# Patient Record
Sex: Female | Born: 2000 | Race: White | Hispanic: No | Marital: Single | Smoking: Never smoker
Health system: Southern US, Community
[De-identification: ages and names within clinical notes are randomized; demographics above are authoritative.]

## PROBLEM LIST (undated history)

## (undated) DIAGNOSIS — F419 Anxiety disorder, unspecified: Secondary | ICD-10-CM

## (undated) DIAGNOSIS — E669 Obesity, unspecified: Secondary | ICD-10-CM

## (undated) DIAGNOSIS — J309 Allergic rhinitis, unspecified: Secondary | ICD-10-CM

## (undated) DIAGNOSIS — K219 Gastro-esophageal reflux disease without esophagitis: Secondary | ICD-10-CM

## (undated) DIAGNOSIS — I499 Cardiac arrhythmia, unspecified: Secondary | ICD-10-CM

## (undated) HISTORY — DX: Gastro-esophageal reflux disease without esophagitis: K21.9

## (undated) HISTORY — DX: Obesity, unspecified: E66.9

## (undated) HISTORY — DX: Allergic rhinitis, unspecified: J30.9

## (undated) HISTORY — PX: NO PAST SURGERIES: SHX2092

## (undated) HISTORY — PX: ESOPHAGOGASTRODUODENOSCOPY: SHX1529

---
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
## 2019-02-17 ENCOUNTER — Ambulatory Visit: Payer: Medicaid Other | Admitting: Neurology

## 2019-03-03 ENCOUNTER — Ambulatory Visit (INDEPENDENT_AMBULATORY_CARE_PROVIDER_SITE_OTHER): Payer: Medicaid Other | Admitting: Neurology

## 2019-03-03 ENCOUNTER — Other Ambulatory Visit: Payer: Self-pay

## 2019-03-03 ENCOUNTER — Encounter: Payer: Self-pay | Admitting: Family Medicine

## 2019-03-03 ENCOUNTER — Encounter: Payer: Self-pay | Admitting: Neurology

## 2019-03-03 ENCOUNTER — Other Ambulatory Visit: Payer: Self-pay | Admitting: Family Medicine

## 2019-03-03 VITALS — BP 148/75 | HR 68 | Temp 97.9°F | Ht 63.0 in | Wt 260.0 lb

## 2019-03-03 DIAGNOSIS — F959 Tic disorder, unspecified: Secondary | ICD-10-CM | POA: Diagnosis not present

## 2019-03-03 DIAGNOSIS — F419 Anxiety disorder, unspecified: Secondary | ICD-10-CM | POA: Diagnosis not present

## 2019-03-03 MED ORDER — ARIPIPRAZOLE 5 MG PO TABS: 5.0000 mg | ORAL_TABLET | Freq: Every day | ORAL | 11 refills | Status: DC

## 2019-03-03 NOTE — Progress Notes (Signed)
PATIENT: Katelyn Aguilar DOB: 2000/04/09  Chief Complaint  Patient presents with  . Simple Tics/Anxiety    Feels her symptoms have improved since starting the Abilify. She has also started using a CBD vape (2000mg  daily).     HISTORICAL  Katelyn Aguilar is a 19 year old female, seen in request by her primary care physician Dr. 12 for evaluation of abnormal movement, initial evaluation was on November 17, 2018.  I have reviewed and summarized the referring note from the referring physician.  She reported intermittent involunteer body movement since 19 years old, initially it was frequent shivering-like movement, multiple episode in a day, she has no control over it, over the years,  The involuntary was replaced by forceful neck jerking either to the left or right side, she can suppress it for a while, then it will come back again, in addition, she complains of frequent headaches starting from occipital region, sometimes settled to right retro-orbital area, there is no light noise sensitivity, no nausea,  She does complains of anxiety, denies OCD, she was given muscle relaxant Robaxin 500 mg 4 times daily, which helps her some  Laboratory evaluation in 2020, negative HIV, BMP showed mild elevated glucose 104, normal CBC, negative UDS  Update March 03, 2019: She was diagnosed with possible Tourette's syndrome, was started on the Abilify 2 mg daily, which has helped her symptoms,  I personally reviewed MRI of the brain without contrast in November 2020 that was normal.  Laboratory evaluation in October 2020, mild elevated ALT 48, normal TSH, negative HIV, normal, BMP, CBC,  REVIEW OF SYSTEMS: Full 14 system review of systems performed and notable only for as above All other review of systems were negative.  ALLERGIES: No Known Allergies  HOME MEDICATIONS: Current Outpatient Medications  Medication Sig Dispense Refill  . acetaminophen (TYLENOL) 325 MG tablet Take 650 mg  by mouth every 6 (six) hours as needed.    . ARIPiprazole (ABILIFY) 2 MG tablet Take 1 tablet (2 mg total) by mouth daily. 30 tablet 11  . cetirizine (ZYRTEC) 10 MG tablet Take 1 tablet (10 mg total) by mouth daily. 30 tablet 11  . UNABLE TO FIND 2,000 mg daily. Med Name: CBD vape     No current facility-administered medications for this visit.    PAST MEDICAL HISTORY: History reviewed. No pertinent past medical history.  PAST SURGICAL HISTORY: Past Surgical History:  Procedure Laterality Date  . NO PAST SURGERIES      FAMILY HISTORY: Family History  Problem Relation Age of Onset  . COPD Mother   . Depression Mother     SOCIAL HISTORY: Social History   Socioeconomic History  . Marital status: Single    Spouse name: Not on file  . Number of children: 0  . Years of education: 27  . Highest education level: Not on file  Occupational History  . Occupation: Security job  Tobacco Use  . Smoking status: Passive Smoke Exposure - Never Smoker  . Smokeless tobacco: Never Used  Substance and Sexual Activity  . Alcohol use: No  . Drug use: No  . Sexual activity: Never  Other Topics Concern  . Not on file  Social History Narrative   Right handed    Lives with mother, step dad and brother (younger)   Caffeine use: sometimes   Social Determinants of Health   Financial Resource Strain:   . Difficulty of Paying Living Expenses: Not on file  Food Insecurity:   . Worried  About Running Out of Food in the Last Year: Not on file  . Ran Out of Food in the Last Year: Not on file  Transportation Needs:   . Lack of Transportation (Medical): Not on file  . Lack of Transportation (Non-Medical): Not on file  Physical Activity:   . Days of Exercise per Week: Not on file  . Minutes of Exercise per Session: Not on file  Stress:   . Feeling of Stress : Not on file  Social Connections:   . Frequency of Communication with Friends and Family: Not on file  . Frequency of Social  Gatherings with Friends and Family: Not on file  . Attends Religious Services: Not on file  . Active Member of Clubs or Organizations: Not on file  . Attends Banker Meetings: Not on file  . Marital Status: Not on file  Intimate Partner Violence:   . Fear of Current or Ex-Partner: Not on file  . Emotionally Abused: Not on file  . Physically Abused: Not on file  . Sexually Abused: Not on file     PHYSICAL EXAM   Vitals:   03/03/19 0834  BP: (!) 148/75  Pulse: 68  Temp: 97.9 F (36.6 C)  Weight: 260 lb (117.9 kg)  Height: 5\' 3"  (1.6 m)    Not recorded      Body mass index is 46.06 kg/m.  PHYSICAL EXAMNIATION:  Gen: NAD, conversant, well nourised, well groomed                     Cardiovascular: Regular rate rhythm, no peripheral edema, warm, nontender. Eyes: Conjunctivae clear without exudates or hemorrhage Neck: Supple, no carotid bruits. Pulmonary: Clear to auscultation bilaterally   NEUROLOGICAL EXAM:  MENTAL STATUS: Speech:    Speech is normal; fluent and spontaneous with normal comprehension.  Cognition:     Orientation to time, place and person     Normal recent and remote memory     Normal Attention span and concentration     Normal Language, naming, repeating,spontaneous speech     Fund of knowledge   CRANIAL NERVES: CN II: Visual fields are full to confrontation.  Pupils are round equal and briskly reactive to light. CN III, IV, VI: extraocular movement are normal. No ptosis. CN V: Facial sensation is intact to pinprick in all 3 divisions bilaterally. Corneal responses are intact.  CN VII: Face is symmetric with normal eye closure and smile. CN VIII: Hearing is normal to causal conversation. CN IX, X: Palate elevates symmetrically. Phonation is normal. CN XI: Head turning and shoulder shrug are intact CN XII: Tongue is midline with normal movements and no atrophy.  MOTOR: There is no pronator drift of out-stretched arms. Muscle bulk  and tone are normal. Muscle strength is normal.  REFLEXES: Reflexes are 2+ and symmetric at the biceps, triceps, knees, and ankles. Plantar responses are flexor.  SENSORY: Intact to light touch, pinprick, positional sensation and vibratory sensation are intact in fingers and toes.  COORDINATION: Rapid alternating movements and fine finger movements are intact. There is no dysmetria on finger-to-nose and heel-knee-shin.    GAIT/STANCE: Posture is normal. Gait is steady with normal steps, base, arm swing, and turning. Heel and toe walking are normal. Tandem gait is normal.  Romberg is absent.   DIAGNOSTIC DATA (LABS, IMAGING, TESTING) - I reviewed patient records, labs, notes, testing and imaging myself where available.   ASSESSMENT AND PLAN  Zariel Capano is a 19 y.o.  female    Motor tics Anxiety  Possible Tourette's syndrome  MRI of the brain was normal  Laboratory evaluation including TSH was normal  She responded well to Abilify, will titrate dose to 5 mg daily  Continue refill with her primary care physician  Marcial Pacas, M.D. Ph.D.  [facility] [address], [place], [place] [address] Ph: [phone] Fax: [phone]  CC: Zenia Resides, MD

## 2019-03-11 ENCOUNTER — Encounter: Payer: Self-pay | Admitting: Family Medicine

## 2019-03-16 ENCOUNTER — Other Ambulatory Visit: Payer: Self-pay | Admitting: Family Medicine

## 2019-03-16 DIAGNOSIS — T7840XD Allergy, unspecified, subsequent encounter: Secondary | ICD-10-CM

## 2019-03-16 NOTE — Progress Notes (Signed)
Patient requesting evaluation from allergy specialist

## 2019-03-31 ENCOUNTER — Encounter: Payer: Self-pay | Admitting: Family Medicine

## 2019-03-31 ENCOUNTER — Ambulatory Visit: Payer: Medicaid Other | Attending: Internal Medicine

## 2019-03-31 DIAGNOSIS — E669 Obesity, unspecified: Secondary | ICD-10-CM | POA: Diagnosis not present

## 2019-03-31 DIAGNOSIS — Z20822 Contact with and (suspected) exposure to covid-19: Secondary | ICD-10-CM | POA: Diagnosis not present

## 2019-03-31 DIAGNOSIS — B349 Viral infection, unspecified: Secondary | ICD-10-CM | POA: Diagnosis not present

## 2019-03-31 DIAGNOSIS — R112 Nausea with vomiting, unspecified: Secondary | ICD-10-CM | POA: Diagnosis not present

## 2019-03-31 DIAGNOSIS — R42 Dizziness and giddiness: Secondary | ICD-10-CM | POA: Diagnosis not present

## 2019-03-31 DIAGNOSIS — Z3202 Encounter for pregnancy test, result negative: Secondary | ICD-10-CM | POA: Diagnosis not present

## 2019-04-01 ENCOUNTER — Encounter: Payer: Self-pay | Admitting: Family Medicine

## 2019-04-01 LAB — NOVEL CORONAVIRUS, NAA: SARS-CoV-2, NAA: NOT DETECTED

## 2019-04-06 ENCOUNTER — Other Ambulatory Visit: Payer: Self-pay

## 2019-04-06 ENCOUNTER — Telehealth (INDEPENDENT_AMBULATORY_CARE_PROVIDER_SITE_OTHER): Payer: Medicaid Other | Admitting: Family Medicine

## 2019-04-06 DIAGNOSIS — R110 Nausea: Secondary | ICD-10-CM

## 2019-04-06 DIAGNOSIS — R112 Nausea with vomiting, unspecified: Secondary | ICD-10-CM

## 2019-04-06 HISTORY — DX: Nausea with vomiting, unspecified: R11.2

## 2019-04-06 NOTE — Progress Notes (Signed)
[facility]  Patient consented to have virtual visit. Method of visit: Video  Encounter participants: Patient: Katelyn Aguilar - located at [facility] parking lot in the car Provider: Lavonda Jumbo - located at [facility] Others (if applicable): Dr. Oda Cogan  Chief Complaint: Nausea and Vomiting  HPI: [name] is a 19 yo female that presents for Nausea and vomiting that has been occurring for months. She recently sought care at an urgent care 03/31/2019 for COVID symptoms as she also had cough and congestion. Her test was negative. There they suggested possible insulin resistance due to an exam finding of acanthosis nigrans. She was given Zofran that give her some relief especially on the weekends. Her last emesis episode was 04/01/2019 and was not brown, black, or green. Before emesis she has abdominal pain that lingers afterwards but not always. She does not currently have abdominal pain. With the nausea she occasionally has dizziness. Today she continues to have runny nose, cough, and congestion but attributes this to her allergies in which she takes Zyrtec for but did not take it this morning. She does not have associated diarrhea and does not believed she consumed any undercooked food. LMP 03/15/2019 she is sexually active and last UPT was 03/31/2019 and was negative. No personal history but family history of GERD. She does not believe the nausea is related to nervousness or being anxious.  ROS: per HPI  Pertinent PMHx: Tics, anxiety  Exam:  General: Appears well, no acute distress. Age appropriate. Respiratory: Speaking in full sentences.  Assessment/Plan:  Nausea Etiology unknown at this time but will consider infectious causes (viral gastroenteritis or COVID although low suspicion for both at this time), pregnancy (sexually active next menstrual period due 04/16/2019, recent negative UPT), psychiatric causes (history of anxiety, potential GI derangements  with increasing levels of anxiety), PUD/GERD (although no obvious association with food), endocrine causes (w/ possible acanthosis nigrans) -Scheduled for office visit next Monday 04/13/2019 -Follow up on symptom improvement -Consider A1c and/or other endorcrine labs -Assess GAD score -ED precautions given    Time spent during visit with patient: 25 minutes

## 2019-04-06 NOTE — Assessment & Plan Note (Addendum)
Etiology unknown at this time but will consider infectious causes (viral gastroenteritis or COVID although low suspicion for both at this time), pregnancy (sexually active next menstrual period due 04/16/2019, recent negative UPT), psychiatric causes (history of anxiety, potential GI derangements with increasing levels of anxiety), PUD/GERD (although no obvious association with food), endocrine causes (w/ possible acanthosis nigrans) -Scheduled for office visit next Monday 04/13/2019 -Follow up on symptom improvement -Consider A1c and/or other endorcrine labs -Assess GAD score -ED precautions given

## 2019-04-06 NOTE — Patient Instructions (Addendum)
It was very nice to meet you today. Please enjoy the rest of your week.   Continue the Zofran as needed. Go to the ED with increased belly pain or vomiting that is black, green, or bloody.  Your appointment is this Friday March 12 @ 9:10am with Dr. Darin Engels.  Please call the clinic at [phone] if your symptoms worsen or you have any concerns. It was our pleasure to serve you.

## 2019-04-10 ENCOUNTER — Encounter: Payer: Self-pay | Admitting: Family Medicine

## 2019-04-10 ENCOUNTER — Telehealth: Payer: Self-pay | Admitting: *Deleted

## 2019-04-10 ENCOUNTER — Ambulatory Visit (INDEPENDENT_AMBULATORY_CARE_PROVIDER_SITE_OTHER): Payer: Medicaid Other | Admitting: Family Medicine

## 2019-04-10 ENCOUNTER — Other Ambulatory Visit: Payer: Self-pay

## 2019-04-10 VITALS — BP 116/70 | HR 99 | Ht 63.0 in | Wt 275.4 lb

## 2019-04-10 DIAGNOSIS — R110 Nausea: Secondary | ICD-10-CM

## 2019-04-10 DIAGNOSIS — E669 Obesity, unspecified: Secondary | ICD-10-CM | POA: Diagnosis not present

## 2019-04-10 DIAGNOSIS — R252 Cramp and spasm: Secondary | ICD-10-CM | POA: Diagnosis not present

## 2019-04-10 LAB — POCT GLYCOSYLATED HEMOGLOBIN (HGB A1C): Hemoglobin A1C: 5.1 % (ref 4.0–5.6)

## 2019-04-10 LAB — POCT URINE PREGNANCY: Preg Test, Ur: NEGATIVE

## 2019-04-10 NOTE — Telephone Encounter (Signed)
Pt informed. Kona Yusuf, CMA  

## 2019-04-10 NOTE — Telephone Encounter (Signed)
-----   Message from Oralia Manis, DO sent at 04/10/2019 11:41 AM EST ----- Please inform patient that results of urine preg and A1C are negative.

## 2019-04-10 NOTE — Patient Instructions (Signed)
It was a pleasure seeing you today.   Today we discussed nausea & vomiting, cramps  For your medical concerns: I have obtained labs. I will either call or send a mychart message with the results. Please follow up in 2 weeks to make sure we do not need further workup and to see how your symptoms are doing.   Please follow up in 2 weeks or sooner if symptoms persist or worsen. Please call the clinic immediately if you have any concerns.   Our clinic's number is [phone]. Please call with questions or concerns.   Thank you,  Oralia Manis, DO

## 2019-04-10 NOTE — Assessment & Plan Note (Signed)
Unclear etiology at this time. Will obtain BMP to r/o electrolyte abnormality. Will obtain CBC to r/o anemia. Encouraged water intake. Advised conservative measures. F/u with PCP if no improvement.

## 2019-04-10 NOTE — Assessment & Plan Note (Signed)
Patient evaluated for nausea vomiting by both urgent care follow-up with Dr. Frederik Pear virtual visit.  Urine pregnancy test negative.  A1c within normal limits. Will obtain BMP to r/o metabolic pathology. Will also obtain CBC and TSH. Can consider GERD, may benefit from trial of PPI vs. H2 blocker. Can also consider psychogenic given h/o anxiety. Advised to f/u with PCP within 2 weeks.

## 2019-04-10 NOTE — Progress Notes (Signed)
    SUBJECTIVE:   CHIEF COMPLAINT / HPI:   Nausea/vomiting  Today for follow-up of nausea and vomiting.  States that she went to urgent care on 3/2 and was told that she has insulin resistance.  Was told to see her PCP ASAP if she might have diabetes versus PCOS.  Patient reports family history of diabetes in her maternal grandmother.  No other family members that she knows of.  Denies history of any pregnancy so no diagnosed with gestational diabetes.  Does eat lots of sweets.  Did check her sugar at home and it was 85 when she thought she was hypoglycemic.  States that sometimes she gets dizzy spells where she feels hypoglycemic, eats sweets, feels better afterwards.  Reports her nausea is improved.  Is concerned because she has a dark ring around her neck.  Does have increased facial hair, this is throughout her family.  Does have weight gain but states that she has stopped exercising as well.  She works very long hours and is unable to exercise.  Cramping Patient reports intermittent muscle cramping.  They occur "everywhere" they can be very random.  States that they are sometimes in her arms, toes, torso.  No inciting events that she knows of.  Has been occurring for the past few months.  Usually every day.  But can occur at random times.  Has tried to increase her banana intake, that did not help.  She mostly drinks water, at least 64 ounces a day.  Does sweat a lot from her previous job but is no longer working there.  Contraception Patient reports unprotected intercourse in the past 2 weeks.  Denies any use of birth control.  Only uses condoms.  States she is not interested in any birth control at this time.  PERTINENT  PMH / PSH: anxiety, involuntary movements, nausea  OBJECTIVE:   BP 116/70   Pulse 99   Ht 5\' 3"  (1.6 m)   Wt 275 lb 6.4 oz (124.9 kg)   LMP 04/09/2019 (Exact Date)   SpO2 97%   BMI 48.78 kg/m   Gen: awake and alert, NAD Cardio: RRR, no MRG Resp: CTAB, no wheezes,  rales, or rhonchi GI: soft, non tender, non distended  Ext: no edema   ASSESSMENT/PLAN:   Nausea Patient evaluated for nausea vomiting by both urgent care follow-up with Dr. 06/09/2019 virtual visit.  Urine pregnancy test negative.  A1c within normal limits. Will obtain BMP to r/o metabolic pathology. Will also obtain CBC and TSH. Can consider GERD, may benefit from trial of PPI vs. H2 blocker. Can also consider psychogenic given h/o anxiety. Advised to f/u with PCP within 2 weeks.   Cramp in limb Unclear etiology at this time. Will obtain BMP to r/o electrolyte abnormality. Will obtain CBC to r/o anemia. Encouraged water intake. Advised conservative measures. F/u with PCP if no improvement.      Frederik Pear, DO  PGY-3 [facility]

## 2019-04-11 LAB — BASIC METABOLIC PANEL
BUN/Creatinine Ratio: 19 (ref 9–23)
BUN: 14 mg/dL (ref 6–20)
CO2: 19 mmol/L — ABNORMAL LOW (ref 20–29)
Calcium: 9.4 mg/dL (ref 8.7–10.2)
Chloride: 102 mmol/L (ref 96–106)
Creatinine, Ser: 0.75 mg/dL (ref 0.57–1.00)
GFR calc Af Amer: 133 mL/min/{1.73_m2} (ref >59)
GFR calc non Af Amer: 115 mL/min/{1.73_m2} (ref >59)
Glucose: 95 mg/dL (ref 65–99)
Potassium: 4.4 mmol/L (ref 3.5–5.2)
Sodium: 139 mmol/L (ref 134–144)

## 2019-04-11 LAB — CBC
Hematocrit: 41.5 % (ref 34.0–46.6)
Hemoglobin: 13.4 g/dL (ref 11.1–15.9)
MCH: 29 pg (ref 26.6–33.0)
MCHC: 32.3 g/dL (ref 31.5–35.7)
MCV: 90 fL (ref 79–97)
Platelets: 281 10*3/uL (ref 150–450)
RBC: 4.62 x10E6/uL (ref 3.77–5.28)
RDW: 13 % (ref 11.7–15.4)
WBC: 9.2 10*3/uL (ref 3.4–10.8)

## 2019-04-11 LAB — TSH: TSH: 1.88 u[IU]/mL (ref 0.450–4.500)

## 2019-04-13 ENCOUNTER — Other Ambulatory Visit: Payer: Self-pay

## 2019-04-13 ENCOUNTER — Encounter: Payer: Self-pay | Admitting: Family Medicine

## 2019-04-13 ENCOUNTER — Emergency Department (HOSPITAL_COMMUNITY)
Admission: EM | Admit: 2019-04-13 | Discharge: 2019-04-14 | Disposition: A | Discharge status: Home / Self Care | Payer: Medicaid Other | Attending: Emergency Medicine | Admitting: Emergency Medicine

## 2019-04-13 ENCOUNTER — Ambulatory Visit: Payer: Medicaid Other | Admitting: Allergy

## 2019-04-13 ENCOUNTER — Ambulatory Visit (INDEPENDENT_AMBULATORY_CARE_PROVIDER_SITE_OTHER)
Admission: RE | Admit: 2019-04-13 | Discharge: 2019-04-13 | Disposition: A | Discharge status: Home / Self Care | Payer: Medicaid Other | Source: Ambulatory Visit

## 2019-04-13 ENCOUNTER — Other Ambulatory Visit: Payer: Self-pay | Admitting: Family Medicine

## 2019-04-13 ENCOUNTER — Encounter (HOSPITAL_COMMUNITY): Payer: Self-pay | Admitting: Emergency Medicine

## 2019-04-13 DIAGNOSIS — R110 Nausea: Secondary | ICD-10-CM | POA: Insufficient documentation

## 2019-04-13 DIAGNOSIS — Z5321 Procedure and treatment not carried out due to patient leaving prior to being seen by health care provider: Secondary | ICD-10-CM | POA: Diagnosis not present

## 2019-04-13 DIAGNOSIS — R42 Dizziness and giddiness: Secondary | ICD-10-CM

## 2019-04-13 DIAGNOSIS — R112 Nausea with vomiting, unspecified: Secondary | ICD-10-CM

## 2019-04-13 LAB — URINALYSIS, ROUTINE W REFLEX MICROSCOPIC
Bacteria, UA: NONE SEEN
Bilirubin Urine: NEGATIVE
Glucose, UA: NEGATIVE mg/dL
Ketones, ur: NEGATIVE mg/dL
Nitrite: NEGATIVE
Protein, ur: NEGATIVE mg/dL
Specific Gravity, Urine: 1.029 (ref 1.005–1.030)
pH: 5 (ref 5.0–8.0)

## 2019-04-13 LAB — CBC
HCT: 41.2 % (ref 36.0–46.0)
Hemoglobin: 13.3 g/dL (ref 12.0–15.0)
MCH: 28.7 pg (ref 26.0–34.0)
MCHC: 32.3 g/dL (ref 30.0–36.0)
MCV: 89 fL (ref 80.0–100.0)
Platelets: 265 10*3/uL (ref 150–400)
RBC: 4.63 MIL/uL (ref 3.87–5.11)
RDW: 12.9 % (ref 11.5–15.5)
WBC: 10.4 10*3/uL (ref 4.0–10.5)
nRBC: 0 % (ref 0.0–0.2)

## 2019-04-13 LAB — COMPREHENSIVE METABOLIC PANEL
ALT: 38 U/L (ref 0–44)
AST: 29 U/L (ref 15–41)
Albumin: 4.2 g/dL (ref 3.5–5.0)
Alkaline Phosphatase: 63 U/L (ref 38–126)
Anion gap: 11 (ref 5–15)
BUN: 10 mg/dL (ref 6–20)
CO2: 26 mmol/L (ref 22–32)
Calcium: 9.2 mg/dL (ref 8.9–10.3)
Chloride: 104 mmol/L (ref 98–111)
Creatinine, Ser: 0.7 mg/dL (ref 0.44–1.00)
GFR calc Af Amer: >60 mL/min (ref >60)
GFR calc non Af Amer: >60 mL/min (ref >60)
Glucose, Bld: 94 mg/dL (ref 70–99)
Potassium: 3.7 mmol/L (ref 3.5–5.1)
Sodium: 141 mmol/L (ref 135–145)
Total Bilirubin: 0.9 mg/dL (ref 0.3–1.2)
Total Protein: 7.1 g/dL (ref 6.5–8.1)

## 2019-04-13 LAB — I-STAT BETA HCG BLOOD, ED (MC, WL, AP ONLY): I-stat hCG, quantitative: <5 m[IU]/mL (ref <5)

## 2019-04-13 LAB — LIPASE, BLOOD: Lipase: 23 U/L (ref 11–51)

## 2019-04-13 MED ORDER — ONDANSETRON HCL 4 MG PO TABS: 4.0000 mg | ORAL_TABLET | Freq: Three times a day (TID) | ORAL | 0 refills | Status: DC | PRN

## 2019-04-13 MED ORDER — SODIUM CHLORIDE 0.9% FLUSH: 3.0000 mL | Freq: Once | INTRAVENOUS | Status: DC

## 2019-04-13 NOTE — Progress Notes (Signed)
Patient still having nausea. Will refill zofran and patient has appointment to see me on 3/23. She may come in earlier to have an evaluation for dizziness.

## 2019-04-13 NOTE — Discharge Instructions (Addendum)
Your symptoms need to be evaluated in person.  Come to the Urgent Care or follow up with your primary care provider for an in-person visit.

## 2019-04-13 NOTE — ED Triage Notes (Signed)
Patient with dizziness, nausea and vomiting.  Patient states the nausea started yesterday, nausea and vomiting started today.

## 2019-04-13 NOTE — ED Provider Notes (Signed)
Virtual Visit via Video Note:  Katelyn Aguilar  initiated request for Telemedicine visit with [facility] team. I connected with Katelyn Aguilar  on 04/13/2019 at 3:43 PM  for a synchronized telemedicine visit using a video enabled HIPPA compliant telemedicine application. I verified that I am speaking with Katelyn Aguilar  using two identifiers. Mickie Bail, NP  was physically located in a [facility]gent care site and Locklyn Henriquez was located at a different location.   The limitations of evaluation and management by telemedicine as well as the availability of in-person appointments were discussed. Patient was informed that she  may incur a bill ( including co-pay) for this virtual visit encounter. Dorthie Santini  expressed understanding and gave verbal consent to proceed with virtual visit.     History of Present Illness:Katelyn Aguilar  is a 19 y.o. female presents for evaluation of dizziness since 7 am today.  She also reports nausea, vomiting, and feeling "shaky".  She states the dizziness feels like the room is "tettering back and forth".  Two episodes of emesis today.  She denies fever, chills, cough, shortness of breath, diarrhea, rash, or other symptoms.  Treatment attempted at home with Zofran x 1 dose this morning; she states this did not help her symptoms.  Patient states she has history of intermittent dizziness and has been seen by her PCP for this.     No Known Allergies   History reviewed. No pertinent past medical history.   Social History   Tobacco Use  . Smoking status: Passive Smoke Exposure - Never Smoker  . Smokeless tobacco: Never Used  Substance Use Topics  . Alcohol use: No  . Drug use: No    ROS: as stated in HPI.  All other systems reviewed and negative.      Observations/Objective: Physical Exam  VITALS: Patient denies fever. GENERAL: Alert, appears well and in no acute distress. HEENT: Atraumatic. NECK: Normal movements of the head and  neck. CARDIOPULMONARY: No increased WOB. Speaking in clear sentences. I:E ratio WNL.  MS: Moves all visible extremities without noticeable abnormality. PSYCH: Pleasant and cooperative, well-groomed. Speech normal rate and rhythm. Affect is appropriate. Insight and judgement are appropriate. Attention is focused, linear, and appropriate.  NEURO: CN grossly intact. Oriented as arrived to appointment on time with no prompting. Moves both UE equally.  SKIN: No obvious lesions, wounds, erythema, or cyanosis noted on face or hands.   Assessment and Plan:    ICD-10-CM   1. Dizziness  R42   2. Non-intractable vomiting with nausea, unspecified vomiting type  R11.2        Follow Up Instructions: Discussed with patient that her symptoms need to be evaluated in person.  Instructed her to come to the urgent care or follow-up with her PCP for an in person visit.  Patient agrees to plan of care.      I discussed the assessment and treatment plan with the patient. The patient was provided an opportunity to ask questions and all were answered. The patient agreed with the plan and demonstrated an understanding of the instructions.   The patient was advised to call back or seek an in-person evaluation if the symptoms worsen or if the condition fails to improve as anticipated.      Mickie Bail, NP  04/13/2019 3:43 PM         Mickie Bail, NP 04/13/19 [phone]

## 2019-04-14 NOTE — ED Notes (Signed)
Pt stated that she was leaving  

## 2019-04-21 ENCOUNTER — Other Ambulatory Visit: Payer: Self-pay

## 2019-04-21 ENCOUNTER — Telehealth (INDEPENDENT_AMBULATORY_CARE_PROVIDER_SITE_OTHER): Payer: Medicaid Other | Admitting: Family Medicine

## 2019-04-21 DIAGNOSIS — R42 Dizziness and giddiness: Secondary | ICD-10-CM

## 2019-04-21 DIAGNOSIS — F419 Anxiety disorder, unspecified: Secondary | ICD-10-CM | POA: Diagnosis not present

## 2019-04-21 MED ORDER — SERTRALINE HCL 25 MG PO TABS: 25.0000 mg | ORAL_TABLET | Freq: Every day | ORAL | 0 refills | Status: DC

## 2019-04-22 DIAGNOSIS — R42 Dizziness and giddiness: Secondary | ICD-10-CM | POA: Insufficient documentation

## 2019-04-22 NOTE — Progress Notes (Signed)
[facility] Telemedicine Visit  Patient consented to have virtual visit. Method of visit: Video was attempted, but technology challenges prevented patient from using video, so visit was conducted via telephone.  Encounter participants: Patient: Katelyn Aguilar - located at home in [facility] Provider: Arlyce Harman - located at [facility] Others (if applicable): none  Chief Complaint: Dizziness with associated nausea and vomiting  HPI:  Patient is a 19y/o female with developing episodes of what she describes as "dizziness". She states her episodes "feel like the room is spinning" but she is standing still and not moving. They occur randomly and nothing seems to make it better, it just goes away on its own. When they occur moving seems to make it worse so she will lay down until the episode passes. During these episodes she has associated nausea and NBNB vomiting. These resolve with the resolution of her dizziness. Its occurring about once a week now and has been ongoing for a few months. She went to the ED a week ago but her symptoms improved after waiting for several hours so she left. She does endorse stress related to having to recently quit her job and is looking for a new job. She is concerned her anxiety may be playing a role in her symptoms.   ROS: per HPI  Pertinent PMHx: Possible Tourette's Syndrome  Exam:  Respiratory: speaks in full sentences easily  Assessment/Plan:  Dizziness Patient with symptoms consistent with vertigo. She has been seen recently by Neurology and started on Abilify for possible Tourette's syndrome and states she is tolerating the medication well and it has helped her tics. She had extensive workup with Neurology and her brain MRI was normal with a normal Neurological exam last time I saw her in person and a normal exam by Neurology except a positive Rhomberg. Unclear etiology at this point. I did let patient know to come in person so we can do an  evaluation.  Anxiety I don't think her anxiety is causing this dizziness with nausea and vomiting but just from speaking with the patient it does appear her anxiety is not well controlled and this has been an ongoing issue for the past few months. Seems to be situational and job related. She is agreeable to a trial of medication to help. - Start sertraline 25mg  daily for one week, then increase to 50mg  daily for one week then follow up with me in clinic in person - Administer GAD-7 at in person visit    Time spent during visit with patient: >25 minutes  , DO [facility], PGY-3

## 2019-04-22 NOTE — Assessment & Plan Note (Addendum)
I don't think her anxiety is causing this dizziness with nausea and vomiting but just from speaking with the patient it does appear her anxiety is not well controlled and this has been an ongoing issue for the past few months. Seems to be situational and job related. She is agreeable to a trial of medication to help. - Start sertraline 25mg  daily for one week, then increase to 50mg  daily for one week then follow up with me in clinic in person - Administer GAD-7 at in person visit

## 2019-04-22 NOTE — Assessment & Plan Note (Signed)
Patient with symptoms consistent with vertigo. She has been seen recently by Neurology and started on Abilify for possible Tourette's syndrome and states she is tolerating the medication well and it has helped her tics. She had extensive workup with Neurology and her brain MRI was normal with a normal Neurological exam last time I saw her in person and a normal exam by Neurology except a positive Rhomberg. Unclear etiology at this point. I did let patient know to come in person so we can do an evaluation.

## 2019-04-27 ENCOUNTER — Ambulatory Visit: Payer: Medicaid Other | Admitting: Family Medicine

## 2019-04-29 ENCOUNTER — Encounter: Payer: Self-pay | Admitting: Family Medicine

## 2019-04-30 ENCOUNTER — Other Ambulatory Visit: Payer: Self-pay | Admitting: Family Medicine

## 2019-04-30 MED ORDER — SERTRALINE HCL 50 MG PO TABS: 50.0000 mg | ORAL_TABLET | Freq: Every day | ORAL | 1 refills | Status: DC

## 2019-04-30 NOTE — Progress Notes (Signed)
Patient has appointment on 05/12/2019 but only enough to last her the next week. Sending in more to last to her appointment.

## 2019-05-12 ENCOUNTER — Other Ambulatory Visit: Payer: Self-pay

## 2019-05-12 ENCOUNTER — Encounter: Payer: Self-pay | Admitting: Family Medicine

## 2019-05-12 ENCOUNTER — Ambulatory Visit (INDEPENDENT_AMBULATORY_CARE_PROVIDER_SITE_OTHER): Payer: Medicaid Other | Admitting: Family Medicine

## 2019-05-12 DIAGNOSIS — F419 Anxiety disorder, unspecified: Secondary | ICD-10-CM | POA: Diagnosis not present

## 2019-05-12 MED ORDER — ONDANSETRON HCL 4 MG PO TABS: 4.0000 mg | ORAL_TABLET | Freq: Three times a day (TID) | ORAL | 0 refills | Status: DC | PRN

## 2019-05-12 MED ORDER — SERTRALINE HCL 50 MG PO TABS: 50.0000 mg | ORAL_TABLET | Freq: Every day | ORAL | 3 refills | Status: DC

## 2019-05-12 NOTE — Progress Notes (Addendum)
    SUBJECTIVE:   CHIEF COMPLAINT / HPI:   Anxiety/Nausea - Started Sertraline 3 weeks ago. Has been on 50mg  dose for 2 weeks. She says the Sertraline has helped but has not completely resolved her anxiety. She started working again about 1 week ago and noted that her anxiety is worse at work and still occasionally tied to nausea. She states that her nausea only comes on with "extreme anxiety." She has vomited a couple times. Vomitus was filled with food and mucus. She denies any abdominal pain associated with nausea/vomitting. The Sertraline started working for her immediately. It has caused her to feel overly tired though she feels this is manageable. She does not endorse a decrease in libido. She uses CBD vape on a daily basis, but has noticed decreased need for this since starting sertraline. However she notes that she would like to continue using it to help further improve her anxiety. Counseled patient on the dangers of vaping.   PERTINENT  PMH / PSH: anxiety, nausea, tics, dizziness  OBJECTIVE:   BP 128/72   Pulse 86   Ht 5\' 3"  (1.6 m)   Wt 281 lb 3.2 oz (127.6 kg)   SpO2 98%   BMI 49.81 kg/m   Physical Exam  Constitutional: She is well-developed, well-nourished, and in no distress.  Appearing slightly anxious  Cardiovascular: Normal rate, regular rhythm and normal heart sounds.  Pulmonary/Chest: Effort normal and breath sounds normal.  Psych: appropriate behavior, stable mood, normal appearance   ASSESSMENT/PLAN:   Anxiety We discussed the options for continuing on current dose of Sertraline and seeing how she does, or increasing the dose. She opted to stay on 50mg  dose for now due to the exhaustion she has experienced on this medication. Additionally, will send in a refill of Zofran to help with the nausea. Will check back in with her next week to see how she is doing   , Medical Student [facility]   Resident Attestation   I  saw and evaluated the patient, performing the key elements of the service. I personally performed or re-performed the history, physical exam, and medical decision making activities of this service and have verified that the service and findings are accurately documented in the student's note. I developed the management plan that is described in the student's note, and I agree with the content, with my edits above in red.   , DO [facility], PGY-3

## 2019-05-12 NOTE — Patient Instructions (Signed)
It was great to see you today! Thank you for letting me participate in your care!  Today, we discussed your anxiety and I am so glad it is getting better and the side effect of nausea from your recent increase in sertraline has gone away. Please continue taking 50mg  once per day. Call me in one week to ensure you are still doing well and your anxiety is under control. If it is I will see you in two months but if you are not doing well I will see you sooner.  Be well, , DO PGY-3, [facility]

## 2019-05-12 NOTE — Assessment & Plan Note (Addendum)
We discussed the options for continuing on current dose of Sertraline and seeing how she does, or increasing the dose. She opted to stay on 50mg  dose for now due to the exhaustion she has experienced on this medication. Additionally, will send in a refill of Zofran to help with the nausea. Will check back in with her next week to see how she is doing

## 2019-05-13 NOTE — Progress Notes (Signed)
    SUBJECTIVE:   CHIEF COMPLAINT / HPI:   Anxiety/Nausea - Started Sertraline 3 weeks ago. Has been on 50mg  dose for 2 weeks. She says the Sertraline has helped but has not completely resolved her anxiety. She started working again about 1 week ago and noted that her anxiety is worse at work and still occasionally tied to nausea. She states that her nausea only comes on with "extreme anxiety." She has vomited a couple times. Vomitus was filled with food and mucus. She denies any abdominal pain associated with nausea/vomitting. The Sertraline started working for her immediately. It has caused her to feel overly tired though she feels this is manageable. She does not endorse a decrease in libido. She uses CBD vape on a daily basis, but has noticed decreased need for this since starting sertraline. However she notes that she would like to continue using it to help further improve her anxiety. Counseled patient on the dangers of vaping.   PERTINENT  PMH / PSH: anxiety, nausea, tics, dizziness  OBJECTIVE:   BP 128/72   Pulse 86   Ht 5\' 3"  (1.6 m)   Wt 281 lb 3.2 oz (127.6 kg)   SpO2 98%   BMI 49.81 kg/m   Physical Exam  Constitutional: She is well-developed, well-nourished, and in no distress.  Appearing slightly anxious  Cardiovascular: Normal rate, regular rhythm and normal heart sounds.  Pulmonary/Chest: Effort normal and breath sounds normal.  Psych: appropriate behavior, stable mood, normal appearance   ASSESSMENT/PLAN:   Anxiety We discussed the options for continuing on current dose of Sertraline and seeing how she does, or increasing the dose. She opted to stay on 50mg  dose for now due to the exhaustion she has experienced on this medication. Additionally, will send in a refill of Zofran to help with the nausea. Will check back in with her next week to see how she is doing   , DO [facility]   Resident Attestation   I saw and evaluated  the patient, performing the key elements of the service. I personally performed or re-performed the history, physical exam, and medical decision making activities of this service and have verified that the service and findings are accurately documented in the student's note. I developed the management plan that is described in the student's note, and I agree with the content, with my edits above in red.   , DO [facility], PGY-3

## 2019-05-21 ENCOUNTER — Encounter: Payer: Self-pay | Admitting: Family Medicine

## 2019-05-25 ENCOUNTER — Encounter: Payer: Self-pay | Admitting: Family Medicine

## 2019-06-06 DIAGNOSIS — Z79899 Other long term (current) drug therapy: Secondary | ICD-10-CM | POA: Diagnosis not present

## 2019-06-06 DIAGNOSIS — F419 Anxiety disorder, unspecified: Secondary | ICD-10-CM | POA: Diagnosis not present

## 2019-06-06 DIAGNOSIS — L0501 Pilonidal cyst with abscess: Secondary | ICD-10-CM | POA: Diagnosis not present

## 2019-06-06 DIAGNOSIS — L539 Erythematous condition, unspecified: Secondary | ICD-10-CM | POA: Diagnosis not present

## 2019-06-08 DIAGNOSIS — Z79899 Other long term (current) drug therapy: Secondary | ICD-10-CM | POA: Diagnosis not present

## 2019-06-08 DIAGNOSIS — F419 Anxiety disorder, unspecified: Secondary | ICD-10-CM | POA: Diagnosis not present

## 2019-06-08 DIAGNOSIS — Z4800 Encounter for change or removal of nonsurgical wound dressing: Secondary | ICD-10-CM | POA: Diagnosis not present

## 2019-06-08 DIAGNOSIS — F17290 Nicotine dependence, other tobacco product, uncomplicated: Secondary | ICD-10-CM | POA: Diagnosis not present

## 2019-07-07 ENCOUNTER — Telehealth: Payer: Self-pay | Admitting: *Deleted

## 2019-07-07 ENCOUNTER — Encounter: Payer: Self-pay | Admitting: Family Medicine

## 2019-07-07 NOTE — Telephone Encounter (Signed)
PA for aripiprazole completed over the phone with [employer] [employer].  Pt CH#[id number] M.  YO#[id number]. Approved through 07/01/2020. Patient and pharmacy have been notified.

## 2019-07-30 DIAGNOSIS — Z419 Encounter for procedure for purposes other than remedying health state, unspecified: Secondary | ICD-10-CM | POA: Diagnosis not present

## 2019-08-30 DIAGNOSIS — Z419 Encounter for procedure for purposes other than remedying health state, unspecified: Secondary | ICD-10-CM | POA: Diagnosis not present

## 2019-09-07 NOTE — Telephone Encounter (Signed)
Please call patient to book an appointment  Katelyn Allan, MD [facility]

## 2019-09-09 ENCOUNTER — Other Ambulatory Visit: Payer: Self-pay

## 2019-09-09 ENCOUNTER — Ambulatory Visit (HOSPITAL_COMMUNITY)
Admission: EM | Admit: 2019-09-09 | Discharge: 2019-09-09 | Disposition: A | Discharge status: Home / Self Care | Payer: Medicaid Other

## 2019-09-09 ENCOUNTER — Encounter (HOSPITAL_COMMUNITY): Payer: Self-pay

## 2019-09-09 DIAGNOSIS — R1110 Vomiting, unspecified: Secondary | ICD-10-CM

## 2019-09-09 DIAGNOSIS — Z3202 Encounter for pregnancy test, result negative: Secondary | ICD-10-CM | POA: Diagnosis not present

## 2019-09-09 DIAGNOSIS — R1084 Generalized abdominal pain: Secondary | ICD-10-CM

## 2019-09-09 LAB — POCT URINALYSIS DIPSTICK, ED / UC
Bilirubin Urine: NEGATIVE
Glucose, UA: NEGATIVE mg/dL
Hgb urine dipstick: NEGATIVE
Ketones, ur: NEGATIVE mg/dL
Leukocytes,Ua: NEGATIVE
Nitrite: NEGATIVE
Protein, ur: NEGATIVE mg/dL
Specific Gravity, Urine: >=1.03 (ref 1.005–1.030)
Urobilinogen, UA: 0.2 mg/dL (ref 0.0–1.0)
pH: 5.5 (ref 5.0–8.0)

## 2019-09-09 LAB — POC URINE PREG, ED: Preg Test, Ur: NEGATIVE

## 2019-09-09 MED ORDER — FAMOTIDINE 20 MG PO TABS: 20.0000 mg | ORAL_TABLET | Freq: Two times a day (BID) | ORAL | 1 refills | Status: DC

## 2019-09-09 NOTE — ED Triage Notes (Signed)
Pt presents with abdominal pain x 1 day. Pian is worse after eating. Denies diarrhea, fever, chills.

## 2019-09-09 NOTE — ED Provider Notes (Signed)
[facility]    CSN: [id number] Arrival date & time: 09/09/19  1004      History   Chief Complaint Chief Complaint  Patient presents with  . Abdominal Pain    HPI Katelyn Aguilar is a 19 y.o. female.   The history is provided by the patient. No language interpreter was used.  Abdominal Pain Pain location:  Generalized Pain quality: aching   Pain radiates to:  Does not radiate Pain severity:  Moderate Onset quality:  Gradual Timing:  Constant Progression:  Unchanged Chronicity:  Recurrent Relieved by:  Nothing Worsened by:  Nothing Ineffective treatments:  None tried Associated symptoms: no fever   Pt complains of abdominal cramping.  Pt reports she has nausea every morning.  Pt has been on nausea medicine.   History reviewed. No pertinent past medical history.  Patient Active Problem List   Diagnosis Date Noted  . Dizziness 04/22/2019  . Cramp in limb 04/10/2019  . Nausea 04/06/2019  . Simple tics 11/17/2018  . Anxiety 11/17/2018  . Involuntary movements 10/21/2018  . Encounter for birth control 10/21/2018    Past Surgical History:  Procedure Laterality Date  . NO PAST SURGERIES      OB History   No obstetric history on file.      Home Medications    Prior to Admission medications   Medication Sig Start Date End Date Taking? Authorizing Provider  acetaminophen (TYLENOL) 325 MG tablet Take 650 mg by mouth every 6 (six) hours as needed.    [provider]  ARIPiprazole (ABILIFY) 5 MG tablet Take 1 tablet (5 mg total) by mouth daily. 03/03/19   Levert Feinstein, MD  ARIPiprazole (ABILIFY) 5 MG tablet Take by mouth.    [provider]  cetirizine (ZYRTEC) 10 MG tablet Take 1 tablet (10 mg total) by mouth daily. 10/20/18   Lockamy, Marcial Pacas, DO  ondansetron (ZOFRAN) 4 MG tablet Take 1 tablet (4 mg total) by mouth every 8 (eight) hours as needed for nausea or vomiting. 05/12/19   Arlyce Harman, DO  sertraline (ZOLOFT) 50 MG tablet Take  1 tablet (50 mg total) by mouth daily. 05/12/19   Lockamy, Timothy, DO  UNABLE TO FIND 2,000 mg daily. Med Name: CBD vape    [provider]    Family History Family History  Problem Relation Age of Onset  . COPD Mother   . Depression Mother     Social History Social History   Tobacco Use  . Smoking status: Passive Smoke Exposure - Never Smoker  . Smokeless tobacco: Never Used  Substance Use Topics  . Alcohol use: No  . Drug use: No     Allergies   Patient has no known allergies.   Review of Systems Review of Systems  Constitutional: Negative for fever.  Gastrointestinal: Positive for abdominal pain.  All other systems reviewed and are negative.    Physical Exam Triage Vital Signs ED Triage Vitals  Enc Vitals Group     BP 09/09/19 1121 119/69     Pulse Rate 09/09/19 1121 (!) 106     Resp --      Temp 09/09/19 1121 98.3 F (36.8 C)     Temp Source 09/09/19 1121 Oral     SpO2 09/09/19 1121 96 %     Weight --      Height --      Head Circumference --      Peak Flow --      Pain Score  09/09/19 1119 5     Pain Loc --      Pain Edu? --      Excl. in GC? --    No data found.  Updated Vital Signs BP 119/69 (BP Location: Right Arm)   Pulse (!) 106   Temp 98.3 F (36.8 C) (Oral)   SpO2 96%   Visual Acuity Right Eye Distance:   Left Eye Distance:   Bilateral Distance:    Right Eye Near:   Left Eye Near:    Bilateral Near:     Physical Exam Vitals and nursing note reviewed.  Constitutional:      Appearance: She is well-developed.  HENT:     Head: Normocephalic.  Cardiovascular:     Rate and Rhythm: Normal rate.  Pulmonary:     Effort: Pulmonary effort is normal.  Abdominal:     General: Abdomen is flat. Bowel sounds are normal. There is no distension.     Palpations: Abdomen is soft.     Tenderness: There is abdominal tenderness.  Musculoskeletal:        General: Normal range of motion.     Cervical back: Normal range of motion.    Skin:    General: Skin is warm.  Neurological:     General: No focal deficit present.     Mental Status: She is alert and oriented to person, place, and time.  Psychiatric:        Mood and Affect: Mood normal.      UC Treatments / Results  Labs (all labs ordered are listed, but only abnormal results are displayed) Labs Reviewed  POCT URINALYSIS DIPSTICK, ED / UC  POC URINE PREG, ED    EKG   Radiology No results found.  Procedures Procedures (including critical care time)  Medications Ordered in UC Medications - No data to display  Initial Impression / Assessment and Plan / UC Course  I have reviewed the triage vital signs and the nursing notes.  Pertinent labs & imaging results that were available during my care of the patient were reviewed by me and considered in my medical decision making (see chart for details).      Final Clinical Impressions(s) / UC Diagnoses   Final diagnoses:  Vomiting, intractability of vomiting not specified, presence of nausea not specified, unspecified vomiting type  Generalized abdominal pain     Discharge Instructions     Return if any problems.     ED Prescriptions    Medication Sig Dispense Auth. Provider   famotidine (PEPCID) 20 MG tablet Take 1 tablet (20 mg total) by mouth 2 (two) times daily. 60 tablet Elson Areas, [place]     PDMP not reviewed this encounter.  An After Visit Summary was printed and given to the patient.    Elson Areas, [place] 09/09/19 1217

## 2019-09-09 NOTE — Discharge Instructions (Signed)
Return if any problems.

## 2019-09-10 ENCOUNTER — Other Ambulatory Visit: Payer: Self-pay

## 2019-09-10 ENCOUNTER — Encounter (HOSPITAL_COMMUNITY): Payer: Self-pay

## 2019-09-10 ENCOUNTER — Ambulatory Visit (HOSPITAL_COMMUNITY)
Admission: EM | Admit: 2019-09-10 | Discharge: 2019-09-10 | Disposition: A | Discharge status: Home / Self Care | Payer: Medicaid Other

## 2019-09-10 DIAGNOSIS — R1030 Lower abdominal pain, unspecified: Secondary | ICD-10-CM | POA: Diagnosis not present

## 2019-09-10 NOTE — Discharge Instructions (Addendum)
This is not a reaction to the medication you are taking.  You can take the medication as needed do not exceed twice a day.  The Zofran as needed. Believe you may have some constipation.  I would try doing MiraLAX daily for the next few days and see if this helps your  symptoms. If your abdominal pain worsens please follow-up or go to the ER

## 2019-09-10 NOTE — ED Provider Notes (Signed)
[facility]    CSN: [id number] Arrival date & time: 09/10/19  0831      History   Chief Complaint Chief Complaint  Patient presents with  . Abdominal Pain    HPI Katelyn Aguilar is a 19 y.o. female.   Patient is a 19 year old female presents today with lower abdominal pain.  Started this morning after having a second bowel movement.  Pain is now resolved.  Concerned that this is possible allergic reaction to the Pepcid that she was prescribed yesterday for acid reflux.  She took 2 doses yesterday and 1 this morning.  Worried that the medication is reacting with her other medicines.  Pain is decreased when sitting but felt the pain when riding in the car.  Reporting bowel movements are normal in color and consistency.  No blood in stool.  Denies fever, chills, nausea, vomiting.     History reviewed. No pertinent past medical history.  Patient Active Problem List   Diagnosis Date Noted  . Dizziness 04/22/2019  . Cramp in limb 04/10/2019  . Nausea 04/06/2019  . Simple tics 11/17/2018  . Anxiety 11/17/2018  . Involuntary movements 10/21/2018  . Encounter for birth control 10/21/2018    Past Surgical History:  Procedure Laterality Date  . NO PAST SURGERIES      OB History   No obstetric history on file.      Home Medications    Prior to Admission medications   Medication Sig Start Date End Date Taking? Authorizing Provider  ARIPiprazole (ABILIFY) 5 MG tablet Take by mouth.   Yes [provider]  cetirizine (ZYRTEC) 10 MG tablet Take 1 tablet (10 mg total) by mouth daily. 10/20/18  Yes Lockamy, Timothy, DO  famotidine (PEPCID) 20 MG tablet Take 1 tablet (20 mg total) by mouth 2 (two) times daily. 09/09/19 09/08/20 Yes Cheron Schaumann K, PA-C  ondansetron (ZOFRAN) 4 MG tablet Take 1 tablet (4 mg total) by mouth every 8 (eight) hours as needed for nausea or vomiting. 05/12/19  Yes Lockamy, Timothy, DO  sertraline (ZOLOFT) 50 MG tablet Take 1 tablet (50 mg  total) by mouth daily. 05/12/19  Yes Lockamy, Timothy, DO  sertraline (ZOLOFT) 50 MG tablet Take by mouth.   Yes [provider]  acetaminophen (TYLENOL) 325 MG tablet Take 650 mg by mouth every 6 (six) hours as needed.    [provider]  ARIPiprazole (ABILIFY) 5 MG tablet Take 1 tablet (5 mg total) by mouth daily. 03/03/19   Levert Feinstein, MD  UNABLE TO FIND 2,000 mg daily. Med Name: CBD vape    [provider]    Family History Family History  Problem Relation Age of Onset  . COPD Mother   . Depression Mother     Social History Social History   Tobacco Use  . Smoking status: Passive Smoke Exposure - Never Smoker  . Smokeless tobacco: Never Used  Substance Use Topics  . Alcohol use: No  . Drug use: No     Allergies   Patient has no known allergies.   Review of Systems Review of Systems   Physical Exam Triage Vital Signs ED Triage Vitals  Enc Vitals Group     BP 09/10/19 0846 (!) 139/105     Pulse Rate 09/10/19 0846 (!) 102     Resp 09/10/19 0846 18     Temp 09/10/19 0846 98.4 F (36.9 C)     Temp Source 09/10/19 0846 Oral     SpO2 09/10/19  0846 100 %     Weight --      Height --      Head Circumference --      Peak Flow --      Pain Score 09/10/19 0849 0     Pain Loc --      Pain Edu? --      Excl. in GC? --    No data found.  Updated Vital Signs BP (!) 139/105 (BP Location: Left Wrist)   Pulse (!) 102   Temp 98.4 F (36.9 C) (Oral)   Resp 18   LMP 08/19/2019   SpO2 100%   Visual Acuity Right Eye Distance:   Left Eye Distance:   Bilateral Distance:    Right Eye Near:   Left Eye Near:    Bilateral Near:     Physical Exam Vitals and nursing note reviewed.  Constitutional:      General: She is not in acute distress.    Appearance: She is obese. She is not ill-appearing, toxic-appearing or diaphoretic.  HENT:     Head: Normocephalic and atraumatic.  Cardiovascular:     Rate and Rhythm: Normal rate and regular rhythm.      Heart sounds: Normal heart sounds.  Pulmonary:     Effort: Pulmonary effort is normal.     Breath sounds: Normal breath sounds.  Abdominal:     Palpations: Abdomen is soft.     Tenderness: There is abdominal tenderness.     Comments: Tenderness in right mid abdomen upon palpation.  Palpated for the second time and pain had resolved.  No right lower quadrant pain, rebound tenderness.   Neurological:     Mental Status: She is alert.      UC Treatments / Results  Labs (all labs ordered are listed, but only abnormal results are displayed) Labs Reviewed - No data to display  EKG   Radiology No results found.  Procedures Procedures (including critical care time)  Medications Ordered in UC Medications - No data to display  Initial Impression / Assessment and Plan / UC Course  I have reviewed the triage vital signs and the nursing notes.  Pertinent labs & imaging results that were available during my care of the patient were reviewed by me and considered in my medical decision making (see chart for details).     Lower abdominal pain No acute abdomen on exam.  Believe patient may be mildly constipated.  I do not believe this is a reaction to the Pepcid.  Recommended MiraLAX daily for the next few days. Zofran as needed. Also recommended if symptoms worsen to include more severe abdominal pain she will need to go to the ER. Pt understanding and agreed.  Final Clinical Impressions(s) / UC Diagnoses   Final diagnoses:  Lower abdominal pain     Discharge Instructions     This is not a reaction to the medication you are taking.  You can take the medication as needed do not exceed twice a day.  The Zofran as needed. Believe you may have some constipation.  I would try doing MiraLAX daily for the next few days and see if this helps your  symptoms. If your abdominal pain worsens please follow-up or go to the ER    ED Prescriptions    None     PDMP not reviewed this  encounter.   Janace Aris, NP 09/10/19 1514

## 2019-09-10 NOTE — ED Triage Notes (Signed)
Pt c/o lower abdominal/suprapubic pain onset this morning after having a second bowel movement. Pain now resolved. Pt concerned that her new Rx for famotidine that she started yesterday (total 3 doses taken) "might be reacting with my other medications". Pt reports that pain is absent while seated but occurs with movement, or "while riding in the care here".   Pt reports stool was of normal color, consistency and volume. Had two bowel movements today.   Denies fever, chills, n/v. Reports nausea resolved. Pt was evaluated yesterday for mid-epigastric/central abdominal pain and nausea.

## 2019-09-11 ENCOUNTER — Emergency Department (HOSPITAL_COMMUNITY)
Admission: EM | Admit: 2019-09-11 | Discharge: 2019-09-11 | Disposition: A | Discharge status: Home / Self Care | Payer: Medicaid Other | Attending: Emergency Medicine | Admitting: Emergency Medicine

## 2019-09-11 ENCOUNTER — Other Ambulatory Visit: Payer: Self-pay

## 2019-09-11 DIAGNOSIS — Z5321 Procedure and treatment not carried out due to patient leaving prior to being seen by health care provider: Secondary | ICD-10-CM | POA: Insufficient documentation

## 2019-09-11 DIAGNOSIS — R1031 Right lower quadrant pain: Secondary | ICD-10-CM | POA: Diagnosis not present

## 2019-09-11 DIAGNOSIS — R109 Unspecified abdominal pain: Secondary | ICD-10-CM | POA: Diagnosis not present

## 2019-09-11 DIAGNOSIS — R1030 Lower abdominal pain, unspecified: Secondary | ICD-10-CM | POA: Diagnosis not present

## 2019-09-11 DIAGNOSIS — R350 Frequency of micturition: Secondary | ICD-10-CM | POA: Diagnosis not present

## 2019-09-11 DIAGNOSIS — N390 Urinary tract infection, site not specified: Secondary | ICD-10-CM | POA: Diagnosis not present

## 2019-09-11 DIAGNOSIS — K760 Fatty (change of) liver, not elsewhere classified: Secondary | ICD-10-CM | POA: Diagnosis not present

## 2019-09-11 DIAGNOSIS — K449 Diaphragmatic hernia without obstruction or gangrene: Secondary | ICD-10-CM | POA: Diagnosis not present

## 2019-09-11 LAB — COMPREHENSIVE METABOLIC PANEL
ALT: 40 U/L (ref 0–44)
AST: 37 U/L (ref 15–41)
Albumin: 3.9 g/dL (ref 3.5–5.0)
Alkaline Phosphatase: 72 U/L (ref 38–126)
Anion gap: 12 (ref 5–15)
BUN: 11 mg/dL (ref 6–20)
CO2: 22 mmol/L (ref 22–32)
Calcium: 9.2 mg/dL (ref 8.9–10.3)
Chloride: 103 mmol/L (ref 98–111)
Creatinine, Ser: 0.78 mg/dL (ref 0.44–1.00)
GFR calc Af Amer: >60 mL/min (ref >60)
GFR calc non Af Amer: >60 mL/min (ref >60)
Glucose, Bld: 98 mg/dL (ref 70–99)
Potassium: 4.7 mmol/L (ref 3.5–5.1)
Sodium: 137 mmol/L (ref 135–145)
Total Bilirubin: 1 mg/dL (ref 0.3–1.2)
Total Protein: 6.6 g/dL (ref 6.5–8.1)

## 2019-09-11 LAB — I-STAT BETA HCG BLOOD, ED (MC, WL, AP ONLY): I-stat hCG, quantitative: <5 m[IU]/mL (ref <5)

## 2019-09-11 LAB — CBC
HCT: 43 % (ref 36.0–46.0)
Hemoglobin: 14.1 g/dL (ref 12.0–15.0)
MCH: 28.8 pg (ref 26.0–34.0)
MCHC: 32.8 g/dL (ref 30.0–36.0)
MCV: 87.8 fL (ref 80.0–100.0)
Platelets: 302 10*3/uL (ref 150–400)
RBC: 4.9 MIL/uL (ref 3.87–5.11)
RDW: 12.9 % (ref 11.5–15.5)
WBC: 16.3 10*3/uL — ABNORMAL HIGH (ref 4.0–10.5)
nRBC: 0 % (ref 0.0–0.2)

## 2019-09-11 LAB — LIPASE, BLOOD: Lipase: 26 U/L (ref 11–51)

## 2019-09-11 NOTE — ED Notes (Signed)
Pt notified this NT that listening to others that the wait was too long and that pt cannot wait that long. This NT notified pt if anything changes to not hesitate to come back.

## 2019-09-11 NOTE — ED Triage Notes (Addendum)
Per pt she was here yesterday with lower abdominal pain that now radiates into her back. Pt said she has no nausea, no vomiting. No painful urination, no burning

## 2019-09-12 DIAGNOSIS — Z79899 Other long term (current) drug therapy: Secondary | ICD-10-CM | POA: Diagnosis not present

## 2019-09-12 DIAGNOSIS — R000 Tachycardia, unspecified: Secondary | ICD-10-CM | POA: Diagnosis not present

## 2019-09-12 DIAGNOSIS — K449 Diaphragmatic hernia without obstruction or gangrene: Secondary | ICD-10-CM | POA: Diagnosis not present

## 2019-09-12 DIAGNOSIS — N390 Urinary tract infection, site not specified: Secondary | ICD-10-CM | POA: Diagnosis not present

## 2019-09-12 DIAGNOSIS — R109 Unspecified abdominal pain: Secondary | ICD-10-CM | POA: Diagnosis not present

## 2019-09-12 DIAGNOSIS — R1031 Right lower quadrant pain: Secondary | ICD-10-CM | POA: Diagnosis not present

## 2019-09-12 DIAGNOSIS — K760 Fatty (change of) liver, not elsewhere classified: Secondary | ICD-10-CM | POA: Diagnosis not present

## 2019-09-12 DIAGNOSIS — R350 Frequency of micturition: Secondary | ICD-10-CM | POA: Diagnosis not present

## 2019-09-15 ENCOUNTER — Other Ambulatory Visit: Payer: Self-pay

## 2019-09-15 ENCOUNTER — Encounter: Payer: Self-pay | Admitting: Family Medicine

## 2019-09-15 ENCOUNTER — Ambulatory Visit (INDEPENDENT_AMBULATORY_CARE_PROVIDER_SITE_OTHER): Payer: Medicaid Other | Admitting: Family Medicine

## 2019-09-15 VITALS — BP 104/70 | HR 84 | Wt 308.0 lb

## 2019-09-15 DIAGNOSIS — N12 Tubulo-interstitial nephritis, not specified as acute or chronic: Secondary | ICD-10-CM | POA: Diagnosis not present

## 2019-09-15 DIAGNOSIS — R300 Dysuria: Secondary | ICD-10-CM | POA: Diagnosis not present

## 2019-09-15 HISTORY — DX: Tubulo-interstitial nephritis, not specified as acute or chronic: N12

## 2019-09-15 LAB — POCT UA - MICROSCOPIC ONLY
Epithelial cells, urine per micros: >20
RBC, urine, microscopic: >20

## 2019-09-15 LAB — POCT URINALYSIS DIP (MANUAL ENTRY)
Glucose, UA: NEGATIVE mg/dL
Ketones, POC UA: NEGATIVE mg/dL
Leukocytes, UA: NEGATIVE
Nitrite, UA: NEGATIVE
Protein Ur, POC: 100 mg/dL — AB
Spec Grav, UA: >=1.03 — AB (ref 1.010–1.025)
Urobilinogen, UA: 0.2 E.U./dL
pH, UA: 5.5 (ref 5.0–8.0)

## 2019-09-15 MED ORDER — AMOXICILLIN-POT CLAVULANATE 875-125 MG PO TABS: 1.0000 | ORAL_TABLET | Freq: Two times a day (BID) | ORAL | 0 refills | Status: AC

## 2019-09-15 MED ORDER — CEFTRIAXONE SODIUM 1 G IJ SOLR
1.0000 g | Freq: Once | INTRAMUSCULAR | Status: AC
  Administered 2019-09-15: 1 g via INTRAMUSCULAR

## 2019-09-15 NOTE — Progress Notes (Signed)
    SUBJECTIVE:   CHIEF COMPLAINT / HPI:   Abdominal pain Abdominal pain and back pain for 5 days.  Was seen in the ED on 2 separate occasions.  She had lab work drawn at one of the emergency room visits and had an elevated white count of 16.3.  She also had a urine collected and at the most recent hospital visit with [facility] she was prescribed Keflex for possible UTI after giving a urine sample.  Reports that she was told to follow-up with her PCP and if the pain got worse to follow-up sooner.  She reports good compliance with the Keflex but says that her pain has worsened especially this morning.  Reports that the pain is in her lower abdomen as well as the back on both sides but worse on the right.  Denies any fever, chills, nausea, vomiting, diarrhea, constipation.  Denies any dysuria or blood in her urine although she started her period today.  Most recent pregnancy tests were negative.  Patient is sexually active with one partner and says she uses protection "most of the time".   OBJECTIVE:   BP 104/70   Pulse 84   Wt (!) 308 lb (139.7 kg)   LMP 09/15/2019   SpO2 97%   BMI 54.56 kg/m   General: Anxious appearing, tapping her foot when I am in the room, reports significant abdominal and back pain HEENT: No rhinorrhea noted, no cough present Respiratory: Normal work of breathing, lungs clear to auscultation bilaterally Cardiac: Regular rate and rhythm although initially tachycardic after she calms down it is a regular rate.  No murmurs appreciated Abdomen: Tenderness to light and deep palpation in the umbilical region, costovertebral angle tenderness bilaterally but worse on the right  ASSESSMENT/PLAN:   Pyelonephritis Patient with multiple recent trips to the emergency department for lower abdominal pain.  Patient reports that the pain is now in her back, denies any dysuria.  UA was concerning for UTI at most recent emergency room visit and she was started on Keflex.  Patient  reports that the pain is worsened since that visit.  Denies any fever, chills, nausea, vomiting, diarrhea, constipation, any other sick symptoms.  Urine culture grew greater than 100,000 colonies of mixed gram-positive.  I monitored both to see any susceptibilities.  Given worsening symptoms while on antibiotics I am concerned about pyelonephritis which may be resistant to the Keflex. -UA collected today -Urine culture collected today -1 g ceftriaxone IM given today -Augmentin 875 twice daily for 10 days -Strict return precautions given -If symptoms do not improve patient will need pelvic exam and wet prep at next visit   Derrel Nip, MD [facility]

## 2019-09-15 NOTE — Progress Notes (Signed)
Pt given 1 gram of Ceftriaxone with 2.1 ml of 1% lidocaine in the LUQ. Pt tolerated injection and medication well. Sunday Spillers, CMA

## 2019-09-15 NOTE — Patient Instructions (Addendum)
It was a pleasure to meet you today.  I am sorry you are having so much difficulty with this abdominal pain.  I am concerned that she may have what is called pyelonephritis and that it may be caused by an infection that is resistant to the antibiotic you are on.  We are going to collect a urine sample from you today and reculture it so that we may get which antibiotics it is susceptible to.  I am also going to give you an injection antibiotic which will cover most of the common infections.  I also want to switch your antibiotics to a medication called Augmentin which she will take twice daily for 10 days.  If you have any worsening symptoms such as fever, vomiting, diarrhea, blood in your urine, a sharp decrease in urine production, please seek medical attention immediately.  If you have any questions or concerns please feel free to call our clinic.  I hope you have a wonderful day and get to feeling better!   Pyelonephritis, Adult  Pyelonephritis is an infection that occurs in the kidney. The kidneys are organs that help clean the blood by moving waste out of the blood and into the pee (urine). This infection can happen quickly, or it can last for a long time. In most cases, it clears up with treatment and does not cause other problems. What are the causes? This condition may be caused by:  Germs (bacteria) going from the bladder up to the kidney. This may happen after having a bladder infection.  Germs going from the blood to the kidney. What increases the risk? This condition is more likely to develop in:  Pregnant women.  Older people.  People who have any of these conditions: ? Diabetes. ? Inflammation of the prostate gland (prostatitis), in males. ? Kidney stones or bladder stones. ? Other problems with the kidney or the parts of your body that carry pee from the kidneys to the bladder (ureters). ? Cancer.  People who have a small, thin tube (catheter) placed in the bladder.  People  who are sexually active.  Women who use a medicine that kills sperm (spermicide) to prevent pregnancy.  People who have had a prior urinary tract infection (UTI). What are the signs or symptoms? Symptoms of this condition include:  Peeing often.  A strong urge to pee right away.  Burning or stinging when peeing.  Belly pain.  Back pain.  Pain in the side (flank area).  Fever or chills.  Blood in the pee, or dark pee.  Feeling sick to your stomach (nauseous) or throwing up (vomiting). How is this treated? This condition may be treated by:  Taking antibiotic medicines by mouth (orally).  Drinking enough fluids. If the infection is bad, you may need to stay in the hospital. You may be given antibiotics and fluids that are put directly into a vein through an IV tube. In some cases, other treatments may be needed. Follow these instructions at home: Medicines  Take your antibiotic medicine as told by your doctor. Do not stop taking the antibiotic even if you start to feel better.  Take over-the-counter and prescription medicines only as told by your doctor. General instructions   Drink enough fluid to keep your pee pale yellow.  Avoid caffeine, tea, and carbonated drinks.  Pee (urinate) often. Avoid holding in pee for long periods of time.  Pee before and after sex.  After pooping (having a bowel movement), women should wipe from  front to back. Use each tissue only once.  Keep all follow-up visits as told by your doctor. This is important. Contact a doctor if:  You do not feel better after 2 days.  Your symptoms get worse.  You have a fever. Get help right away if:  You cannot take your medicine or drink fluids as told.  You have chills and shaking.  You throw up.  You have very bad pain in your side or back.  You feel very weak or you pass out (faint). Summary  Pyelonephritis is an infection that occurs in the kidney.  In most cases, this  infection clears up with treatment and does not cause other problems.  Take your antibiotic medicine as told by your doctor. Do not stop taking the antibiotic even if you start to feel better.  Drink enough fluid to keep your pee pale yellow. This information is not intended to replace advice given to you by your health care provider. Make sure you discuss any questions you have with your health care provider. Document Revised: 11/19/2017 Document Reviewed: 11/19/2017 Elsevier Patient Education  2020 [employer].

## 2019-09-15 NOTE — Assessment & Plan Note (Signed)
Patient with multiple recent trips to the emergency department for lower abdominal pain.  Patient reports that the pain is now in her back, denies any dysuria.  UA was concerning for UTI at most recent emergency room visit and she was started on Keflex.  Patient reports that the pain is worsened since that visit.  Denies any fever, chills, nausea, vomiting, diarrhea, constipation, any other sick symptoms.  Urine culture grew greater than 100,000 colonies of mixed gram-positive.  I monitored both to see any susceptibilities.  Given worsening symptoms while on antibiotics I am concerned about pyelonephritis which may be resistant to the Keflex. -UA collected today -Urine culture collected today -1 g ceftriaxone IM given today -Augmentin 875 twice daily for 10 days -Strict return precautions given -If symptoms do not improve patient will need pelvic exam and wet prep at next visit

## 2019-09-17 DIAGNOSIS — F17290 Nicotine dependence, other tobacco product, uncomplicated: Secondary | ICD-10-CM | POA: Diagnosis not present

## 2019-09-17 DIAGNOSIS — T368X5A Adverse effect of other systemic antibiotics, initial encounter: Secondary | ICD-10-CM | POA: Diagnosis not present

## 2019-09-17 DIAGNOSIS — Z79899 Other long term (current) drug therapy: Secondary | ICD-10-CM | POA: Diagnosis not present

## 2019-09-17 DIAGNOSIS — R197 Diarrhea, unspecified: Secondary | ICD-10-CM | POA: Diagnosis not present

## 2019-09-17 DIAGNOSIS — T361X5A Adverse effect of cephalosporins and other beta-lactam antibiotics, initial encounter: Secondary | ICD-10-CM | POA: Diagnosis not present

## 2019-09-17 DIAGNOSIS — R112 Nausea with vomiting, unspecified: Secondary | ICD-10-CM | POA: Diagnosis not present

## 2019-09-17 DIAGNOSIS — F419 Anxiety disorder, unspecified: Secondary | ICD-10-CM | POA: Diagnosis not present

## 2019-09-17 LAB — URINE CULTURE: Organism ID, Bacteria: NO GROWTH

## 2019-09-18 ENCOUNTER — Telehealth: Payer: Self-pay

## 2019-09-18 NOTE — Telephone Encounter (Signed)
I spoke with patient regarding antibiotic usage as well as leg pain usage.  She was told that her urine culture was negative so wants to discontinue taking the antibiotics.  Her culture from our visit was negative but she had been on antibiotics for 4 days prior to giving a urine sample.  Out of concern for pyelonephritis I recommended she continue taking antibiotics at this time until Saturday which would be a 5-day course of antibiotics.  I also discussed use of Zofran and she can take it when feeling nauseous and can take 1 pill every 8 hours but should not take more than not.  Patient was concerned and thought she should not be taking it every day.  I will discuss with the attending and call her back with recommendations regarding antibiotic duration.

## 2019-09-18 NOTE — Telephone Encounter (Signed)
Patient LVM on nurse line yesterday at the end of the day. Patient reports she saw her urine culture results in mychart, no growth, and wondering if she should still be taking her prescribed antibiotics. I called patient back to discuss, however had to LVM. Per chart review, looks like she went to the ED, [facility], and is now currently admitted.

## 2019-09-18 NOTE — Telephone Encounter (Signed)
Patient returns my call. Patient reports she was admitted last night and released. Patient reports she went in for increased NVD, no other symptoms. Patient reports UTI symptoms have subsided. Patient reports they informed her last night she no longer has a UTI and she is "fine" and NVD is normal with antibiotic use. Patient reports all they did was blood work and sent her home. Patient does not want to continue taking Augmentin since culture grew nothing and is giving her undesired side effects. Patient started taking on 8/17, therefore has "quite a bit left." Patient advised in ED to not take Zofran everyday, however she is still experiencing nausea and now afraid to take Zofran. Please advise.

## 2019-09-22 ENCOUNTER — Other Ambulatory Visit: Payer: Self-pay

## 2019-09-22 ENCOUNTER — Ambulatory Visit (INDEPENDENT_AMBULATORY_CARE_PROVIDER_SITE_OTHER): Payer: Medicaid Other | Admitting: Family Medicine

## 2019-09-22 VITALS — BP 112/80 | HR 107 | Ht 63.0 in | Wt 311.2 lb

## 2019-09-22 DIAGNOSIS — R112 Nausea with vomiting, unspecified: Secondary | ICD-10-CM

## 2019-09-22 DIAGNOSIS — H538 Other visual disturbances: Secondary | ICD-10-CM | POA: Diagnosis not present

## 2019-09-22 MED ORDER — FAMOTIDINE 20 MG PO TABS: 20.0000 mg | ORAL_TABLET | Freq: Two times a day (BID) | ORAL | 1 refills | Status: DC

## 2019-09-22 NOTE — Progress Notes (Signed)
    SUBJECTIVE:   CHIEF COMPLAINT / HPI:   Nausea and vomiting: Patient has had almost daily vomiting for the past month as well as nausea for several months.  This morning she woke up at 2 AM vomiting.  She usually vomits in the morning before she has had anything to eat but sometimes after dinner.  She has not had any abdominal pain, no diarrhea, no constipation.  She has had a burning sensation in her throat for which she took Tums.  She has not had this sensation the last few days.  She is not currently taking any PPIs.  Has a bowel movement every day.  She notes that she is actually been gaining weight during this time.  For the past few days she has been having a headache.  She has a family history of migraines.  She has not had vision changes recently, but has had transient blurred vision over the past several years.  She has regular monthly menstrual cycles.  Most recent one started on 17th of this month and ended on the 23rd.  Not currently on any birth control.  Sexually active with one female partner.  Occasionally uses condoms.  Patient recently took Keflex for presumed UTI, but her vomiting predated the Keflex.  May have been worsened by the Keflex.   PERTINENT  PMH / PSH: Anxiety.  OBJECTIVE:   BP 112/80   Pulse (!) 107   Ht 5\' 3"  (1.6 m)   Wt (!) 311 lb 3.2 oz (141.2 kg)   LMP 09/15/2019   SpO2 99%   BMI 55.13 kg/m   General: Alert and oriented obese female. No acute distress. HEENT: Moist oral mucosa, no oropharyngeal erythema. CV: Regular rate and rhythm. Pulmonary: Lungs clear to auscultation bilaterally GI: Obese pannus, nontender to palpation. Normal bowel sounds.  ASSESSMENT/PLAN:   Nausea and vomiting Patient may have GERD or peptic ulcer disease. Although her symptoms appear to have been chronic, they have not interfered with her ability to tolerate p.o. as she has gained weight since her last visit. Without any abdominal pain it is hard to say if this is caused by  gallbladder or pancreas issues. I asked the patient multiple times that she had been taking a PPI which she said no. She then told the staff at the laboratory that she had been recently taking a PPI, therefore we could not get a urea breath test. She was told to not take any PPIs for at least 2 weeks and come back to get retested at that time. We'll also send the patient to the ophthalmologist to look for papilledema as we cannot rule out idiopathic intracranial hypertension. Advised patient to follow-up after results of her tests come back.     09/17/2019, MD [facility] [place]

## 2019-09-22 NOTE — Patient Instructions (Signed)
It was nice to meet you today,  I am going to get some lab work to check for H. pylori as well as check your liver function.  I have also sent in a prescription for twice a day Pepcid.  I would like you to start taking that after you have given the H. pylori test.  I have also sent in a referral to the ophthalmologist so that they can do a dilated eye exam.  I have given you a list of ophthalmologists that you should call to see if they take Medicaid.  I want them to check for something called papilledema which can sometimes occur in patients with intracranial hypertension.  I would like you to schedule an appointment with your PCP, Dr. Clent Ridges, in the next 3 to 4 weeks.  Have a great day,  Katelyn Jericho, MD

## 2019-09-23 ENCOUNTER — Encounter: Payer: Self-pay | Admitting: Family Medicine

## 2019-09-23 LAB — COMPREHENSIVE METABOLIC PANEL
ALT: 46 IU/L — ABNORMAL HIGH (ref 0–32)
AST: 27 IU/L (ref 0–40)
Albumin/Globulin Ratio: 1.9 (ref 1.2–2.2)
Albumin: 4.4 g/dL (ref 3.9–5.0)
Alkaline Phosphatase: 69 IU/L (ref 45–106)
BUN/Creatinine Ratio: 16 (ref 9–23)
BUN: 12 mg/dL (ref 6–20)
Bilirubin Total: 0.3 mg/dL (ref 0.0–1.2)
CO2: 25 mmol/L (ref 20–29)
Calcium: 8.8 mg/dL (ref 8.7–10.2)
Chloride: 104 mmol/L (ref 96–106)
Creatinine, Ser: 0.73 mg/dL (ref 0.57–1.00)
GFR calc Af Amer: 137 mL/min/{1.73_m2} (ref >59)
GFR calc non Af Amer: 119 mL/min/{1.73_m2} (ref >59)
Globulin, Total: 2.3 g/dL (ref 1.5–4.5)
Glucose: 95 mg/dL (ref 65–99)
Potassium: 3.9 mmol/L (ref 3.5–5.2)
Sodium: 141 mmol/L (ref 134–144)
Total Protein: 6.7 g/dL (ref 6.0–8.5)

## 2019-09-23 NOTE — Telephone Encounter (Signed)
Please let patient know that ALT is likely due to nausea and vomiting.  It is an enzyme that is released by liver cells when injured.  Dr Corky Downs had ordered this so maybe he can clarify it for patient if she is wanting more information.  Thanks Kenney Houseman

## 2019-09-25 ENCOUNTER — Encounter: Payer: Self-pay | Admitting: Family Medicine

## 2019-09-25 NOTE — Assessment & Plan Note (Signed)
Patient may have GERD or peptic ulcer disease. Although her symptoms appear to have been chronic, they have not interfered with her ability to tolerate p.o. as she has gained weight since her last visit. Without any abdominal pain it is hard to say if this is caused by gallbladder or pancreas issues. I asked the patient multiple times that she had been taking a PPI which she said no. She then told the staff at the laboratory that she had been recently taking a PPI, therefore we could not get a urea breath test. She was told to not take any PPIs for at least 2 weeks and come back to get retested at that time. We'll also send the patient to the ophthalmologist to look for papilledema as we cannot rule out idiopathic intracranial hypertension. Advised patient to follow-up after results of her tests come back.

## 2019-09-27 NOTE — Progress Notes (Deleted)
    SUBJECTIVE:   CHIEF COMPLAINT / HPI: physical   ***  PERTINENT  PMH / PSH: ***  OBJECTIVE:   LMP 09/15/2019   ***  ASSESSMENT/PLAN:   No problem-specific Assessment & Plan notes found for this encounter.     Dana Allan, MD [facility]

## 2019-09-28 ENCOUNTER — Encounter: Payer: Medicaid Other | Admitting: Family Medicine

## 2019-09-30 DIAGNOSIS — Z419 Encounter for procedure for purposes other than remedying health state, unspecified: Secondary | ICD-10-CM | POA: Diagnosis not present

## 2019-10-06 ENCOUNTER — Other Ambulatory Visit: Payer: Medicaid Other

## 2019-10-06 ENCOUNTER — Other Ambulatory Visit: Payer: Self-pay

## 2019-10-06 DIAGNOSIS — R112 Nausea with vomiting, unspecified: Secondary | ICD-10-CM

## 2019-10-08 LAB — TOXASSURE SELECT 13 (MW), URINE

## 2019-10-08 LAB — H. PYLORI BREATH TEST: H pylori Breath Test: NEGATIVE

## 2019-10-09 ENCOUNTER — Ambulatory Visit (INDEPENDENT_AMBULATORY_CARE_PROVIDER_SITE_OTHER): Payer: Medicaid Other | Admitting: Family Medicine

## 2019-10-09 ENCOUNTER — Other Ambulatory Visit: Payer: Self-pay

## 2019-10-09 ENCOUNTER — Telehealth: Payer: Self-pay | Admitting: *Deleted

## 2019-10-09 ENCOUNTER — Encounter: Payer: Self-pay | Admitting: Family Medicine

## 2019-10-09 VITALS — BP 124/74 | HR 97 | Ht 63.0 in | Wt 310.0 lb

## 2019-10-09 DIAGNOSIS — R112 Nausea with vomiting, unspecified: Secondary | ICD-10-CM

## 2019-10-09 DIAGNOSIS — Z6841 Body Mass Index (BMI) 40.0 and over, adult: Secondary | ICD-10-CM

## 2019-10-09 MED ORDER — PANTOPRAZOLE SODIUM 20 MG PO TBEC: 20.0000 mg | DELAYED_RELEASE_TABLET | Freq: Every day | ORAL | 0 refills | Status: DC

## 2019-10-09 NOTE — Patient Instructions (Signed)
Thank you for coming to see me today. It was a pleasure.   Start Protonix 20 mg daily.  I have sent a referral to [facility], they will call you with an appointment  Please call Dr Gerilyn Pilgrim to talk about nutritional ways to help with weight loss and nausea.  Please follow-up with PCP in 2-3 weeks  If you have any questions or concerns, please do not hesitate to call the office at [phone].  Best,   Dana Allan, MD Family Medicine Residency     Nausea, Adult Nausea is feeling sick to your stomach or feeling that you are about to throw up (vomit). Feeling sick to your stomach is usually not serious, but it may be an early sign of a more serious medical problem. As you feel sicker to your stomach, you may throw up. If you throw up, or if you are not able to drink enough fluids, there is a risk that you may lose too much water in your body (get dehydrated). If you lose too much water in your body, you may:  Feel tired.  Feel thirsty.  Have a dry mouth.  Have cracked lips.  Go pee (urinate) less often. Older adults and people who have other diseases or a weak body defense system (immune system) have a higher risk of losing too much water in the body. The main goals of treating this condition are:  To relieve your nausea.  To ensure your nausea occurs less often.  To prevent throwing up and losing too much fluid. Follow these instructions at home: Watch your symptoms for any changes. Tell your doctor about them. Follow these instructions as told by your doctor. Eating and drinking      Take an ORS (oral rehydration solution). This is a drink that is sold at pharmacies and stores.  Drink clear fluids in small amounts as you are able. These include: ? Water. ? Ice chips. ? Fruit juice that has water added (diluted fruit juice). ? Low-calorie sports drinks.  Eat bland, easy-to-digest foods in small amounts as you are able, such  as: ? Bananas. ? Applesauce. ? Rice. ? Low-fat (lean) meats. ? Toast. ? Crackers.  Avoid drinking fluids that have a lot of sugar or caffeine in them. This includes energy drinks, sports drinks, and soda.  Avoid alcohol.  Avoid spicy or fatty foods. General instructions  Take over-the-counter and prescription medicines only as told by your doctor.  Rest at home while you get better.  Drink enough fluid to keep your pee (urine) pale yellow.  Take slow and deep breaths when you feel sick to your stomach.  Avoid food or things that have strong smells.  Wash your hands often with soap and water. If you cannot use soap and water, use hand sanitizer.  Make sure that all people in your home wash their hands well and often.  Keep all follow-up visits as told by your doctor. This is important. Contact a doctor if:  You feel sicker to your stomach.  You feel sick to your stomach for more than 2 days.  You throw up.  You are not able to drink fluids without throwing up.  You have new symptoms.  You have a fever.  You have a headache.  You have muscle cramps.  You have a rash.  You have pain while peeing.  You feel light-headed or dizzy. Get help right away if:  You have pain in your chest, neck, arm, or jaw.  You feel very weak or you pass out (faint).  You have throw up that is bright red or looks like coffee grounds.  You have bloody or black poop (stools) or poop that looks like tar.  You have a very bad headache, a stiff neck, or both.  You have very bad pain, cramping, or bloating in your belly (abdomen).  You have trouble breathing or you are breathing very quickly.  Your heart is beating very quickly.  Your skin feels cold and clammy.  You feel confused.  You have signs of losing too much water in your body, such as: ? Dark pee, very little pee, or no pee. ? Cracked lips. ? Dry mouth. ? Sunken eyes. ? Sleepiness. ? Weakness. These symptoms  may be an emergency. Do not wait to see if the symptoms will go away. Get medical help right away. Call your local emergency services (911 in the [place] Do not drive yourself to the hospital. Summary  Nausea is feeling sick to your stomach or feeling that you are about to throw up (vomit).  If you throw up, or if you are not able to drink enough fluids, there is a risk that you may lose too much water in your body (get dehydrated).  Eat and drink what your doctor tells you. Take over-the-counter and prescription medicines only as told by your doctor.  Contact a doctor right away if your symptoms get worse or you have new symptoms.  Keep all follow-up visits as told by your doctor. This is important. This information is not intended to replace advice given to you by your health care provider. Make sure you discuss any questions you have with your health care provider. Document Revised: 06/25/2017 Document Reviewed: 06/25/2017 Elsevier Patient Education  2020 [employer].

## 2019-10-09 NOTE — Progress Notes (Addendum)
    SUBJECTIVE:   CHIEF COMPLAINT / HPI: nausea and vomiting  Nausea Patient reports that she has been having problems with nausea in the morning for years.  Associated with intermittent NBNB emesis and midepigastric pain periodically.  She is not having any  Abdominal pain today.  Denies any fevers, decrease in appetite, weight loss, diarrhea or constipation.  She endorses skipping meals and it has been financially difficult to eat three meals a day.  Does not exercise but plans to get a gym membership when more financially stable.  She reports no NSAID use. Does use Zofran prn with some relief.  Has tried Pepcid without relief.  Reports CBD vaping but has not used in 1 month and no THC in a while.  Denies any alcohol intake.    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 Bowel sounds present. Psych: Behavior and speech appropriate to situation.  Appears anxious and continues to shakes legs  ASSESSMENT/PLAN:   Nausea and vomiting Chronic. Continues to have nausea with intermittent NBNB emesis.  Abdominal exam normal. HPylori negative.  Last CMP wnl.  Unclear etiology. Suspect GERD likely cause of nausea and vomiting.  Could also be anxiety contributing to symptoms. -Will try Protonix 20 mg daily, can increase to 40 mg if still symptomatic after 1-2 weeks -CCM for anxiety management -Discussed weight loss and exercise -Refer to Nutritional Therapist, patient has card to call for appointment -If symptoms persist will refer to GI for scope  -Follow up in 2 weeks      09/17/2019, MD [facility]  Health [facility]

## 2019-10-09 NOTE — Chronic Care Management (AMB) (Signed)
  Care Management   Note  10/09/2019 Name: Katelyn Aguilar MRN: [id number] DOB: Apr 20, 2000  Katelyn Aguilar is a 19 y.o. year old female who is a primary care patient of Dana Allan, MD. I reached out to Noe Gens by phone today in response to a referral sent by Katelyn Aguilar PCP,  Dana Allan, MD.  Katelyn Aguilar was given information about care management services today including:  1. Care management services include personalized support from designated clinical staff supervised by her physician, including individualized plan of care and coordination with other care providers 2. 24/7 contact phone numbers for assistance for urgent and routine care needs. 3. The patient may stop care management services at any time by phone call to the office staff.  Patient agreed to services and verbal consent obtained.   Follow up plan: Telephone appointment with care management team member scheduled for: 10/23/2019  [facility] Guide, Embedded Care Coordination [facility] Management

## 2019-10-11 ENCOUNTER — Encounter: Payer: Self-pay | Admitting: Family Medicine

## 2019-10-11 NOTE — Assessment & Plan Note (Addendum)
Chronic. Continues to have nausea with intermittent NBNB emesis.  Abdominal exam normal. HPylori negative.  Last CMP wnl.  Unclear etiology. Suspect GERD likely cause of nausea and vomiting.  Could also be anxiety contributing to symptoms. -Will try Protonix 20 mg daily, can increase to 40 mg if still symptomatic after 1-2 weeks -CCM for anxiety management -Discussed weight loss and exercise -Refer to Nutritional Therapist, patient has card to call for appointment -If symptoms persist will refer to GI for scope  -Follow up in 2 weeks

## 2019-10-23 ENCOUNTER — Telehealth: Payer: Medicaid Other

## 2019-10-23 ENCOUNTER — Ambulatory Visit: Payer: Medicaid Other | Admitting: Licensed Clinical Social Worker

## 2019-10-23 DIAGNOSIS — Z789 Other specified health status: Secondary | ICD-10-CM

## 2019-10-23 DIAGNOSIS — Z139 Encounter for screening, unspecified: Secondary | ICD-10-CM

## 2019-10-23 DIAGNOSIS — F419 Anxiety disorder, unspecified: Secondary | ICD-10-CM

## 2019-10-23 NOTE — Chronic Care Management (AMB) (Signed)
Care Management   Clinical Social Work initial Note  10/23/2019 Name: Katelyn Aguilar MRN: [id number] DOB: 07/09/2000 Katelyn Aguilar is a 19 y.o. year old female who sees Dana Allan, MD for primary care. The Care Management team was consulted by PCP to assist the patient with [employer]  and [employer].Katelyn Kitchen  LCSW reached out to Katelyn Aguilar today by phone to introduce self, assess needs and barriers to care.    Assessment: Patient is pleasant and engaged in conversation.  Experiencing difficulty with food insecurities and managing symptoms of chronic anxiety.  Patient reports dealing with symptoms of anxiety as long as she can remember.  No previous counseling.  Discussed previous coping skills     Recommendation: Patient may benefit from, and is in agreement to connect for ongoing counseling. See care plan below for details Plan:  1. LCSW will provide patient with [facility] foodbox at next office appointment with PCP 2.  LCSW will F/U with patient in 1 week  Review of patient status, including review of consultants reports, relevant laboratory and other test results, and collaboration with appropriate care team members and the patient's provider was performed as part of comprehensive patient evaluation and provision of chronic care management services.    Advance Directive Status:  Not addressed in this encounter SDOH (Social Determinants of Health) assessments performed: Yes:  SDOH Interventions     Most Recent Value  SDOH Interventions  SDOH Interventions for the Following Domains Stress  Food Insecurity Interventions Other (Comment)  [community food referral.]  Stress Interventions Provide Counseling      ; Goals Addressed            This Visit's Progress   . Connect for ongoing counseling       CARE PLAN ENTRY (see longitudinal plan of care for additional care plan information)  Current Barriers:  . Patient with Anxiety acknowledges deficits with connecting to mental  health provider for ongoing counseling.  . Patient is experiencing symptoms of stress and anxiety which seem to be exacerbated by financial stressors; currently unemployed     . Patient needs Support, Education, and Care Coordination in order to meet unmet mental health needs  . No history of previous counseling has managed in the past with CBD but no longer taking. Patient is not interested in medication to manage her symptoms at this time Clinical Social Work Goal(s):  Katelyn Aguilar Over the next 30 days, patient will work with SW manage symptoms of anxiety and stress until connected for ongoing counseling.  . Patient will implement clinical interventions discussed today to decreases symptoms of anxiety and increase knowledge and/or ability of: coping skills. Interventions:  . Assessed patient's  previous treatment, what helps, current needs, how impacting and barriers to care . Provided basic mental health support, education and interventions ( relaxed breathing tech) via EMMI educational video . Discussed several options for long term counseling based on need and insurance. Assisted patient with narrowing the options down to [facility]  ) . Reviewed mental health medications with patient prescribed by PCP and discussed compliance ( patient taking Ability daily to manage ticks) . Other interventions include: Motivational Interviewing; Solution-Focused Strategies;Mindfulness or Relaxation Training;Psychoeducation and/or Health Education Patient Self Care Activities & Deficits:  . Patient is unable to independently navigate community resource options without care coordination support . Patient is able to implement clinical interventions discussed today  . Patient will call [facility] to schedule an appointment  . Patient is motivated for treatment  Initial goal documentation     . Food resources       CARE PLAN ENTRY (see longitudinal plan of care for additional care plan  information)  Current Barriers:  . Patient with food insecurities needs community resources,  . Patient acknowledges deficits and needs support, education and care coordination in order to meet this unmet need  . Patient's family currently receiving food stamps but it is not enough to for her to properly eat 3 times a day . Financial constraints related to not working ; currently living with mother Clinical Goal(s)  . Over the 30 days patient will be able to have food insecurities as demonstrated by having food in her home Interventions provided by LCSW:  . Assessment of needs and barriers to care as well as how impacting    . Provided patient with information about various food options including foodbox at [facility]; patient would like [facility] foodbox . Advised patient to pick up food at [employer] every Friday from 11:00 to 2:30 . Will leave foodbox for patient to pick up during office  visit with PCP Patient Self Care Activities & Deficits:  . Patient is unable to independently navigate community resource options without care coordination support  . Patient is motivated to resolve concern  . Patient is able to contact [employer] as discussed today and go for pick up next week Initial goal documentation      Outpatient Encounter Medications as of 10/23/2019  Medication Sig  . acetaminophen (TYLENOL) 325 MG tablet Take 650 mg by mouth every 6 (six) hours as needed.  . ARIPiprazole (ABILIFY) 5 MG tablet Take 1 tablet (5 mg total) by mouth daily.  . cetirizine (ZYRTEC) 10 MG tablet Take 1 tablet (10 mg total) by mouth daily.  . famotidine (PEPCID) 20 MG tablet Take 1 tablet (20 mg total) by mouth 2 (two) times daily.  . ondansetron (ZOFRAN) 4 MG tablet Take 1 tablet (4 mg total) by mouth every 8 (eight) hours as needed for nausea or vomiting.  . pantoprazole (PROTONIX) 20 MG tablet Take 1 tablet (20 mg total) by mouth daily.  Katelyn Aguilar UNABLE TO FIND 2,000 mg daily. Med Name: CBD vape   No  facility-administered encounter medications on file as of 10/23/2019.      Katelyn Hines, LCSW Care Management & Coordination  [facility] / [facility]   [phone] 10:17 AM

## 2019-10-24 ENCOUNTER — Encounter: Payer: Self-pay | Admitting: Family Medicine

## 2019-10-27 ENCOUNTER — Telehealth: Payer: Medicaid Other

## 2019-10-27 NOTE — Patient Instructions (Addendum)
It was nice seeing you  Today! I'm glad your feeling better.  Continue Protonix 40 mg daily  Follow up with me on Nov 11 at 335 pm  If you have any questions or concerns, please feel free to call the clinic.   Be well,  Dana Allan, MD [facility] Medicine Residency

## 2019-10-27 NOTE — Progress Notes (Signed)
    SUBJECTIVE:   CHIEF COMPLAINT / HPI: follow up from last visit   Chronic Nausea Increased Protonix from 20 to 40 mg after 1 week.  Reports nausea now resolved.  Denies any vomiting, weight loss, abdominal pain, urinary symptoms, diarrhea or constipation. Able to eat more food but smaller amounts.  Has appointment with Nutritionist Oct 13.    PERTINENT  PMH / PSH:  Chronic nausea  OBJECTIVE:   BP 110/86   Pulse (!) 109   Ht 5\' 3"  (1.6 m)   Wt (!) 316 lb (143.3 kg)   LMP 10/12/2019   SpO2 97%   BMI 55.98 kg/m    General: Alert and oriented, no apparent distress  Cardiovascular: RRR with no murmurs noted Respiratory: CTA bilaterally  Gastrointestinal: Bowel sounds present. No abdominal pain   ASSESSMENT/PLAN:   Nausea and vomiting Improving after initiation of Protonix.  Working with nutritionist to improve healthy eating.  Discusses weight loss and will first work on developing healthy meal choices. Will incorporate exercise in small increments.  Likely nausea from GERD given improvement of symptoms with medication. -Continue Protonix 40 mg daily -Encourage healthy weight loss and exercise -Continue to follow with nutritionist -Follow up in 4 weeks     10/14/2019, MD [facility] Health [facility] Medicine [facility]

## 2019-10-28 ENCOUNTER — Encounter: Payer: Self-pay | Admitting: Family Medicine

## 2019-10-28 ENCOUNTER — Other Ambulatory Visit: Payer: Self-pay

## 2019-10-28 ENCOUNTER — Ambulatory Visit (INDEPENDENT_AMBULATORY_CARE_PROVIDER_SITE_OTHER): Payer: Medicaid Other | Admitting: Family Medicine

## 2019-10-28 DIAGNOSIS — R112 Nausea with vomiting, unspecified: Secondary | ICD-10-CM

## 2019-10-28 MED ORDER — PANTOPRAZOLE SODIUM 40 MG PO TBEC: 40.0000 mg | DELAYED_RELEASE_TABLET | Freq: Every day | ORAL | 1 refills | Status: DC

## 2019-10-28 NOTE — Assessment & Plan Note (Signed)
Improving after initiation of Protonix.  Working with nutritionist to improve healthy eating.  Discusses weight loss and will first work on developing healthy meal choices. Will incorporate exercise in small increments.  Likely nausea from GERD given improvement of symptoms with medication. -Continue Protonix 40 mg daily -Encourage healthy weight loss and exercise -Continue to follow with nutritionist -Follow up in 4 weeks

## 2019-10-30 ENCOUNTER — Ambulatory Visit: Payer: Medicaid Other | Admitting: Licensed Clinical Social Worker

## 2019-10-30 DIAGNOSIS — F419 Anxiety disorder, unspecified: Secondary | ICD-10-CM

## 2019-10-30 DIAGNOSIS — Z419 Encounter for procedure for purposes other than remedying health state, unspecified: Secondary | ICD-10-CM | POA: Diagnosis not present

## 2019-10-30 DIAGNOSIS — Z139 Encounter for screening, unspecified: Secondary | ICD-10-CM

## 2019-10-30 NOTE — Chronic Care Management (AMB) (Signed)
Care Management   Clinical Social Work Follow Up   10/30/2019 Name: Jerris Keltz MRN: [id number] DOB: 2000/06/28 Referred by: Dana Allan, MD  Reason for referral : Care Coordination (F/U call)  Denae Zulueta is a 19 y.o. year old female who is a primary care patient of Dana Allan, MD.  Reason for follow-up: assess for barriers and progress with connecting for ongoing counseling and food insecurities .    Assessment: Patient continues to experience symptoms of anxiety, she will work on interventions provided by [employer] until she starts counseling at [facility] in Oct. Patient has resources for food and does not desire continued follow-up from [facility] LCSW.  Plan: Patient will call office if needed.  No F/U schedules with LCSW at this time.  Advance Directive Status: not addressed during this encounter.  SDOH (Social Determinants of Health) assessments performed:No new needs identified   Goals Addressed            This Visit's Progress   . COMPLETED: Connect for ongoing counseling       CARE PLAN ENTRY (see longitudinal plan of care for additional care plan information)  Current Barriers & progress:  . Patient with Anxiety acknowledges deficits with connecting to mental health provider for ongoing counseling.  . Patient is experiencing symptoms of stress and anxiety which seem to be exacerbated by financial stressors; currently unemployed     . Patient needs Support, Education, and Care Coordination in order to meet unmet mental health needs  . No history of previous counseling has managed in the past with CBD but no longer taking. Patient is not interested in medication to manage her symptoms at this time . Patient scheduled counseling appointment with [facility] Oct 25th . Patient has not implemented information provides by LCSW but states will work on it this week, Clinical Social Work [employer]):  Marland Kitchen Over the next 30 days, patient will work with SW manage symptoms of anxiety and  stress until connected for ongoing counseling.  . Patient will implement clinical interventions discussed today to decreases symptoms of anxiety and increase knowledge and/or ability of: coping skills. Interventions:  . Assessed patient's needs, progress, and coping, management and ongoing support . Provided basic mental health support, education and interventions ( relaxed breathing tech) via EMMI educational video . Discussed several options for long term counseling based on need and insurance. Assisted patient with narrowing the options down to [facility]  ) Patient Self Care Activities & Deficits:  . Patient is unable to independently navigate community resource options without care coordination support . Patient is able to implement clinical interventions discussed today  . Patient will keep appointment at [facility] in Oct.  . Patient is motivated for treatment Please see past updates related to this goal by clicking on the "Past Updates" button in the selected goal      . COMPLETED: Food resources       CARE PLAN ENTRY (see longitudinal plan of care for additional care plan information)  Current Barriers:  . Patient with food insecurities needs community resources,  . Patient acknowledges deficits and needs support, education and care coordination in order to meet this unmet need  . Patient's family currently receiving food stamps but it is not enough to for her to properly eat 3 times a day . Financial constraints related to not working ; currently living with mother . Patient picked up food boxes from [facility] office and and will go to food pick at locations  provided by LCSW [employer] every Friday from 11:00 to 2:30 Clinical Goal(s)  . Over the 30 days patient will be able to have food insecurities as demonstrated by having food in her home Interventions provided by LCSW:  . Assessment of needs, progress and barriers with food insecurities  . Provided patient with  information about various food options including foodbox at [facility]; patient would like [facility] foodbox Patient Self Care Activities & Deficits:  . Patient is unable to independently navigate community resource options without care coordination support  . Patient is motivated to resolve concern  . Patient is able to contact [employer] as discussed today and go for pick up next week Please see past updates related to this goal by clicking on the "Past Updates" button in the selected goal       Outpatient Encounter Medications as of 10/30/2019  Medication Sig  . acetaminophen (TYLENOL) 325 MG tablet Take 650 mg by mouth every 6 (six) hours as needed.  . ARIPiprazole (ABILIFY) 5 MG tablet Take 1 tablet (5 mg total) by mouth daily.  . cetirizine (ZYRTEC) 10 MG tablet Take 1 tablet (10 mg total) by mouth daily.  . ondansetron (ZOFRAN) 4 MG tablet Take 1 tablet (4 mg total) by mouth every 8 (eight) hours as needed for nausea or vomiting. (Patient not taking: Reported on 10/28/2019)  . pantoprazole (PROTONIX) 40 MG tablet Take 1 tablet (40 mg total) by mouth daily.   No facility-administered encounter medications on file as of 10/30/2019.   Review of patient status, including review of consultants reports, relevant laboratory and other test results, and collaboration with appropriate care team members and the patient's provider was performed as part of comprehensive patient evaluation and provision of care management services.    Sammuel Hines, LCSW Care Management & Coordination  [facility] / [facility]   [phone] 9:15 AM

## 2019-10-30 NOTE — Patient Instructions (Signed)
Licensed Clinical Social Worker Visit Information Ms. Pillard  it was nice speaking with you. Please call me directly if you have questions [phone] Goals we discussed today:  Goals Addressed            This Visit's Progress    COMPLETED: Connect for ongoing counseling       CARE PLAN ENTRY (see longitudinal plan of care for additional care plan information)  Current Barriers & progress:   Patient with Anxiety acknowledges deficits with connecting to mental health provider for ongoing counseling.   Patient is experiencing symptoms of stress and anxiety which seem to be exacerbated by financial stressors; currently unemployed      Patient needs Support, Education, and Care Coordination in order to meet unmet mental health needs   No history of previous counseling has managed in the past with CBD but no longer taking. Patient is not interested in medication to manage her symptoms at this time  Patient scheduled counseling appointment with [facility] Oct 25th  Patient has not implemented information provides by LCSW but states will work on it this week, Clinical Social Work Goal(s):   Over the next 30 days, patient will work with SW manage symptoms of anxiety and stress until connected for ongoing counseling.   Patient will implement clinical interventions discussed today to decreases symptoms of anxiety and increase knowledge and/or ability of: coping skills. Interventions:   Assessed patient's needs, progress, and coping, management and ongoing support  Provided basic mental health support, education and interventions ( relaxed breathing tech) via EMMI educational video  Discussed several options for long term counseling based on need and insurance. Assisted patient with narrowing the options down to [facility]  ) Patient Self Care Activities & Deficits:   Patient is unable to independently navigate community resource options without care coordination  support  Patient is able to implement clinical interventions discussed today   Patient will keep appointment at [facility] in Oct.   Patient is motivated for treatment Please see past updates related to this goal by clicking on the "Past Updates" button in the selected goal       COMPLETED: Food resources       CARE PLAN ENTRY (see longitudinal plan of care for additional care plan information)  Current Barriers:   Patient with food insecurities needs community resources,   Patient acknowledges deficits and needs support, education and care coordination in order to meet this unmet need   Patient's family currently receiving food stamps but it is not enough to for her to properly eat 3 times a day  Financial constraints related to not working ; currently living with mother  Patient picked up food boxes from [facility] office and and will go to food pick at locations provided by [employer] every Friday from 11:00 to 2:30 Clinical Goal(s)   Over the 30 days patient will be able to have food insecurities as demonstrated by having food in her home Interventions provided by LCSW:   Assessment of needs, progress and barriers with food insecurities   Provided patient with information about various food options including foodbox at [facility]; patient would like [facility] foodbox Patient Self Care Activities & Deficits:   Patient is unable to independently navigate community resource options without care coordination support   Patient is motivated to resolve concern   Patient is able to contact [employer] as discussed today and go for pick up next week Please see past updates related to  this goal by clicking on the "Past Updates" button in the selected goal       Ms. Suitt received Care Management services today:  1. Care Management services include personalized support from designated clinical staff supervised by her physician, including individualized plan of care and  coordination with other care providers 2. 24/7 contact [phone] for assistance for urgent and routine care needs. 3. Care Management are voluntary services and be declined at any time by calling the office. Follow up plan:  Client will call office as needed  Soundra Pilon, LCSW

## 2019-11-09 ENCOUNTER — Encounter: Payer: Self-pay | Admitting: Family Medicine

## 2019-11-10 ENCOUNTER — Other Ambulatory Visit: Payer: Self-pay | Admitting: Family Medicine

## 2019-11-10 DIAGNOSIS — H47323 Drusen of optic disc, bilateral: Secondary | ICD-10-CM | POA: Diagnosis not present

## 2019-11-10 DIAGNOSIS — H538 Other visual disturbances: Secondary | ICD-10-CM | POA: Diagnosis not present

## 2019-11-10 MED ORDER — CETIRIZINE HCL 10 MG PO TABS
10.0000 mg | ORAL_TABLET | Freq: Every day | ORAL | 11 refills | Status: DC
Start: 2019-11-10 — End: 2021-06-19

## 2019-11-10 NOTE — Progress Notes (Signed)
Prescription for Zyrtec sent.  Dana Allan, MD Family Medicine Residency

## 2019-11-11 ENCOUNTER — Encounter: Payer: Medicaid Other | Attending: Family Medicine | Admitting: Registered"

## 2019-11-11 ENCOUNTER — Other Ambulatory Visit: Payer: Self-pay

## 2019-11-11 ENCOUNTER — Encounter: Payer: Self-pay | Admitting: Registered"

## 2019-11-11 DIAGNOSIS — Z713 Dietary counseling and surveillance: Secondary | ICD-10-CM | POA: Insufficient documentation

## 2019-11-11 NOTE — Progress Notes (Signed)
Medical Nutrition Therapy:  Appt start time: 9:08 end time:  9:58.  Assessment:  Primary concerns today: Pt states she is here because her doctor wants her to talk about meal plan.   Pt expectations: none stated  States she has dizzy spells sometimes. States then she will eat something sweet and feel better.   States she used to have acid reflux. Has begun taking medications which are helping. Reports certain things are still triggers such as tomato-based items, greasy items, and sodas.   States she is trying to eat 3 meals/day, as recommended by her doctor. States it is hard to encourage herself to do physical activity. States she used to do weight training in high school and loved it. States she plans to get gym membership once she gets a job.   States she has anxiety tics. States she has a new therapist and has appt to see them for the first time at the end of October.   States they have dinner as family daily. States breakfast and lunch family is pretty much on their own. She and mom are home during the day and have meal discussions. States they typically plan on a day-to-day basis. Reports they receive fresh fruit/vegetables, and non-perishable items on Fridays.   States she lives with mom, step dad, younger brother, and fiance. States there are 5 people living off of 4 people having EBT benefits. States there is limited income in the home; step dad and fiance work. States she is used to [employer] work and plans to start looking for work after her sister goes on honeymoon towards the end of October.    Preferred Learning Style:   No preference indicated   Learning Readiness:   Ready  Change in progress   MEDICATIONS: See list   DIETARY INTAKE:  Usual eating pattern includes 2 meals and 0-1 snacks per day.  Everyday foods include pasta, chicken, beef, and bread.  Avoided foods include pizza, tomato soup, candies.    24-hr recall:  B ( AM): typically skips  Snk ( AM):  banana L ( PM): cheeseburger pasta (beef, pasta, cheese) Snk ( PM): doughnut D ( PM): frozen meal chicken alfredo + garlic bread Snk ( PM):  Beverages: water (2*22 oz; 44 oz), soda (1*12 oz; 12 oz); 56 oz   Usual physical activity: none reported  Estimated energy needs: 2000 calories 225 g carbohydrates 150 g protein 56 g fat  Progress Towards Goal(s):  In progress.   Nutritional Diagnosis:  NB-3.2 Limited access to food or water As related to  lack of food planning skills.  As evidenced by behaviors consistent with food insecurity such as skipping meals and buying low-cost food items.    Intervention:  Nutrition education and counseling. Pt was educated on ways to meal plan and eat on a budget. Discussed priority being food security and have adequate food access to last from month to month. Discussed importance of aiming for 3 meals a day and snacks. Accessed My[employer] for [employer] on a budget on her mobile device to use as resource. Discussed acid reflux and common triggers.  Pt was in agreement with goals listed. Goals: - Plan meals for upcoming week around food being received on Fridays.  - Look around in your kitchen to see what you already have before grocery shopping.  - Use website [employer]cy for meal planning on a budget.  - Utilize frozen, canned, and nonperishable items to help save money.  - Aim for 5 food  groups as best a possible with each meal: protein, vegetables, fruit, dairy, and starch/grain.   Teaching Method Utilized:  Visual Auditory Hands on  Handouts given during visit include:  none  Barriers to learning/adherence to lifestyle change: financial challenges  Demonstrated degree of understanding via:  Teach Back   Monitoring/Evaluation:  Dietary intake, exercise, and body weight in 1 month(s).

## 2019-11-11 NOTE — Patient Instructions (Addendum)
-   Plan meals for upcoming week around food being received on Fridays.   - Look around in your kitchen to see what you already have before grocery shopping.   - Use website [employer] for meal planning on a budget.   - Utilize frozen, canned, and nonperishable items to help save money.   - Aim for 5 food groups as best a possible with each meal: protein, vegetables, fruit, dairy, and starch/grain.

## 2019-11-13 ENCOUNTER — Other Ambulatory Visit: Payer: Self-pay

## 2019-11-13 ENCOUNTER — Ambulatory Visit
Admission: EM | Admit: 2019-11-13 | Discharge: 2019-11-13 | Disposition: A | Discharge status: Home / Self Care | Payer: Medicaid Other | Attending: Physician Assistant | Admitting: Physician Assistant

## 2019-11-13 DIAGNOSIS — K611 Rectal abscess: Secondary | ICD-10-CM | POA: Diagnosis not present

## 2019-11-13 MED ORDER — MUPIROCIN 2 % EX OINT: 1.0000 "application " | TOPICAL_OINTMENT | Freq: Two times a day (BID) | CUTANEOUS | 0 refills | Status: DC

## 2019-11-13 MED ORDER — AMOXICILLIN-POT CLAVULANATE 875-125 MG PO TABS: 1.0000 | ORAL_TABLET | Freq: Two times a day (BID) | ORAL | 0 refills | Status: DC

## 2019-11-13 NOTE — Discharge Instructions (Addendum)
Start augmentin as directed. You can remove current dressing in 24 hours. Keep wound clean and dry. You can clean gently with soap and water. Warm soaks 15 mins at a time, 2-3 times a day. Monitor for spreading redness, increased warmth, increased swelling, fever, follow up at the ED for further evaluation.

## 2019-11-13 NOTE — ED Triage Notes (Signed)
Pt c/o abscess to the inside crease of buttocks x1wk. States small drainage.

## 2019-11-13 NOTE — ED Provider Notes (Signed)
[facility]    CSN: [id number] Arrival date & time: 11/13/19  1854      History   Chief Complaint Chief Complaint  Patient presents with  . Abscess    HPI Katelyn Aguilar is a 19 y.o. female.   19 year old female comes in for 1 week history of possible abscess to the crease of buttock. Has noticed some drainage for the past 2-3 days. States was told there was redness, but given location, unsure of spreading. Denies fever. Has been dressing with guaze daily.      History reviewed. No pertinent past medical history.  Patient Active Problem List   Diagnosis Date Noted  . Pyelonephritis 09/15/2019  . Dizziness 04/22/2019  . Cramp in limb 04/10/2019  . Nausea and vomiting 04/06/2019  . Simple tics 11/17/2018  . Anxiety 11/17/2018  . Involuntary movements 10/21/2018  . Encounter for birth control 10/21/2018    Past Surgical History:  Procedure Laterality Date  . NO PAST SURGERIES      OB History   No obstetric history on file.      Home Medications    Prior to Admission medications   Medication Sig Start Date End Date Taking? Authorizing Provider  acetaminophen (TYLENOL) 325 MG tablet Take 650 mg by mouth every 6 (six) hours as needed.    [provider]  amoxicillin-clavulanate (AUGMENTIN) 875-125 MG tablet Take 1 tablet by mouth every 12 (twelve) hours. 11/13/19   Cathie Hoops, Natane Heward V, PA-C  ARIPiprazole (ABILIFY) 5 MG tablet Take 1 tablet (5 mg total) by mouth daily. 03/03/19   Levert Feinstein, MD  cetirizine (ZYRTEC) 10 MG tablet Take 1 tablet (10 mg total) by mouth daily. 11/10/19   Dana Allan, MD  mupirocin ointment (BACTROBAN) 2 % Apply 1 application topically 2 (two) times daily. 11/13/19   Cathie Hoops, Chealsea Paske V, PA-C  pantoprazole (PROTONIX) 40 MG tablet Take 1 tablet (40 mg total) by mouth daily. 10/28/19   Dana Allan, MD    Family History Family History  Problem Relation Age of Onset  . COPD Mother   . Depression Mother   . Diabetes Other      Social History Social History   Tobacco Use  . Smoking status: Passive Smoke Exposure - Never Smoker  . Smokeless tobacco: Never Used  Substance Use Topics  . Alcohol use: No  . Drug use: No     Allergies   Patient has no known allergies.   Review of Systems Review of Systems  Reason unable to perform ROS: See HPI as above.     Physical Exam Triage Vital Signs ED Triage Vitals [11/13/19 1916]  Enc Vitals Group     BP 128/84     Pulse Rate (!) 112     Resp 18     Temp 98.9 F (37.2 C)     Temp Source Oral     SpO2 98 %     Weight      Height      Head Circumference      Peak Flow      Pain Score 4     Pain Loc      Pain Edu?      Excl. in GC?    No data found.  Updated Vital Signs BP 128/84 (BP Location: Left Arm)   Pulse (!) 112   Temp 98.9 F (37.2 C) (Oral)   Resp 18   LMP 11/09/2019   SpO2 98%    Physical  Exam Exam conducted with a chaperone present.  Constitutional:      General: She is not in acute distress.    Appearance: Normal appearance. She is well-developed. She is not toxic-appearing or diaphoretic.  HENT:     Head: Normocephalic and atraumatic.  Eyes:     Conjunctiva/sclera: Conjunctivae normal.     Pupils: Pupils are equal, round, and reactive to light.  Pulmonary:     Effort: Pulmonary effort is normal. No respiratory distress.  Genitourinary:    Comments: Self draining abscess to the crease of buttock, located approx 4cm inferior of pilonidal area. No surrounding cellulitis. Does not extend to the rectum Musculoskeletal:     Cervical back: Normal range of motion and neck supple.  Skin:    General: Skin is warm and dry.  Neurological:     Mental Status: She is alert and oriented to person, place, and time.      UC Treatments / Results  Labs (all labs ordered are listed, but only abnormal results are displayed) Labs Reviewed - No data to display  EKG   Radiology No results found.  Procedures Incision and  Drainage  Date/Time: 11/13/2019 10:30 PM Performed by: Belinda Fisher, PA-C Authorized by: Belinda Fisher, PA-C   Consent:    Consent obtained:  Verbal   Consent given by:  Patient   Risks discussed:  Bleeding, incomplete drainage, infection and pain   Alternatives discussed:  Alternative treatment Location:    Type:  Abscess   Location:  Anogenital Pre-procedure details:    Skin preparation:  Chloraprep Anesthesia (see MAR for exact dosages):    Anesthesia method:  Local infiltration   Local anesthetic:  Lidocaine 1% WITH epi Procedure type:    Complexity:  Simple Procedure details:    Needle aspiration: no     Incision types:  Single straight   Scalpel blade:  11   Wound management:  Probed and deloculated   Drainage:  Bloody and purulent   Drainage amount:  Moderate   Wound treatment:  Wound left open   Packing materials:  None Post-procedure details:    Patient tolerance of procedure:  Tolerated well, no immediate complications   (including critical care time)  Medications Ordered in UC Medications - No data to display  Initial Impression / Assessment and Plan / UC Course  I have reviewed the triage vital signs and the nursing notes.  Pertinent labs & imaging results that were available during my care of the patient were reviewed by me and considered in my medical decision making (see chart for details).    Given abscess self draining, discussed abx vs I&D with abx. Risks and benefits discussed. Patient would like to proceed with I&D.  Patient tolerated procedure well. Given location, will start augmentin as well. Wound care instructions given. Return precautions given. Patient expresses understanding and agrees to plan.   Final Clinical Impressions(s) / UC Diagnoses   Final diagnoses:  Perirectal abscess    ED Prescriptions    Medication Sig Dispense Auth. Provider   amoxicillin-clavulanate (AUGMENTIN) 875-125 MG tablet Take 1 tablet by mouth every 12 (twelve)  hours. 14 tablet Lorenz Donley V, PA-C   mupirocin ointment (BACTROBAN) 2 % Apply 1 application topically 2 (two) times daily. 22 g Belinda Fisher, PA-C     PDMP not reviewed this encounter.   Belinda Fisher, PA-C 11/13/19 2233

## 2019-11-30 DIAGNOSIS — Z419 Encounter for procedure for purposes other than remedying health state, unspecified: Secondary | ICD-10-CM | POA: Diagnosis not present

## 2019-12-03 ENCOUNTER — Encounter: Payer: Self-pay | Admitting: Family Medicine

## 2019-12-03 ENCOUNTER — Other Ambulatory Visit: Payer: Self-pay

## 2019-12-03 ENCOUNTER — Ambulatory Visit (INDEPENDENT_AMBULATORY_CARE_PROVIDER_SITE_OTHER): Payer: Medicaid Other | Admitting: Family Medicine

## 2019-12-03 VITALS — BP 118/76 | HR 102 | Ht 63.0 in | Wt 319.4 lb

## 2019-12-03 DIAGNOSIS — R112 Nausea with vomiting, unspecified: Secondary | ICD-10-CM

## 2019-12-03 DIAGNOSIS — L0501 Pilonidal cyst with abscess: Secondary | ICD-10-CM | POA: Diagnosis not present

## 2019-12-03 DIAGNOSIS — N926 Irregular menstruation, unspecified: Secondary | ICD-10-CM | POA: Diagnosis not present

## 2019-12-03 DIAGNOSIS — Z3200 Encounter for pregnancy test, result unknown: Secondary | ICD-10-CM | POA: Diagnosis not present

## 2019-12-03 LAB — POCT URINE PREGNANCY: Preg Test, Ur: NEGATIVE

## 2019-12-03 MED ORDER — NORGESTIMATE-ETH ESTRADIOL 0.25-35 MG-MCG PO TABS: 1.0000 | ORAL_TABLET | Freq: Every day | ORAL | 11 refills | Status: DC

## 2019-12-03 NOTE — Progress Notes (Signed)
    SUBJECTIVE:   CHIEF COMPLAINT / HPI: follow up nausea/vomiting, check recent I&D area and requesting preg test  Nausea Longstanding history of nausea with intermittent vomiting.  No abdominal pain. Protonix has helped slightly but not resolved.  Able to eat 3 meals day.  Has had some wight gain since last visit.    Pilonidal Abscess Recent I&D in the ED of abscess 10/15 and treated with antibiotics.  Course completed.  Denies any fevers and reports unsure if any drainage given location of open area.  Irregular menses Had two periods last month. Has always been regular monthly.  Sexually active, no condoms or birth control.  Denies any abdominal pain.  PERTINENT  PMH / PSH:  GERD Anxiety  OBJECTIVE:   BP 118/76   Pulse (!) 102   Ht 5\' 3"  (1.6 m)   Wt (!) 319 lb 6.4 oz (144.9 kg)   LMP 11/24/2019 (Exact Date)   SpO2 99%   BMI 56.58 kg/m    General: Alert and oriented, no apparent distress  Gastrointestinal: Bowel sounds present. No abdominal pain  Derm: open I&D area between upper buttocks, no drainage noted and healing well. Psych: Anxious behavior and speech appropriate to situation  ASSESSMENT/PLAN:   Nausea and vomiting Work up completed so far negative.  No imaging given lack of abdominal pain. -Continue Protonix 40mg  daily -Refer GI, Dr 11/26/2019 at [place] per patients request -Encourage weight loss and healthy eating -Recommend patient to seek therapy for anxiety management as could be contributing to nausea  Pilonidal abscess Resolving. I&D 10/15 in ED.  No drainage, redness. Area remains open and healing. -Continue to keep area clean and dry -Strict return precautions provided -Follow up as needed  Irregular menses Had one month of irregular menses.  -Urine preg test negative -Discussed risks and benefits of BCP, different options with patient and she would like to start BCP today -PAP due at age 60 -Follow up as needed     11/15, MD [facility]

## 2019-12-03 NOTE — Patient Instructions (Addendum)
Thank you for coming to see me today. It was a pleasure.  Referral sent for GI.  They will call you with an appointment.  Your pregnancy test in negative. Start your birth control pill today.  Continue to keep the area that was drained clean and dry.    Please follow-up with PCP as needed  If you have any questions or concerns, please do not hesitate to call the office at [phone].  Best,   Dana Allan, MD Family Medicine Residency    Hormonal Contraception Information Hormonal contraception is a type of birth control that uses hormones to prevent pregnancy. It usually involves a combination of the hormones estrogen and progesterone or only the hormone progesterone. Hormonal contraception works in these ways:  It thickens the mucus in the cervix, making it harder for sperm to enter the uterus.  It changes the lining of the uterus, making it harder for an egg to implant.  It may stop the ovaries from releasing eggs (ovulation). Some women who take hormonal contraceptives that contain only progesterone may continue to ovulate. Hormonal contraception cannot prevent sexually transmitted infections (STIs). Pregnancy may still occur. Estrogen and progesterone contraceptives Contraceptives that use a combination of estrogen and progesterone are available in these forms:  Pill. Pills come in different combinations of hormones. They must be taken at the same time each day. Pills can affect your period, causing you to get your period once every three months or not at all.  Patch. The patch must be worn on the lower abdomen for three weeks and then removed on the fourth.  Vaginal ring. The ring is placed in the vagina and left there for three weeks. It is then removed for one week. Progesterone contraceptives Contraceptives that use progesterone only are available in these forms:  Pill. Pills should be taken every day of the cycle.  Intrauterine device (IUD). This device is  inserted into the uterus and removed or replaced every five years or sooner.  Implant. Plastic rods are placed under the skin of the upper arm. They are removed or replaced every three years or sooner.  Injection. The injection is given once every 90 days. What are the side effects? The side effects of estrogen and progesterone contraceptives include:  Nausea.  Headaches.  Breast tenderness.  Bleeding or spotting between menstrual cycles.  High blood pressure (rare).  Strokes, heart attacks, or blood clots (rare) Side effects of progesterone-only contraceptives include:  Nausea.  Headaches.  Breast tenderness.  Unpredictable menstrual bleeding.  High blood pressure (rare). Talk to your health care provider about what side effects may affect you. Where to find more information  Ask your health care provider for more information and resources about hormonal contraception.  U[place] Department of Health and Cytogeneticist on Women's Health: http://hoffman.com/ Questions to ask:  What type of hormonal contraception is right for me?  How long should I plan to use hormonal contraception?  What are the side effects of the hormonal contraception method I choose?  How can I prevent STIs while using hormonal contraception? Contact a health care provider if:  You start taking hormonal contraceptives and you develop persistent or severe side effects. Summary  Estrogen and progesterone are hormones used in many forms of birth control.  Talk to your health care provider about what side effects may affect you.  Hormonal contraception cannot prevent sexually transmitted infections (STIs).  Ask your health care provider for more information and resources about hormonal contraception. This  information is not intended to replace advice given to you by your health care provider. Make sure you discuss any questions you have with your health care provider. Document Revised:  05/12/2018 Document Reviewed: 12/16/2015 Elsevier Patient Education  2020 [employer].

## 2019-12-04 ENCOUNTER — Encounter: Payer: Self-pay | Admitting: Family Medicine

## 2019-12-04 DIAGNOSIS — N926 Irregular menstruation, unspecified: Secondary | ICD-10-CM | POA: Insufficient documentation

## 2019-12-04 DIAGNOSIS — L0501 Pilonidal cyst with abscess: Secondary | ICD-10-CM

## 2019-12-04 HISTORY — DX: Pilonidal cyst with abscess: L05.01

## 2019-12-04 NOTE — Assessment & Plan Note (Signed)
Resolving. I&D 10/15 in ED.  No drainage, redness. Area remains open and healing. -Continue to keep area clean and dry -Strict return precautions provided -Follow up as needed

## 2019-12-04 NOTE — Assessment & Plan Note (Signed)
Had one month of irregular menses.  -Urine preg test negative -Discussed risks and benefits of BCP, different options with patient and she would like to start BCP today -PAP due at age 19 -Follow up as needed

## 2019-12-04 NOTE — Assessment & Plan Note (Signed)
Work up completed so far negative.  No imaging given lack of abdominal pain. -Continue Protonix 40mg  daily -Refer GI, Dr at [place] per patients request -Encourage weight loss and healthy eating -Recommend patient to seek therapy for anxiety management as could be contributing to nausea

## 2019-12-09 ENCOUNTER — Ambulatory Visit: Payer: Medicaid Other | Admitting: Registered"

## 2019-12-14 ENCOUNTER — Encounter: Payer: Self-pay | Admitting: Family Medicine

## 2019-12-14 NOTE — Telephone Encounter (Signed)
She will need to make an appointment to be seen in clinic or go to urgent care.  I have no available appointments this week.  Kenney Houseman

## 2019-12-16 ENCOUNTER — Ambulatory Visit (INDEPENDENT_AMBULATORY_CARE_PROVIDER_SITE_OTHER): Payer: Medicaid Other | Admitting: Family Medicine

## 2019-12-16 ENCOUNTER — Other Ambulatory Visit: Payer: Self-pay

## 2019-12-16 VITALS — BP 132/80 | HR 100 | Ht 63.0 in | Wt 323.5 lb

## 2019-12-16 DIAGNOSIS — R252 Cramp and spasm: Secondary | ICD-10-CM

## 2019-12-16 DIAGNOSIS — Z23 Encounter for immunization: Secondary | ICD-10-CM | POA: Diagnosis not present

## 2019-12-16 NOTE — Progress Notes (Signed)
    SUBJECTIVE:   CHIEF COMPLAINT / HPI:   Body cramps Katelyn Aguilar presents to clinic today with concerns for worsened cramping.  She reports that she has been having cramps in her legs for many months and has previously been seen for this.  This cramping seems to be worsened lately as she is now having occasional cramping in her ribs on both sides.  This rib pain seems to only have started in the past 1-2 weeks.  She usually experiences cramps more than 10 times per day.  She notices this most when she is trying to wipe yourself after going to the bathroom and she experiences rib cramping.  She otherwise has been having issues with nausea and vomiting.  She has previously been seen in our clinic for ongoing trouble with nausea and vomiting and has been referred to GI.  She has GI follow-up scheduled for January.  PERTINENT  PMH / PSH: Anxiety  OBJECTIVE:   BP 132/80   Ht 5\' 3"  (1.6 m)   Wt (!) 323 lb 8 oz (146.7 kg)   LMP 11/24/2019 (Exact Date)   BMI 57.31 kg/m    General: Alert and cooperative and appears to be in no acute distress Respiratory: Breathing comfortably on room air.  No respiratory distress. Abdomen: No tenderness to palpation of the abdomen.  Mild tenderness with palpation of the ribs along the mid axillary line around the level of the eighth rib bilaterally. Extremities: No skin changes.  No tenderness to palpation.  No bruising. Neuro: Cranial nerves grossly intact  ASSESSMENT/PLAN:   Cramp in limb No concerning symptoms.  No significant lower extremity swelling or tenderness suspicious for DVT.  Seems to be a chronic problem.  We will recheck electrolytes today.  They may be abnormal based on her history of frequent vomiting.  I am suspicious she has strained an intercostal muscle leading to additional discomfort in her rib pain.  She agreed that this would explain her symptoms.  As long as lab testing from today is normal, we will simply await GI follow-up for GI  symptoms which will hopefully improve some of her cramping trouble. -Follow-up BMP, mag, CBC -Tylenol as needed     11/26/2019, MD [facility]

## 2019-12-16 NOTE — Assessment & Plan Note (Signed)
No concerning symptoms.  No significant lower extremity swelling or tenderness suspicious for DVT.  Seems to be a chronic problem.  We will recheck electrolytes today.  They may be abnormal based on her history of frequent vomiting.  I am suspicious she has strained an intercostal muscle leading to additional discomfort in her rib pain.  She agreed that this would explain her symptoms.  As long as lab testing from today is normal, we will simply await GI follow-up for GI symptoms which will hopefully improve some of her cramping trouble. -Follow-up BMP, mag, CBC -Tylenol as needed

## 2019-12-16 NOTE — Patient Instructions (Signed)
Muscle cramping: Sometimes we can figure out the exact cause of muscle cramping but we can rule out any dangerous issues.  Today, we will look at your kidney function and electrolytes to make sure there is not contributing we will also make sure you are not anemic.  As long as this things are normal, I think that there is nothing concerning causing this cramping.  I think the recent worsening could be related to a muscle strain of your ribs from your frequent retching.  I am sure to hear that you have been retching and vomiting so frequently.  I do hope that GI will be helpful for improving her symptoms.

## 2019-12-17 LAB — BASIC METABOLIC PANEL
BUN/Creatinine Ratio: 20 (ref 9–23)
BUN: 15 mg/dL (ref 6–20)
CO2: 20 mmol/L (ref 20–29)
Calcium: 9.3 mg/dL (ref 8.7–10.2)
Chloride: 102 mmol/L (ref 96–106)
Creatinine, Ser: 0.75 mg/dL (ref 0.57–1.00)
GFR calc Af Amer: 133 mL/min/{1.73_m2} (ref >59)
GFR calc non Af Amer: 115 mL/min/{1.73_m2} (ref >59)
Glucose: 91 mg/dL (ref 65–99)
Potassium: 4.6 mmol/L (ref 3.5–5.2)
Sodium: 139 mmol/L (ref 134–144)

## 2019-12-17 LAB — CBC
Hematocrit: 40.7 % (ref 34.0–46.6)
Hemoglobin: 13.7 g/dL (ref 11.1–15.9)
MCH: 28.4 pg (ref 26.6–33.0)
MCHC: 33.7 g/dL (ref 31.5–35.7)
MCV: 84 fL (ref 79–97)
Platelets: 292 10*3/uL (ref 150–450)
RBC: 4.82 x10E6/uL (ref 3.77–5.28)
RDW: 12.7 % (ref 11.7–15.4)
WBC: 11.7 10*3/uL — ABNORMAL HIGH (ref 3.4–10.8)

## 2019-12-17 LAB — MAGNESIUM: Magnesium: 2.1 mg/dL (ref 1.6–2.3)

## 2019-12-21 DIAGNOSIS — F411 Generalized anxiety disorder: Secondary | ICD-10-CM | POA: Diagnosis not present

## 2019-12-30 ENCOUNTER — Encounter: Payer: Self-pay | Admitting: Family Medicine

## 2019-12-30 DIAGNOSIS — Z419 Encounter for procedure for purposes other than remedying health state, unspecified: Secondary | ICD-10-CM | POA: Diagnosis not present

## 2019-12-31 ENCOUNTER — Ambulatory Visit (INDEPENDENT_AMBULATORY_CARE_PROVIDER_SITE_OTHER): Payer: Medicaid Other | Admitting: Family Medicine

## 2019-12-31 ENCOUNTER — Other Ambulatory Visit: Payer: Self-pay

## 2019-12-31 ENCOUNTER — Encounter: Payer: Self-pay | Admitting: Family Medicine

## 2019-12-31 VITALS — BP 118/82 | HR 114 | Wt 327.2 lb

## 2019-12-31 DIAGNOSIS — L0591 Pilonidal cyst without abscess: Secondary | ICD-10-CM

## 2019-12-31 DIAGNOSIS — L0501 Pilonidal cyst with abscess: Secondary | ICD-10-CM

## 2019-12-31 MED ORDER — MULTIVITAMINS PO CAPS: 1.0000 | ORAL_CAPSULE | Freq: Every day | ORAL | 3 refills | Status: DC

## 2019-12-31 NOTE — Progress Notes (Signed)
    SUBJECTIVE:   CHIEF COMPLAINT / HPI: abscess continues to drain  Had pilonidal abscess drained 10/15.  Completed course of antibiotics. Area has not completely closed.  Continues to drain small amounts of yellowish fluid.  Area itchy.  Denies any fever.  Only painful when picked at.    PERTINENT  PMH / PSH:  Obesity Recurrent Pilonidal cysts with I&D  OBJECTIVE:   BP 118/82   Pulse (!) 114   Wt (!) 327 lb 3.2 oz (148.4 kg)   LMP 12/29/2019 (Exact Date)   SpO2 97%   BMI 57.96 kg/m    General: Alert and oriented, no apparent distress Coccyx: healing surgical wound.  No discharge, induration, fluctuation or erythema noted.   ASSESSMENT/PLAN:   Pilonidal abscess Healing surgical incision.  No signs of recurrent infection.  Discussed with patient that this will take time to heal and may require surgical removal.  Patient opted to have removed if possible. -Refer to general surgery for evaluation -Continue to keep area clean and dry as possible -No need for antibiotics at this time -Strict return precautions provided  -Follow up as needed     Dana Allan, MD [facility]

## 2019-12-31 NOTE — Patient Instructions (Signed)
Thank you for coming to see me today. It was a pleasure.   Referral to general surgery today.  If he did not hear anything back in a couple weeks please call clinic let us know so we can check in to this.  Please note that holiday months he may not hear anything until after the holidays.  Please keep the area dry.  You do not need to use dressings.  If you notice any drainage, dry area and do not put dressings this will cause moisture to accumulate and potential infection can arise.  Please follow-up with PCP as needed  If you have any questions or concerns, please do not hesitate to call the office at [phone].  Best,   Dana Allan, MD [facility]

## 2020-01-03 ENCOUNTER — Encounter: Payer: Self-pay | Admitting: Family Medicine

## 2020-01-03 NOTE — Assessment & Plan Note (Signed)
Healing surgical incision.  No signs of recurrent infection.  Discussed with patient that this will take time to heal and may require surgical removal.  Patient opted to have removed if possible. -Refer to general surgery for evaluation -Continue to keep area clean and dry as possible -No need for antibiotics at this time -Strict return precautions provided  -Follow up as needed

## 2020-01-04 DIAGNOSIS — F411 Generalized anxiety disorder: Secondary | ICD-10-CM | POA: Diagnosis not present

## 2020-01-12 ENCOUNTER — Ambulatory Visit (INDEPENDENT_AMBULATORY_CARE_PROVIDER_SITE_OTHER): Payer: Medicaid Other | Admitting: Student in an Organized Health Care Education/Training Program

## 2020-01-12 ENCOUNTER — Other Ambulatory Visit (HOSPITAL_COMMUNITY)
Admission: RE | Admit: 2020-01-12 | Discharge: 2020-01-12 | Disposition: A | Discharge status: Home / Self Care | Payer: Medicaid Other | Source: Ambulatory Visit | Attending: Family Medicine | Admitting: Family Medicine

## 2020-01-12 ENCOUNTER — Other Ambulatory Visit: Payer: Self-pay

## 2020-01-12 VITALS — BP 130/80 | HR 127 | Ht 63.0 in | Wt 327.8 lb

## 2020-01-12 DIAGNOSIS — Z113 Encounter for screening for infections with a predominantly sexual mode of transmission: Secondary | ICD-10-CM

## 2020-01-12 LAB — POCT WET PREP (WET MOUNT)
Clue Cells Wet Prep Whiff POC: NEGATIVE
Trichomonas Wet Prep HPF POC: ABSENT

## 2020-01-12 LAB — POCT UA - MICROSCOPIC ONLY: Epithelial cells, urine per micros: >20

## 2020-01-12 LAB — POCT URINALYSIS DIP (MANUAL ENTRY)
Bilirubin, UA: NEGATIVE
Blood, UA: NEGATIVE
Glucose, UA: NEGATIVE mg/dL
Ketones, POC UA: NEGATIVE mg/dL
Nitrite, UA: NEGATIVE
Spec Grav, UA: 1.025 (ref 1.010–1.025)
Urobilinogen, UA: 0.2 E.U./dL
pH, UA: 5.5 (ref 5.0–8.0)

## 2020-01-12 LAB — POCT URINE PREGNANCY: Preg Test, Ur: NEGATIVE

## 2020-01-12 NOTE — Assessment & Plan Note (Signed)
Screening for infections due to new onset vaginal odor in sexually active patient in recent polyamorous relationship. She has h/o UTI with abnormal presentation.  Urinalysis with upreg Gc/ct, wet prep, RPR, HIV

## 2020-01-12 NOTE — Patient Instructions (Signed)
It was a pleasure to see you today!  To summarize our discussion for this visit:  We have screened for STDs today as well as looking for urine infection and pregnancy. I will message you with the results.     Some additional health maintenance measures we should update are: Health Maintenance Due  Topic Date Due  . Hepatitis C Screening  Never done  . COVID-19 Vaccine (1) Never done  .    Call the clinic at [phone] if your symptoms worsen or you have any concerns.   Thank you for allowing me to take part in your care,  Dr. Jamelle Rushing   Safe Sex Practicing safe sex means taking steps before and during sex to reduce your risk of:  Getting an STI (sexually transmitted infection).  Giving your partner an STI.  Unwanted or unplanned pregnancy. How can I practice safe sex?     Ways you can practice safe sex  Limit your sexual partners to only one partner who is having sex with only you.  Avoid using alcohol and drugs before having sex. Alcohol and drugs can affect your judgment.  Before having sex with a new partner: ? Talk to your partner about past partners, past STIs, and drug use. ? Get screened for STIs and discuss the results with your partner. Ask your partner to get screened, too.  Check your body regularly for sores, blisters, rashes, or unusual discharge. If you notice any of these problems, visit your health care provider.  Avoid sexual contact if you have symptoms of an infection or you are being treated for an STI.  While having sex, use a condom. Make sure to: ? Use a condom every time you have vaginal, oral, or anal sex. Both females and males should wear condoms during oral sex. ? Keep condoms in place from the beginning to the end of sexual activity. ? Use a latex condom, if possible. Latex condoms offer the best protection. ? Use only water-based lubricants with a condom. Using petroleum-based lubricants or oils will weaken the condom and  increase the chance that it will break. Ways your health care provider can help you practice safe sex  See your health care provider for regular screenings, exams, and tests for STIs.  Talk with your health care provider about what kind of birth control (contraception) is best for you.  Get vaccinated against hepatitis B and human papillomavirus (HPV).  If you are at risk of being infected with HIV (human immunodeficiency virus), talk with your health care provider about taking a prescription medicine to prevent HIV infection. You are at risk for HIV if you: ? Are a man who has sex with other men. ? Are sexually active with more than one partner. ? Take drugs by injection. ? Have a sex partner who has HIV. ? Have unprotected sex. ? Have sex with someone who has sex with both men and women. ? Have had an STI. Follow these instructions at home:  Take over-the-counter and prescription medicines as told by your health care provider.  Keep all follow-up visits as told by your health care provider. This is important. Where to find more information  Centers for Disease Control and Prevention: LessFurniture.be  Planned Parenthood: https://www.plannedparenthood.org/  Office on Women's Health: EmploymentTracking.tn Summary  Practicing safe sex means taking steps before and during sex to reduce your risk of STIs, giving your partner STIs, and having an unwanted or unplanned pregnancy.  Before having sex with a new  partner, talk to your partner about past partners, past STIs, and drug use.  Use a condom every time you have vaginal, oral, or anal sex. Both females and males should wear condoms during oral sex.  Check your body regularly for sores, blisters, rashes, or unusual discharge. If you notice any of these problems, visit your health care provider.  See your health care provider for regular screenings,  exams, and tests for STIs. This information is not intended to replace advice given to you by your health care provider. Make sure you discuss any questions you have with your health care provider. Document Revised: 05/09/2018 Document Reviewed: 10/28/2017 Elsevier Patient Education  2020 [employer].

## 2020-01-12 NOTE — Progress Notes (Signed)
    SUBJECTIVE:   CHIEF COMPLAINT / HPI: vaginal odor  Saturday night started having vaginal odor.  No new soaps or cleansers. Thinks its from something she ate.  Urine has foul odor. No dysuria.  Has a h/o UTI without dysuria.  Unknown if she has increased discharge or not because she has a large amount at baseline.  She is sexually active. Last time was a little less than a week ago. Uses OCP- takes them very consistently and started them about 1.5 months ago. Last period 11/29 and was a little heavier than usual.  No h/o STIs.  No h/o pregnancies.  Partner does not have any known symptoms and there are multiple partners in relationship.   OBJECTIVE:   BP 130/80   Pulse (!) 127   Ht 5\' 3"  (1.6 m)   Wt (!) 327 lb 12.8 oz (148.7 kg)   LMP 12/29/2019 (Exact Date)   SpO2 97%   BMI 58.07 kg/m   General: NAD, pleasant, able to participate in exam Pelvic: negative for external lesions. Positive for yellowish vaginal discharge with foul odor and erythematous skin irritation paravaginally.  Extremities: no edema. WWP. Skin: warm and dry, no rashes noted Neuro: alert and oriented, no focal deficits Psych: anxious affect and mood  ASSESSMENT/PLAN:   Screen for STD (sexually transmitted disease) Screening for infections due to new onset vaginal odor in sexually active patient in recent polyamorous relationship. She has h/o UTI with abnormal presentation.  Urinalysis with upreg Gc/ct, wet prep, RPR, HIV    12/31/2019, DO [facility]

## 2020-01-13 ENCOUNTER — Other Ambulatory Visit: Payer: Self-pay | Admitting: Student in an Organized Health Care Education/Training Program

## 2020-01-13 LAB — RPR: RPR Ser Ql: NONREACTIVE

## 2020-01-13 LAB — CERVICOVAGINAL ANCILLARY ONLY
Chlamydia: NEGATIVE
Comment: NEGATIVE
Comment: NORMAL
Neisseria Gonorrhea: NEGATIVE

## 2020-01-13 LAB — HIV ANTIBODY (ROUTINE TESTING W REFLEX): HIV Screen 4th Generation wRfx: NONREACTIVE

## 2020-01-13 MED ORDER — CEPHALEXIN 500 MG PO CAPS: 500.0000 mg | ORAL_CAPSULE | Freq: Four times a day (QID) | ORAL | 0 refills | Status: AC

## 2020-01-13 NOTE — Progress Notes (Signed)
Treating for UTI with keflex. Patient was informed of lab results and to use back up birth control method.  Instructed to follow up if not improving with treatment.

## 2020-01-21 DIAGNOSIS — F411 Generalized anxiety disorder: Secondary | ICD-10-CM | POA: Diagnosis not present

## 2020-01-28 ENCOUNTER — Telehealth: Payer: Self-pay

## 2020-01-28 ENCOUNTER — Encounter: Payer: Self-pay | Admitting: Family Medicine

## 2020-01-28 MED ORDER — ALBUTEROL SULFATE HFA 108 (90 BASE) MCG/ACT IN AERS: 2.0000 | INHALATION_SPRAY | Freq: Four times a day (QID) | RESPIRATORY_TRACT | 0 refills | Status: DC | PRN

## 2020-01-28 NOTE — Telephone Encounter (Signed)
I am precepting and was asked to look at this. Per Dahlia Client, patient without any dyspnea on the phone, speaking in full sentences.  Recommendations:  -If she is short of breath she needs to seek care in UC or the ED. -She needs to be tested for COVID. I recommend she looks at [employer] COVID testing online at the [employer] - they seem to have the best availability for outpatient testing. -Needs to isolate at home until test has resulted -If she is unvaccinated, strongly recommend she be vaccinated after she recovers from this illness. Looking in [facility], I do not see evidence of prior vaccination.  Will send in albuterol inhaler for her to have on hand, but again, with any shortness of breath, especially if worsening, recommend she be evaluated in person.  Latrelle Dodrill, MD

## 2020-01-28 NOTE — Telephone Encounter (Signed)
Patient calls nurse line due to significant other testing positive for COVID. Patient has not been tested but reports that she knows that she has it due to her symptoms. Patient is requesting an albuterol inhaler to help with [facility]. Patient reports that she has been mild SHOB all day. Advised patient that ED evaluation would be warranted due to [facility], patient reports that it is not severe enough to go to the ED. However, she will go if she has worsening of SHOB.   Albuterol inhaler is not on current medication list, however, patient states that she has used inhaler in the past due to allergies.   Please advise if inhaler can be sent to [facility] on [address].   Veronda Prude, RN

## 2020-01-28 NOTE — Telephone Encounter (Signed)
Called patient to inform of preceptor recommendations. Gave information for testing sites and at home kits. Patient reports her pharmacy notified her of inhaler, however it is not covered by her insurance. I called the pharmacy and gave verbal to switch to Proair for a 3 dollar copay.  ED precautions given to patient as well as quarantine instructions.

## 2020-01-30 DIAGNOSIS — Z419 Encounter for procedure for purposes other than remedying health state, unspecified: Secondary | ICD-10-CM | POA: Diagnosis not present

## 2020-02-02 ENCOUNTER — Ambulatory Visit: Payer: Medicaid Other | Admitting: Gastroenterology

## 2020-02-05 DIAGNOSIS — F411 Generalized anxiety disorder: Secondary | ICD-10-CM | POA: Diagnosis not present

## 2020-02-19 ENCOUNTER — Other Ambulatory Visit: Payer: Self-pay

## 2020-02-19 ENCOUNTER — Ambulatory Visit (INDEPENDENT_AMBULATORY_CARE_PROVIDER_SITE_OTHER): Payer: Medicaid Other | Admitting: Family Medicine

## 2020-02-19 VITALS — BP 124/80 | HR 117 | Ht 63.0 in | Wt 337.4 lb

## 2020-02-19 DIAGNOSIS — R252 Cramp and spasm: Secondary | ICD-10-CM | POA: Diagnosis not present

## 2020-02-19 DIAGNOSIS — Z23 Encounter for immunization: Secondary | ICD-10-CM

## 2020-02-19 NOTE — Assessment & Plan Note (Signed)
Cramp of right lower extremity.  It has now resolved and no concerning symptoms at this time.  No edema, erythema, tenderness to palpation suspicious of DVT.  Patient has history of these.  Most recently seen in November 2021 and electrolytes were normal.  Patient does have frequent vomiting issues.  She has seen a gastroenterologist for this. - Provided stretching exercises for the patient - Tylenol as needed for pain and discomfort - Follow-up as needed

## 2020-02-19 NOTE — Patient Instructions (Signed)
It was great seeing you today.  I am sorry you are having these issues with the cramps.  I recommend you continue taking your multivitamin as you are taking it when you can.  I also recommend stretching every morning.  Be sure to hold the stretches for at least 20 seconds.  If you have any issues, questions, concerns please feel free to call the clinic.  I hope you have a wonderful afternoon!   Leg Cramps Leg cramps occur when one or more muscles tighten and a person has no control over it (involuntary muscle contraction). Muscle cramps are most common in the calf muscles of the leg. They can occur during exercise or at rest. Leg cramps are painful, and they may last for a few seconds to a few minutes. Cramps may return several times before they finally stop. Usually, leg cramps are not caused by a serious medical problem. In many cases, the cause is not known. Some common causes include:  Excessive physical effort (overexertion), such as during intense exercise.  Doing the same motion over and over.  Staying in a certain position for a long period of time.  Improper preparation, form, or technique while doing a sport or an activity.  Dehydration.  Injury.  Side effects of certain medicines.  Abnormally low levels of minerals in your blood (electrolytes), especially potassium and calcium. This could result from: ? Pregnancy. ? Taking diuretic medicines. Follow these instructions at home: Eating and drinking  Drink enough fluid to keep your urine pale yellow. Staying hydrated may help prevent cramps.  Eat a healthy diet that includes plenty of nutrients to help your muscles function. A healthy diet includes fruits and vegetables, lean protein, whole grains, and low-fat or nonfat dairy products. Managing pain, stiffness, and swelling  Try massaging, stretching, and relaxing the affected muscle. Do this for several minutes at a time.  If directed, put ice on areas that are sore or  painful after a cramp. To do this: ? Put ice in a plastic bag. ? Place a towel between your skin and the bag. ? Leave the ice on for 20 minutes, 2-3 times a day. ? Remove the ice if your skin turns bright red. This is very important. If you cannot feel pain, heat, or cold, you have a greater risk of damage to the area.  If directed, apply heat to muscles that are tense or tight. Do this before you exercise, or as often as told by your health care provider. Use the heat source that your health care provider recommends, such as a moist heat pack or a heating pad. To do this: ? Place a towel between your skin and the heat source. ? Leave the heat on for 20-30 minutes. ? Remove the heat if your skin turns bright red. This is especially important if you are unable to feel pain, heat, or cold. You may have a greater risk of getting burned.  Try taking hot showers or baths to help relax tight muscles.      General instructions  If you are having frequent leg cramps, avoid intense exercise for several days.  Take over-the-counter and prescription medicines only as told by your health care provider.  Keep all follow-up visits. This is important. Contact a health care provider if:  Your leg cramps get more severe or more frequent, or they do not improve over time.  Your foot becomes cold, numb, or blue. Summary  Muscle cramps can develop in any muscle,  but the most common place is in the calf muscles of the leg.  Leg cramps are painful, and they may last for a few seconds to a few minutes.  Usually, leg cramps are not caused by a serious medical problem. Often, the cause is not known.  Stay hydrated, and take over-the-counter and prescription medicines only as told by your health care provider. This information is not intended to replace advice given to you by your health care provider. Make sure you discuss any questions you have with your health care provider. Document Revised: 06/03/2019  Document Reviewed: 06/03/2019 Elsevier Patient Education  2021 [employer].

## 2020-02-19 NOTE — Progress Notes (Signed)
    SUBJECTIVE:   CHIEF COMPLAINT / HPI:   Leg, hip, rib cramps  Patient reports that she was laying in bed this morning and had a terrible right calf cramp which started on the medial aspect of her calf and radiated around to the lateral aspect of her leg.  She reports that she has issues of chronic cramps in her legs, hips, ribs.  She attributes the cramps in her ribs to the fact that she vomits daily and she is seeing a gastroenterologist about this.  She reports she was taking multivitamin but has not been doing that due to cost constraints.   Reports that she occasionally stretches but not often.  COVID-19 vaccine status Discussed the risk and benefits of the COVID-19 vaccine.  Patient was initially hesitant but after careful consideration she has decided to get the COVID-19 vaccine.  Patient did have COVID "a few weeks ago" but did not receive any infusions and she is eligible for the vaccine today.  OBJECTIVE:   BP 124/80   Pulse (!) 117   Ht 5\' 3"  (1.6 m)   Wt (!) 337 lb 6.4 oz (153 kg)   SpO2 97%   BMI 59.77 kg/m   General: Well-appearing 20 year old female, obese, no acute distress Cardiac: Regular rate and rhythm, no murmurs appreciated Respiratory: Normal work of breathing, lungs clear to auscultation bilaterally MSK: No tenderness to palpation of right calf, full range of motion, no difficulty with ambulation  ASSESSMENT/PLAN:   Cramp in limb Cramp of right lower extremity.  It has now resolved and no concerning symptoms at this time.  No edema, erythema, tenderness to palpation suspicious of DVT.  Patient has history of these.  Most recently seen in November 2021 and electrolytes were normal.  Patient does have frequent vomiting issues.  She has seen a gastroenterologist for this. - Provided stretching exercises for the patient - Tylenol as needed for pain and discomfort - Follow-up as needed     December 2021, MD [facility]

## 2020-02-22 ENCOUNTER — Telehealth: Payer: Self-pay | Admitting: Neurology

## 2020-02-22 MED ORDER — ARIPIPRAZOLE 5 MG PO TABS
5.0000 mg | ORAL_TABLET | Freq: Every day | ORAL | 1 refills | Status: DC
Start: 2020-02-22 — End: 2020-05-03

## 2020-02-22 NOTE — Telephone Encounter (Signed)
Per last note, the patient was to ask her PCP to manage further refills of Abilify. I called to her back and she states the appt was made here due to an increase in tics. Jillian made the follow up for 04/07/20. She has refills to last until this appt.

## 2020-02-22 NOTE — Addendum Note (Signed)
Addended by: Lindell Spar C on: 02/22/2020 10:30 AM   Modules accepted: Orders

## 2020-02-22 NOTE — Telephone Encounter (Signed)
Pt requested appointment for annual med refill. Gave pt the next available and she states she will need meds refilled before then. Please advise.

## 2020-02-24 NOTE — Progress Notes (Signed)
    SUBJECTIVE:   CHIEF COMPLAINT / HPI: switch birth control method  Irregular Menses Reports that OCP causing increase in abdominal cramping. Would like different method of birth control.  Wants to try Depo injection. LMP 01/25  Anxiety Continues to have anxiety.  Does not wish to start medications.  Has not sought out therapy yet despite having been given resources.Reports that COVID has made it difficult for her.  Tachycardia Asymptomatic.  Denies any chest pain, paipitations, hypoxic or sob. Endorses that HR always elevated.  Fatigue Reports daytime fatigue. Ongoing for over 1year. Snores at nit as well.Requesting sleep studies.    PERTINENT  PMH / PSH:  Class 3 Obesity HTN OHS Anxiety Tourette's Like Syndrome  OBJECTIVE:   BP 112/74   Pulse (!) 108   Ht 5\' 3"  (1.6 m)   Wt (!) 336 lb 6.4 oz (152.6 kg)   LMP 02/23/2020 (Exact Date)   SpO2 96%   BMI 59.59 kg/m    General: Alert, no acute distress Cardio: Normal S1 and S2, RRR, no r/m/g Pulm: CTAB, normal work of breathing Abdomen: Bowel sounds normal. Abdomen soft and non-tender.  Extremities: No peripheral edema.  Neuro: Cranial nerves grossly intact   ASSESSMENT/PLAN:   Irregular menses Started OCP for irregular menses but not tolerating mediations well,causing abdominal cramping -UPT negative -Stop current method of OCP -Start Depo injection q3/12, first dose today -Follow up RN clinic for next injection 3/12 -Follow up as needed   Anxiety Continues to have anxiety. Doesn't want to start medication. -Encouraged to seek medical therapy -Can start SSRI at patients request -Mental health resources provided -Follow up as needed  Tachycardia Chronic Tachycardia. Asymptomatic. Last TSH 1.880.Likelysecondary to anxiety. -Continue to monitor -Follow upasneeded   Fatigue Excessive daytime fatigue.  Snores at night.  Denies an  SOB or chest pain. BMI 59.9kg.Likey a contrinibuting factor. -Sleep  studied forevaluation of OSA -Sleep hygiene instructions provided -Follow up as needed     5/12, MD [facility]

## 2020-02-24 NOTE — Patient Instructions (Addendum)
Thank you for coming to see me today. It was a pleasure.  Stop taking your birth control pills today   Urine pregnancy test is negative  Depo injection is given every 3 months.  You may experience some breakthrough bleeding in the first 3-6 months.  I have referred you for sleep studies.  Please follow-up with PCP as needed  If you have any questions or concerns, please do not hesitate to call the office at [phone].  Best,   Dana Allan, MD   What can I do to improve my insomnia? -- You can follow good "sleep hygiene." That means that you: ?Sleep only long enough to feel rested and then get out of bed ?Go to bed and get up at the same time every day ?Do not try to force yourself to sleep. If you can't sleep, get out of bed and try again later. ?Have coffee, tea, and other foods that have caffeine only in the morning ?Avoid alcohol in the late afternoon, evening, and bedtime ?Avoid smoking, especially in the evening ?Keep your bedroom dark, cool, quiet, and free of reminders of work or other things that cause you stress ?Solve problems you have before you go to bed ?Exercise several days a week, but not right before bed ?Avoid looking at phones or reading devices ("e-books") that give off light before bed. This can make it harder to fall asleep. Other things that can improve sleep include: ?Relaxation therapy, in which you focus on relaxing all the muscles in your body 1 by 1 ?Working with a Conservator, museum/gallery to deal with the problems that might be causing poor sleep  Sleep hygiene guidelines Recommendation Details  Regular bedtime and rise time Having a consistent bedtime and rise time leads to more regular sleep schedules and avoids periods of sleep deprivation or periods of extended wakefulness during the night.  Avoid napping Avoid napping, especially naps lasting longer than 1 hour and naps late in the day.  Limit caffeine Avoid caffeine after lunch. The time  between lunch and bedtime represents approximately 2 half-lives for caffeine, and this time window allows for most caffeine to be metabolized before bedtime.  Limit alcohol Recommendations are typically focused on avoiding alcohol near bedtime. Alcohol is initially sedating, but activating as it is metabolized. Alcohol also negatively impacts sleep architecture.  Avoid nicotine Nicotine is a stimulant and should be avoided near bedtime and at night.  Exercise Daytime physical activity is encouraged, in particular, 4 to 6 hours before bedtime, as this may facilitate sleep onset. Rigorous exercise within 2 hours of bedtime is discouraged.  Keep the sleep environment quiet and dark Noise and light exposure during the night can disrupt sleep. White noise or ear plugs are often recommended to reduce noise. Using blackout shades or an eye mask is commonly recommended to reduce light. This may also include avoiding exposure to television or technology near bedtime, as this can have an impact on circadian rhythms by shifting sleep timing later.   Bedroom clock Avoid checking the time at night. This includes alarm clocks and other time pieces (eg, watches and smart phones). Checking the time increases cognitive arousal and prolongs wakefulness.  Evening eating Avoid a large meal near bedtime, but don't go to bed hungry. Eat a healthy and filling meal in the evening and avoid late-night snacks.    Outpatient Mental Health Providers (No Insurance required or Self Pay)  [facility]  [address] [place], [place] [employer]  Line [phone] Crisis [phone]  [facility]) can see uninsured folks for outpatient therapy [email or web address] [address] [place], [place] [address] [phone]  [facility]    Walk-in Mon-Fri, 8am-3pm [email or web address] Scull [address], [place], [place]  [phone]   [address]  [place] [phone] [facility]  [facility] for psych med management, there may be a wait- if [facility] is working with clients for OPT, they will coordinate with [facility] for psych  [facility]   Walk-in-Clinic: Monday- Friday 9:00 AM - 4:00 PM [address]   [place], [place] [phone]  [facility] [place] ([employer]) walk in M-F 8am-12pm and  1pm-3pm [place]- [address]     [phone]  [employer] -[address]  Phone: [phone]  [employer] (Mental Health and substance challenges) [address]   [place] [phone]    [email or web address]    [facility]  [place] -[address] [place], [place]     Phone:  [phone] [place]-  [address]  [phone]   [facility]  [address] [place]  [phone] [email or web address]   [facility] ( virtual or zoom therapy) [email or web address]  [address] [place]  [phone]    [facility] [phone]  grief counseling, dementia and caregiver support    [facility] Walk-in MWF 12:30 to 3:00     [address] [place] [address]  [phone]  [email or web address].[email or web address] call to schedule an appointment    [facility]ental [facility] Classes ,Support group, Peer support services, [address] [place], [place] [address] [phone]  PhotoSolver.pl           National [facility] on Mental Illness (NAMI) [place]- Wellness classes, Support groups        [address], [place], [place] [address] [phone]   [email or web address]   [facility]  (Psycho-social Rehabilitation clubhouse, Individual and group therapy) [address] [place], [place] [address]   [phone]  24- Hour Availability:  [facility] [phone] or [phone] * [facility] [employer] (Domestic Violence, Rape, etc. )[phone]  [phone] or [phone] * [facility] [phone] only) [phone] (after hours) *[facility] Mobile Crisis Unit [phone] *[place] National Suicide Hotline [phone] Len Childs)

## 2020-02-25 ENCOUNTER — Ambulatory Visit (INDEPENDENT_AMBULATORY_CARE_PROVIDER_SITE_OTHER): Payer: Medicaid Other | Admitting: Family Medicine

## 2020-02-25 ENCOUNTER — Other Ambulatory Visit: Payer: Self-pay

## 2020-02-25 VITALS — BP 112/74 | HR 108 | Ht 63.0 in | Wt 336.4 lb

## 2020-02-25 DIAGNOSIS — F419 Anxiety disorder, unspecified: Secondary | ICD-10-CM

## 2020-02-25 DIAGNOSIS — N926 Irregular menstruation, unspecified: Secondary | ICD-10-CM

## 2020-02-25 DIAGNOSIS — Z30013 Encounter for initial prescription of injectable contraceptive: Secondary | ICD-10-CM

## 2020-02-25 DIAGNOSIS — R5383 Other fatigue: Secondary | ICD-10-CM | POA: Diagnosis not present

## 2020-02-25 DIAGNOSIS — R000 Tachycardia, unspecified: Secondary | ICD-10-CM

## 2020-02-25 LAB — POCT URINE PREGNANCY: Preg Test, Ur: NEGATIVE

## 2020-02-25 MED ORDER — MEDROXYPROGESTERONE ACETATE 150 MG/ML IM SUSP
150.0000 mg | Freq: Once | INTRAMUSCULAR | Status: AC
  Administered 2020-02-25: 150 mg via INTRAMUSCULAR

## 2020-02-26 DIAGNOSIS — F411 Generalized anxiety disorder: Secondary | ICD-10-CM | POA: Diagnosis not present

## 2020-02-28 ENCOUNTER — Encounter: Payer: Self-pay | Admitting: Family Medicine

## 2020-02-28 DIAGNOSIS — R000 Tachycardia, unspecified: Secondary | ICD-10-CM | POA: Insufficient documentation

## 2020-02-28 DIAGNOSIS — R5383 Other fatigue: Secondary | ICD-10-CM | POA: Insufficient documentation

## 2020-02-28 NOTE — Assessment & Plan Note (Signed)
Continues to have anxiety. Doesn't want to start medication. -Encouraged to seek medical therapy -Can start SSRI at patients request -Mental health resources provided -Follow up as needed

## 2020-02-28 NOTE — Assessment & Plan Note (Signed)
Started OCP for irregular menses but not tolerating mediations well,causing abdominal cramping -UPT negative -Stop current method of OCP -Start Depo injection q3/12, first dose today -Follow up RN clinic for next injection 3/12 -Follow up as needed

## 2020-02-28 NOTE — Assessment & Plan Note (Signed)
Chronic Tachycardia. Asymptomatic. Last TSH 1.880.Likelysecondary to anxiety. -Continue to monitor -Follow upasneeded

## 2020-02-28 NOTE — Assessment & Plan Note (Signed)
Excessive daytime fatigue.  Snores at night.  Denies an  SOB or chest pain. BMI 59.9kg.Likey a contrinibuting factor. -Sleep studied forevaluation of OSA -Sleep hygiene instructions provided -Follow up as needed

## 2020-03-01 DIAGNOSIS — Z419 Encounter for procedure for purposes other than remedying health state, unspecified: Secondary | ICD-10-CM | POA: Diagnosis not present

## 2020-03-03 ENCOUNTER — Ambulatory Visit: Payer: Self-pay | Admitting: Surgery

## 2020-03-03 DIAGNOSIS — L988 Other specified disorders of the skin and subcutaneous tissue: Secondary | ICD-10-CM | POA: Diagnosis not present

## 2020-03-08 DIAGNOSIS — F411 Generalized anxiety disorder: Secondary | ICD-10-CM | POA: Diagnosis not present

## 2020-03-09 ENCOUNTER — Encounter: Payer: Self-pay | Admitting: Gastroenterology

## 2020-03-09 ENCOUNTER — Ambulatory Visit (INDEPENDENT_AMBULATORY_CARE_PROVIDER_SITE_OTHER): Payer: Medicaid Other | Admitting: Gastroenterology

## 2020-03-09 VITALS — BP 124/90 | HR 117 | Ht 63.0 in | Wt 340.0 lb

## 2020-03-09 DIAGNOSIS — K219 Gastro-esophageal reflux disease without esophagitis: Secondary | ICD-10-CM

## 2020-03-09 DIAGNOSIS — R112 Nausea with vomiting, unspecified: Secondary | ICD-10-CM | POA: Diagnosis not present

## 2020-03-09 MED ORDER — PANTOPRAZOLE SODIUM 40 MG PO TBEC: 40.0000 mg | DELAYED_RELEASE_TABLET | Freq: Two times a day (BID) | ORAL | 3 refills | Status: DC

## 2020-03-09 NOTE — Progress Notes (Signed)
Referring Provider: Moses Manners, MD Primary Care Physician:  Dana Allan, MD  Reason for Consultation:  Nausea and vomiting   IMPRESSION:  Recurrent Nausea and Vomiting    - not explained by CT    - normal liver enzymes and lipase    - negative H pylori breath test BMI 60.23  Symptoms are too frequent for Rome IV criteria for cyclic vomiting syndrome.  EGD recommended to rule out gastric and intestinal pathology such as esophagitis, reflux, eosinophilic esophagitis or gastritis, gastric outlet obstruction. Differential also includes gastroparesis and symptomatic gallbladder disease.    PLAN: Increase pantoprazole to 40 mg BID EGD with esophageal and gastric biopsies to be performed at the hospital Plan gastric emptying scan +/- HIDA if EGD is negative  Please see the "Patient Instructions" section for addition details about the plan.  HPI: Katelyn Aguilar is a 20 y.o. female referred by Dr. Leveda Anna for further evaluation of nausea and vomiting. Her mother is my patient. She has depression. Rare migraines.   Wakes up with morning phelgm. Intermittent postprandial nausea, vomiting, and abdominal pain that occurs immediately after eating for many years. No bloating, early satiety. No identified food triggers except for eggs. Appetite is good. Gaining weight with some concurrent exacerbation in symptoms.  Denies component of anxiety. No abdominal pain, hematemesis, regurgitation, heartburn, blood in the stool. Migraines do not occur concurrently with her GI symptoms.   Drinks coffee and Monsters - not daily.  No tobacco or E cigareetes.  Delta 8 gummies provided some relief.  No marijuana Rare alcohol with Seagrams Coolers.   Normal LFTs and lipase 09/11/19 CT ab/pelvis with contrast 09/12/19: no acute process H pylori breath test negative 10/06/19  Trial of famotidine provided no relief. Pantoprazole initially provided some relief, but, it isn't working as well as it has.    Has upcoming surgery for pilonidal cyst.   Mother with symptomatic gallstones requiring cholecystectomy. No known family history of colon cancer or polyps. No family history of uterine/endometrial cancer, pancreatic cancer or gastric/stomach cancer.   Past Medical History:  Diagnosis Date  . Allergic rhinitis   . GERD (gastroesophageal reflux disease)   . Obesity     Past Surgical History:  Procedure Laterality Date  . NO PAST SURGERIES      Current Outpatient Medications  Medication Sig Dispense Refill  . acetaminophen (TYLENOL) 325 MG tablet Take 650 mg by mouth every 6 (six) hours as needed.    . ARIPiprazole (ABILIFY) 5 MG tablet Take 1 tablet (5 mg total) by mouth daily. 30 tablet 1  . cetirizine (ZYRTEC) 10 MG tablet Take 1 tablet (10 mg total) by mouth daily. 30 tablet 11  . pantoprazole (PROTONIX) 40 MG tablet Take 1 tablet (40 mg total) by mouth 2 (two) times daily. 60 tablet 3   No current facility-administered medications for this visit.    Allergies as of 03/09/2020  . (No Known Allergies)    Family History  Problem Relation Age of Onset  . COPD Mother   . Depression Mother   . Cancer Mother        blood cancer  . Diabetes Other     Social History   Socioeconomic History  . Marital status: Single    Spouse name: Not on file  . Number of children: 0  . Years of education: 24  . Highest education level: Not on file  Occupational History  . Occupation: Security job  Tobacco Use  . Smoking  status: Passive Smoke Exposure - Never Smoker  . Smokeless tobacco: Never Used  Substance and Sexual Activity  . Alcohol use: No  . Drug use: No  . Sexual activity: Never    Birth control/protection: None  Other Topics Concern  . Not on file  Social History Narrative   Right handed    Lives with mother, step dad, brother (younger), and fiance   Caffeine use: sometimes   Social Determinants of Corporate investment banker Strain: Not on file  Food  Insecurity: Food Insecurity Present  . Worried About Programme researcher, broadcasting/film/video in the Last Year: Often true  . Ran Out of Food in the Last Year: Sometimes true  Transportation Needs: No Transportation Needs  . Lack of Transportation (Medical): No  . Lack of Transportation (Non-Medical): No  Physical Activity: Not on file  Stress: Stress Concern Present  . Feeling of Stress : Rather much  Social Connections: Not on file  Intimate Partner Violence: Not on file    Review of Systems: 12 system ROS is negative except as noted above with the addition of depression, muscle pains, and insomnia.   Physical Exam: General:   Alert,  well-nourished, pleasant and cooperative in NAD Head:  Normocephalic and atraumatic. Eyes:  Sclera clear, no icterus.   Conjunctiva pink. Ears:  Normal auditory acuity. Nose:  No deformity, discharge,  or lesions. Mouth:  No deformity or lesions.   Neck:  Supple; no masses or thyromegaly. Lungs:  Clear throughout to auscultation.   No wheezes. Heart:  Regular rate and rhythm; no murmurs. Abdomen:  Soft, central obesity, nontender, nondistended, normal bowel sounds, no rebound or guarding. No hepatosplenomegaly.   Rectal:  Deferred  Msk:  Symmetrical. No boney deformities LAD: No inguinal or umbilical LAD Extremities:  No clubbing or edema. Neurologic:  Alert and  oriented x4;  grossly nonfocal Skin:  Intact without significant lesions or rashes. Psych:  Alert and cooperative. Normal mood and affect.   Fredi Hurtado L. Orvan Falconer, MD, MPH 03/13/2020, 5:16 PM

## 2020-03-09 NOTE — Patient Instructions (Addendum)
RECOMMENDATION(S):   I have recommended that you increase your pantoprazole to twice daily.  PRESCRIPTION MEDICATION(S):   We have sent the following medication(s) to your pharmacy:  . Pantoprazole - please take 40mg  by mouth twice daily  NOTE: If your medication(s) requires a PRIOR AUTHORIZATION, we will receive notification from your pharmacy. Once received, the process to submit for approval may take up to 7-10 business days. You will be contacted about any denials we have received from your insurance company as well as alternatives recommended by your provider.   Avoid any dietary triggers.   Avoid spicy and acidic foods as this may worsen your symptoms.  Limit your intake of coffee, tea, alcohol, and carbonated drinks.  Work to achieve a healthy weight.  Keep the head of the bed elevated with blocks if you are having any nighttime symptoms  Stay upright for 2 hours after eating.  Avoid meals and snacks three to four hours before bedtime.  ENDOSCOPY:   . You have been scheduled for an endoscopy. Please follow written instructions given to you at your visit today.  INHALERS:   . If you use inhalers (even only as needed), please bring them with you on the day of your procedure.  BMI:  If you are age 62 or younger, your body mass index should be between 19-25. Your Body mass index is 60.23 kg/m. If this is out of the aformentioned range listed, please consider follow up with your Primary Care Provider.   Thank you for trusting me with your gastrointestinal care!    03-03-1970, MD, MPH

## 2020-03-11 ENCOUNTER — Ambulatory Visit (INDEPENDENT_AMBULATORY_CARE_PROVIDER_SITE_OTHER): Payer: Medicaid Other

## 2020-03-11 ENCOUNTER — Other Ambulatory Visit: Payer: Self-pay

## 2020-03-11 DIAGNOSIS — Z23 Encounter for immunization: Secondary | ICD-10-CM

## 2020-03-13 ENCOUNTER — Encounter: Payer: Self-pay | Admitting: Gastroenterology

## 2020-03-23 DIAGNOSIS — F411 Generalized anxiety disorder: Secondary | ICD-10-CM | POA: Diagnosis not present

## 2020-03-29 DIAGNOSIS — Z419 Encounter for procedure for purposes other than remedying health state, unspecified: Secondary | ICD-10-CM | POA: Diagnosis not present

## 2020-04-04 DIAGNOSIS — F411 Generalized anxiety disorder: Secondary | ICD-10-CM | POA: Diagnosis not present

## 2020-04-07 ENCOUNTER — Ambulatory Visit: Payer: Medicaid Other | Admitting: Neurology

## 2020-04-07 DIAGNOSIS — L988 Other specified disorders of the skin and subcutaneous tissue: Secondary | ICD-10-CM | POA: Diagnosis not present

## 2020-04-11 ENCOUNTER — Encounter: Payer: Self-pay | Admitting: Family Medicine

## 2020-04-12 ENCOUNTER — Other Ambulatory Visit: Payer: Self-pay

## 2020-04-14 NOTE — Telephone Encounter (Signed)
I am not familiar with the necessary requirements for ESA.  If she has a therapist she can and ask for recommendations.  Thanks

## 2020-04-15 ENCOUNTER — Other Ambulatory Visit (HOSPITAL_COMMUNITY)
Admission: RE | Admit: 2020-04-15 | Discharge: 2020-04-15 | Disposition: A | Discharge status: Home / Self Care | Payer: Medicaid Other | Source: Ambulatory Visit | Attending: Gastroenterology | Admitting: Gastroenterology

## 2020-04-15 DIAGNOSIS — Z20822 Contact with and (suspected) exposure to covid-19: Secondary | ICD-10-CM | POA: Insufficient documentation

## 2020-04-15 DIAGNOSIS — Z01812 Encounter for preprocedural laboratory examination: Secondary | ICD-10-CM | POA: Diagnosis not present

## 2020-04-15 LAB — SARS CORONAVIRUS 2 (TAT 6-24 HRS): SARS Coronavirus 2: NEGATIVE

## 2020-04-18 ENCOUNTER — Encounter: Payer: Self-pay | Admitting: Surgery

## 2020-04-18 DIAGNOSIS — L0591 Pilonidal cyst without abscess: Secondary | ICD-10-CM | POA: Diagnosis present

## 2020-04-18 DIAGNOSIS — F411 Generalized anxiety disorder: Secondary | ICD-10-CM | POA: Diagnosis not present

## 2020-04-18 HISTORY — DX: Pilonidal cyst without abscess: L05.91

## 2020-04-18 NOTE — H&P (Signed)
General Surgery [facility]  Katelyn Aguilar DOB: 2000-12-02 Single / Language: [place] / Race: White Female   History of Present Illness   The patient is a 20 year old female who presents with a pilonidal cyst.  CHIEF COMPLAINT: pilonidal disease  Patient is referred by Dr. Dana Allan and Dr. Doralee Albino for surgical evaluation and management of pilonidal disease. Patient first developed symptoms a few months ago. She underwent incision and drainage of an abscess from the natal cleft at an urgent care. She had recurrence of drainage and required a repeat procedure at the family practice center. Wound has not healed and she has had continued drainage. Diagnosis of pilonidal disease was made and the patient has been referred to surgery for further evaluation and management. Patient has never had a definitive excision. She denies being diabetic. She is morbidly obese with a BMI of 59. Patient presents today to discuss definitive surgical management.   Past Surgical History  No pertinent past surgical history   Diagnostic Studies History  Colonoscopy  never Mammogram  never Pap Smear  never  Allergies  No Known Drug Allergies   Medication History No Current Medications Medications Reconciled  Social History  Caffeine use  Carbonated beverages, Coffee. No alcohol use  No drug use  Tobacco use  Never smoker.  Family History  Arthritis  Mother. Cancer  Mother. Migraine Headache  Mother. Respiratory Condition  Mother.  Pregnancy / Birth History Age at menarche  12 years. Gravida  0 Para  0 Regular periods   Other Problems  Anxiety Disorder   Review of Systems  General Present- Weight Gain. Not Present- Appetite Loss, Chills, Fatigue, Fever, Night Sweats and Weight Loss. Skin Not Present- Change in Wart/Mole, Dryness, Hives, Jaundice, New Lesions, Non-Healing Wounds, Rash and Ulcer. HEENT Present- Seasonal Allergies.  Not Present- Earache, Hearing Loss, Hoarseness, Nose Bleed, Oral Ulcers, Ringing in the Ears, Sinus Pain, Sore Throat, Visual Disturbances, Wears glasses/contact lenses and Yellow Eyes. Respiratory Present- Snoring. Not Present- Bloody sputum, Chronic Cough, Difficulty Breathing and Wheezing. Cardiovascular Present- Leg Cramps. Not Present- Chest Pain, Difficulty Breathing Lying Down, Palpitations, Rapid Heart Rate, Shortness of Breath and Swelling of Extremities. Gastrointestinal Present- Vomiting. Not Present- Abdominal Pain, Bloating, Bloody Stool, Change in Bowel Habits, Chronic diarrhea, Constipation, Difficulty Swallowing, Excessive gas, Gets full quickly at meals, Hemorrhoids, Indigestion, Nausea and Rectal Pain. Female Genitourinary Not Present- Frequency, Nocturia, Painful Urination, Pelvic Pain and Urgency. Neurological Not Present- Decreased Memory, Fainting, Headaches, Numbness, Seizures, Tingling, Tremor, Trouble walking and Weakness. Psychiatric Present- Anxiety. Not Present- Bipolar, Change in Sleep Pattern, Depression, Fearful and Frequent crying. Endocrine Not Present- Cold Intolerance, Excessive Hunger, Hair Changes, Heat Intolerance, Hot flashes and New Diabetes. Hematology Not Present- Blood Thinners, Easy Bruising, Excessive bleeding, Gland problems, HIV and Persistent Infections.  Vitals  Weight: 337.5 lb Height: 63in Body Surface Area: 2.42 m Body Mass Index: 59.78 kg/m  Temp.: 97.21F  Pulse: 62 (Regular)  P.OX: 99% (Room air) BP: 140/80(Sitting, Left Arm, Standard)  Physical Exam   GENERAL APPEARANCE Development: normal Nutritional status: normal Gross deformities: none  SKIN Rash, lesions, ulcers: In the natal cleft there is a 1 cm open sinus with hair and debris visible within the tract. There are at least 2 more pinpoint sinus tracts slightly more cephalad within the natal cleft. There is mild tenderness. There is evidence of drainage. There is no sign  of cellulitis. Induration, erythema: none Nodules: none palpable  EYES Conjunctiva and lids:  normal Pupils: equal and reactive Iris: normal bilaterally  EARS, NOSE, MOUTH, THROAT External ears: no lesion or deformity External nose: no lesion or deformity Hearing: grossly normal Due to Covid-19 pandemic, patient is wearing a mask.  NECK Symmetric: yes Trachea: midline Thyroid: no palpable nodules in the thyroid bed  CHEST Respiratory effort: normal Retraction or accessory muscle use: no Breath sounds: normal bilaterally Rales, rhonchi, wheeze: none  CARDIOVASCULAR Auscultation: regular rhythm, normal rate Murmurs: none Pulses: radial pulse 2+ palpable Lower extremity edema: none  MUSCULOSKELETAL Station and gait: normal Digits and nails: no clubbing or cyanosis Muscle strength: grossly normal all extremities Range of motion: grossly normal all extremities Deformity: none  LYMPHATIC Cervical: none palpable Supraclavicular: none palpable  PSYCHIATRIC Oriented to person, place, and time: yes Mood and affect: normal for situation Judgment and insight: appropriate for situation    Assessment & Plan   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 Patient is provided with written literature on pilonidal disease and the surgical treatment of pilonidal disease.  Patient has a large sinus tract in the natal cleft as well as smaller sinus tracts adjacent within the natal cleft. There is no obvious abscess or fistulae at this time. We discussed the surgical management which would involve excision of the skin and sinus tracts and underlying pilonidal cavities from the natal cleft under general anesthesia. This will likely result in a sizable open wound measuring several centimeters in length and in width and in depth. We will not make any attempt to close this wound but we'll  pack the wound open with gauze. The wound will require twice-daily dressing changes and need to heal by secondary intention over a period of approximately 6 weeks.  Due to the patient's size, we will make arrangements for surgery in the main hospital operating room. We will plan to observe her overnight and do her first dressing change the following morning. We will then arrange for a home health nurse to come to the house and instruct the patient's family and wound care. We will plan to see the patient back in our office every 2-3 weeks and follow her wound care progress. Patient understands and agrees to proceed with this plan.  The risks and benefits of the procedure have been discussed at length with the patient. The patient understands the proposed procedure, potential alternative treatments, and the course of recovery to be expected. All of the patient's questions have been answered at this time. The patient wishes to proceed with surgery.  Darnell Level, MD [facility] Office: [phone]

## 2020-04-19 ENCOUNTER — Ambulatory Visit (HOSPITAL_COMMUNITY): Payer: Medicaid Other | Admitting: Certified Registered"

## 2020-04-19 ENCOUNTER — Other Ambulatory Visit (HOSPITAL_COMMUNITY): Payer: Self-pay | Admitting: *Deleted

## 2020-04-19 ENCOUNTER — Encounter (HOSPITAL_COMMUNITY): Payer: Self-pay | Admitting: Gastroenterology

## 2020-04-19 ENCOUNTER — Ambulatory Visit (HOSPITAL_COMMUNITY)
Admission: RE | Admit: 2020-04-19 | Discharge: 2020-04-19 | Disposition: A | Discharge status: Home / Self Care | Payer: Medicaid Other | Attending: Gastroenterology | Admitting: Gastroenterology

## 2020-04-19 ENCOUNTER — Encounter (HOSPITAL_COMMUNITY)
Admission: RE | Disposition: A | Discharge status: Home / Self Care | Payer: Self-pay | Source: Home / Self Care | Attending: Gastroenterology

## 2020-04-19 ENCOUNTER — Other Ambulatory Visit: Payer: Self-pay

## 2020-04-19 DIAGNOSIS — F419 Anxiety disorder, unspecified: Secondary | ICD-10-CM | POA: Diagnosis not present

## 2020-04-19 DIAGNOSIS — Z825 Family history of asthma and other chronic lower respiratory diseases: Secondary | ICD-10-CM | POA: Diagnosis not present

## 2020-04-19 DIAGNOSIS — R112 Nausea with vomiting, unspecified: Secondary | ICD-10-CM | POA: Diagnosis not present

## 2020-04-19 DIAGNOSIS — K2970 Gastritis, unspecified, without bleeding: Secondary | ICD-10-CM | POA: Diagnosis not present

## 2020-04-19 DIAGNOSIS — K259 Gastric ulcer, unspecified as acute or chronic, without hemorrhage or perforation: Secondary | ICD-10-CM | POA: Diagnosis not present

## 2020-04-19 DIAGNOSIS — K3189 Other diseases of stomach and duodenum: Secondary | ICD-10-CM | POA: Insufficient documentation

## 2020-04-19 DIAGNOSIS — Z833 Family history of diabetes mellitus: Secondary | ICD-10-CM | POA: Insufficient documentation

## 2020-04-19 DIAGNOSIS — Z808 Family history of malignant neoplasm of other organs or systems: Secondary | ICD-10-CM | POA: Diagnosis not present

## 2020-04-19 DIAGNOSIS — K219 Gastro-esophageal reflux disease without esophagitis: Secondary | ICD-10-CM | POA: Insufficient documentation

## 2020-04-19 HISTORY — PX: ESOPHAGOGASTRODUODENOSCOPY (EGD) WITH PROPOFOL: SHX5813

## 2020-04-19 HISTORY — PX: BIOPSY: SHX5522

## 2020-04-19 LAB — PREGNANCY, URINE: Preg Test, Ur: NEGATIVE

## 2020-04-19 SURGERY — ESOPHAGOGASTRODUODENOSCOPY (EGD) WITH PROPOFOL
Anesthesia: General

## 2020-04-19 MED ORDER — DEXAMETHASONE SODIUM PHOSPHATE 10 MG/ML IJ SOLN
INTRAMUSCULAR | Status: DC | PRN
  Administered 2020-04-19: 5 mg via INTRAVENOUS

## 2020-04-19 MED ORDER — SUCCINYLCHOLINE CHLORIDE 200 MG/10ML IV SOSY
PREFILLED_SYRINGE | INTRAVENOUS | Status: DC | PRN
  Administered 2020-04-19: 200 mg via INTRAVENOUS

## 2020-04-19 MED ORDER — FENTANYL CITRATE (PF) 100 MCG/2ML IJ SOLN
INTRAMUSCULAR | Status: DC | PRN
  Administered 2020-04-19: 50 ug via INTRAVENOUS

## 2020-04-19 MED ORDER — LIDOCAINE 2% (20 MG/ML) 5 ML SYRINGE
INTRAMUSCULAR | Status: DC | PRN
  Administered 2020-04-19: 100 mg via INTRAVENOUS

## 2020-04-19 MED ORDER — MIDAZOLAM HCL 5 MG/5ML IJ SOLN
INTRAMUSCULAR | Status: DC | PRN
  Administered 2020-04-19: 2 mg via INTRAVENOUS

## 2020-04-19 MED ORDER — MIDAZOLAM HCL 2 MG/2ML IJ SOLN
INTRAMUSCULAR | Status: AC
  Filled 2020-04-19: qty 2

## 2020-04-19 MED ORDER — ONDANSETRON HCL 4 MG/2ML IJ SOLN
INTRAMUSCULAR | Status: DC | PRN
  Administered 2020-04-19 (×2): 4 mg via INTRAVENOUS

## 2020-04-19 MED ORDER — LACTATED RINGERS IV SOLN
INTRAVENOUS | Status: DC
  Administered 2020-04-19: 1000 mL via INTRAVENOUS

## 2020-04-19 MED ORDER — PROPOFOL 10 MG/ML IV BOLUS
INTRAVENOUS | Status: DC | PRN
  Administered 2020-04-19: 200 mg via INTRAVENOUS

## 2020-04-19 MED ORDER — FAMOTIDINE 20 MG PO TABS: 20.0000 mg | ORAL_TABLET | Freq: Two times a day (BID) | ORAL | 1 refills | Status: DC

## 2020-04-19 MED ORDER — SODIUM CHLORIDE 0.9 % IV SOLN: INTRAVENOUS | Status: DC

## 2020-04-19 MED ORDER — FENTANYL CITRATE (PF) 100 MCG/2ML IJ SOLN
INTRAMUSCULAR | Status: AC
  Filled 2020-04-19: qty 2

## 2020-04-19 SURGICAL SUPPLY — 14 items

## 2020-04-19 NOTE — Transfer of Care (Signed)
Immediate Anesthesia Transfer of Care Note  Patient: Katelyn Aguilar  Procedure(s) Performed: ESOPHAGOGASTRODUODENOSCOPY (EGD) WITH PROPOFOL (N/A ) BIOPSY  Patient Location: PACU  Anesthesia Type:General  Level of Consciousness: awake, alert  and oriented  Airway & Oxygen Therapy: Patient Spontanous Breathing and Patient connected to face mask oxygen  Post-op Assessment: Report given to RN and Post -op Vital signs reviewed and stable  Post vital signs: Reviewed and stable  Last Vitals:  Vitals Value Taken Time  BP 117/57 04/19/20 1141  Temp    Pulse 102 04/19/20 1143  Resp 24 04/19/20 1143  SpO2 99 % 04/19/20 1143  Vitals shown include unvalidated device data.  Last Pain:  Vitals:   04/19/20 1029  TempSrc: Oral  PainSc: 0-No pain         Complications: No complications documented.

## 2020-04-19 NOTE — Anesthesia Postprocedure Evaluation (Signed)
Anesthesia Post Note  Patient: JEYLI ZWICKER  Procedure(s) Performed: ESOPHAGOGASTRODUODENOSCOPY (EGD) WITH PROPOFOL (N/A ) BIOPSY     Patient location during evaluation: PACU Anesthesia Type: General Level of consciousness: awake and alert and oriented Pain management: pain level controlled Vital Signs Assessment: post-procedure vital signs reviewed and stable Respiratory status: spontaneous breathing, nonlabored ventilation and respiratory function stable Cardiovascular status: blood pressure returned to baseline Postop Assessment: no apparent nausea or vomiting Anesthetic complications: no   No complications documented.  Last Vitals:  Vitals:   04/19/20 1200 04/19/20 1210  BP: 117/61 127/77  Pulse: 99 100  Resp: (!) 23 (!) 21  Temp:    SpO2: 97% 98%    Last Pain:  Vitals:   04/19/20 1210  TempSrc:   PainSc: 0-No pain                 Kaylyn Layer

## 2020-04-19 NOTE — H&P (Signed)
   Referring Provider: No ref. provider found Primary Care Physician:  Dana Allan, MD  Reason for Consultation:  Nausea and vomiting   IMPRESSION:  Recurrent Nausea and Vomiting    - not explained by CT    - normal liver enzymes and lipase    - negative H pylori breath test BMI 60.23  Symptoms are too frequent for Rome IV criteria for cyclic vomiting syndrome.  EGD recommended to rule out gastric and intestinal pathology such as esophagitis, reflux, eosinophilic esophagitis or gastritis, gastric outlet obstruction. Differential also includes gastroparesis and symptomatic gallbladder disease.    PLAN: EGD with esophageal and gastric biopsies to be performed at the hospital Plan gastric emptying scan +/- HIDA if EGD is negative   HPI: Katelyn Aguilar is a 20 y.o. female referred by Dr. Leveda Anna for further evaluation of nausea and vomiting. Her mother is my patient. She has depression. Rare migraines.   Wakes up with morning phelgm. Intermittent postprandial nausea, vomiting, and abdominal pain that occurs immediately after eating for many years. No bloating, early satiety. No identified food triggers except for eggs. Appetite is good. Gaining weight with some concurrent exacerbation in symptoms.  Denies component of anxiety. No abdominal pain, hematemesis, regurgitation, heartburn, blood in the stool. Migraines do not occur concurrently with her GI symptoms.   Drinks coffee and Monsters - not daily.  No tobacco or E cigareetes.  Delta 8 gummies provided some relief.  No marijuana Rare alcohol with Seagrams Coolers.   Normal LFTs and lipase 09/11/19 CT ab/pelvis with contrast 09/12/19: no acute process H pylori breath test negative 10/06/19  Trial of famotidine provided no relief. Pantoprazole initially provided some relief, but, it isn't working as well as it has.   Has upcoming surgery for pilonidal cyst.   Mother with symptomatic gallstones requiring cholecystectomy. No known  family history of colon cancer or polyps. No family history of uterine/endometrial cancer, pancreatic cancer or gastric/stomach cancer.   Past Medical History:  Diagnosis Date  . Allergic rhinitis   . GERD (gastroesophageal reflux disease)   . Obesity     Past Surgical History:  Procedure Laterality Date  . NO PAST SURGERIES      No current facility-administered medications for this encounter.    Allergies as of 03/09/2020  . (No Known Allergies)    Family History  Problem Relation Age of Onset  . COPD Mother   . Depression Mother   . Cancer Mother        blood cancer  . Diabetes Other       Physical Exam: General:   Alert,  well-nourished, pleasant and cooperative in NAD Head:  Normocephalic and atraumatic. Eyes:  Sclera clear, no icterus.   Conjunctiva pink. Ears:  Normal auditory acuity. Nose:  No deformity, discharge,  or lesions. Mouth:  No deformity or lesions.   Neck:  Supple; no masses or thyromegaly. Lungs:  Clear throughout to auscultation.   No wheezes. Heart:  Regular rate and rhythm; no murmurs. Abdomen:  Soft, central obesity, nontender, nondistended, normal bowel sounds, no rebound or guarding. No hepatosplenomegaly.   Rectal:  Deferred  Msk:  Symmetrical. No boney deformities LAD: No inguinal or umbilical LAD Extremities:  No clubbing or edema. Neurologic:  Alert and  oriented x4;  grossly nonfocal Skin:  Intact without significant lesions or rashes. Psych:  Alert and cooperative. Normal mood and affect.   Kimberly L. Orvan Falconer, MD, MPH 04/19/2020, 9:46 AM

## 2020-04-19 NOTE — Discharge Instructions (Signed)
YOU HAD AN ENDOSCOPIC PROCEDURE TODAY: Refer to the procedure report and other information in the discharge instructions given to you for any specific questions about what was found during the examination. If this information does not answer your questions, please call [place] office at [phone] to clarify.   YOU SHOULD EXPECT: Some feelings of bloating in the abdomen. Passage of more gas than usual. Walking can help get rid of the air that was put into your GI tract during the procedure and reduce the bloating. If you had a lower endoscopy (such as a colonoscopy or flexible sigmoidoscopy) you may notice spotting of blood in your stool or on the toilet paper. Some abdominal soreness may be present for a day or two, also.  DIET: Your first meal following the procedure should be a light meal and then it is ok to progress to your normal diet. A half-sandwich or bowl of soup is an example of a good first meal. Heavy or fried foods are harder to digest and may make you feel nauseous or bloated. Drink plenty of fluids but you should avoid alcoholic beverages for 24 hours. If you had a esophageal dilation, please see attached instructions for diet.    ACTIVITY: Your care partner should take you home directly after the procedure. You should plan to take it easy, moving slowly for the rest of the day. You can resume normal activity the day after the procedure however YOU SHOULD NOT DRIVE, use power tools, machinery or perform tasks that involve climbing or major physical exertion for 24 hours (because of the sedation medicines used during the test).   SYMPTOMS TO REPORT IMMEDIATELY: A gastroenterologist can be reached at any hour. Please call [phone]  for any of the following symptoms:   Following upper endoscopy (EGD, EUS, ERCP, esophageal dilation) Vomiting of blood or coffee ground material  New, significant abdominal pain  New, significant chest pain or pain under the shoulder blades  Painful or  persistently difficult swallowing  New shortness of breath  Black, tarry-looking or red, bloody stools  FOLLOW UP:  If any biopsies were taken you will be contacted by phone or by letter within the next 1-3 weeks. Call [phone]  if you have not heard about the biopsies in 3 weeks.  Please also call with any specific questions about appointments or follow up tests.  

## 2020-04-19 NOTE — Anesthesia Procedure Notes (Addendum)
Procedure Name: Intubation Date/Time: 04/19/2020 11:17 AM Performed by: Marny Lowenstein, CRNA Pre-anesthesia Checklist: Patient identified, Emergency Drugs available, Suction available and Patient being monitored Patient Re-evaluated:Patient Re-evaluated prior to induction Oxygen Delivery Method: Circle system utilized Preoxygenation: Pre-oxygenation with 100% oxygen Induction Type: IV induction Ventilation: Mask ventilation without difficulty Laryngoscope Size: Miller and 2 Grade View: Grade I Tube type: Oral Tube size: 6.0 mm Number of attempts: 1 Airway Equipment and Method: Patient positioned with wedge pillow and Stylet Placement Confirmation: ETT inserted through vocal cords under direct vision,  positive ETCO2 and breath sounds checked- equal and bilateral Secured at: 20 cm Tube secured with: Tape Dental Injury: Teeth and Oropharynx as per pre-operative assessment

## 2020-04-19 NOTE — Op Note (Signed)
[facility] Patient Name: Katelyn Aguilar Procedure Date: 04/19/2020 MRN: [id number] Attending MD: Tressia Danas MD, MD Date of Birth: Jun 27, 2000 CSN: [id number] Age: 20 Admit Type: Outpatient Procedure:                Upper GI endoscopy Indications:              Nausea with vomiting not explained by CT                           Liver enzymes and liapse are normal Providers:                Tressia Danas MD, MD, Fayrene Fearing, RN, Brion Aliment, Technician Referring MD:              Medicines:                General Anesthesia Complications:            No immediate complications. Estimated blood loss:                            Minimal. Estimated Blood Loss:     Estimated blood loss was minimal. Procedure:                Pre-Anesthesia Assessment:                           - Prior to the procedure, a History and Physical                            was performed, and patient medications and                            allergies were reviewed. The patient's tolerance of                            previous anesthesia was also reviewed. The risks                            and benefits of the procedure and the sedation                            options and risks were discussed with the patient.                            All questions were answered, and informed consent                            was obtained. Prior Anticoagulants: The patient has                            taken no previous anticoagulant or antiplatelet                            agents. ASA  Grade Assessment: III - A patient with                            severe systemic disease. After reviewing the risks                            and benefits, the patient was deemed in                            satisfactory condition to undergo the procedure.                           After obtaining informed consent, the endoscope was                            passed under direct  vision. Throughout the                            procedure, the patient's blood pressure, pulse, and                            oxygen saturations were monitored continuously. The                            PCF-H190DL ([id number]) Olympus pediatric colonscope                            was introduced through the mouth, and advanced to                            the third part of duodenum. The upper GI endoscopy                            was accomplished without difficulty. The patient                            tolerated the procedure well. Scope In: Scope Out: Findings:      The examined esophagus was normal. Biopsies were taken from the       mid/proximal and distal esophagus with a cold forceps for histology.       Estimated blood loss was minimal.      A few small erosions with no bleeding and no stigmata of recent bleeding       were found in the gastric antrum. Biopsies were taken from the antrum,       body, and fundus with a cold forceps for histology. Estimated blood loss       was minimal.      The examined duodenum was normal. Biopsies were taken with a cold       forceps for histology. Estimated blood loss was minimal.      The cardia and gastric fundus were normal on retroflexion.      The exam was otherwise without abnormality. Impression:               - Normal esophagus. Biopsied.                           -  Mild erosive gastropathy with no bleeding and no                            stigmata of recent bleeding. Biopsied.                           - Normal examined duodenum. Biopsied.                           - The examination was otherwise normal. Moderate Sedation:      Not Applicable - Patient had care per Anesthesia. Recommendation:           - Discharge patient to home.                           - Resume previous diet.                           - Continue present medications.                           - Add famotidine 20 mg BID to pantoprazole 40 mg                             BID.                           - No aspirin, ibuprofen, naproxen, or other                            non-steroidal anti-inflammatory drugs.                           - Await pathology results.                           - Follow-up in the office to review these results. Procedure Code(s):        --- Professional ---                           [phone], Esophagogastroduodenoscopy, flexible,                            transoral; with biopsy, single or multiple Diagnosis Code(s):        --- Professional ---                           K31.89, Other diseases of stomach and duodenum                           R11.2, Nausea with vomiting, unspecified CPT copyright 2019 American Medical Association. All rights reserved. The codes documented in this report are preliminary and upon coder review may  be revised to meet current compliance requirements. Tressia Danas MD, MD 04/19/2020 11:38:19 AM This report has been signed electronically. Number of Addenda: 0

## 2020-04-19 NOTE — Anesthesia Preprocedure Evaluation (Addendum)
Anesthesia Evaluation  Patient identified by MRN, date of birth, ID band Patient awake    Reviewed: Allergy & Precautions, NPO status , Patient's Chart, lab work & pertinent test results  History of Anesthesia Complications Negative for: history of anesthetic complications  Airway Mallampati: IV  TM Distance: >3 FB Neck ROM: Full    Dental no notable dental hx.    Pulmonary asthma ,    Pulmonary exam normal        Cardiovascular negative cardio ROS Normal cardiovascular exam     Neuro/Psych Anxiety negative neurological ROS     GI/Hepatic Neg liver ROS, GERD  Medicated,N/V   Endo/Other  Morbid obesity (BMI 60)  Renal/GU negative Renal ROS  negative genitourinary   Musculoskeletal negative musculoskeletal ROS (+)   Abdominal   Peds  Hematology negative hematology ROS (+)   Anesthesia Other Findings Day of surgery medications reviewed with patient.  Reproductive/Obstetrics negative OB ROS                            Anesthesia Physical Anesthesia Plan  ASA: IV  Anesthesia Plan: General   Post-op Pain Management:    Induction: Intravenous  PONV Risk Score and Plan: 3 and Treatment may vary due to age or medical condition, Ondansetron, Dexamethasone and Midazolam  Airway Management Planned: Oral ETT  Additional Equipment: None  Intra-op Plan:   Post-operative Plan: Extubation in OR  Informed Consent: I have reviewed the patients History and Physical, chart, labs and discussed the procedure including the risks, benefits and alternatives for the proposed anesthesia with the patient or authorized representative who has indicated his/her understanding and acceptance.     Dental advisory given  Plan Discussed with: CRNA  Anesthesia Plan Comments:        Anesthesia Quick Evaluation

## 2020-04-20 ENCOUNTER — Encounter (HOSPITAL_COMMUNITY)
Admission: RE | Admit: 2020-04-20 | Discharge: 2020-04-20 | Disposition: A | Discharge status: Home / Self Care | Payer: Medicaid Other | Source: Ambulatory Visit | Attending: Surgery | Admitting: Surgery

## 2020-04-20 ENCOUNTER — Other Ambulatory Visit: Payer: Self-pay

## 2020-04-20 ENCOUNTER — Encounter (HOSPITAL_COMMUNITY): Payer: Self-pay

## 2020-04-20 DIAGNOSIS — Z01812 Encounter for preprocedural laboratory examination: Secondary | ICD-10-CM | POA: Insufficient documentation

## 2020-04-20 HISTORY — DX: Anxiety disorder, unspecified: F41.9

## 2020-04-20 HISTORY — DX: Cardiac arrhythmia, unspecified: I49.9

## 2020-04-20 LAB — CBC
HCT: 40.1 % (ref 36.0–46.0)
Hemoglobin: 12.9 g/dL (ref 12.0–15.0)
MCH: 27.8 pg (ref 26.0–34.0)
MCHC: 32.2 g/dL (ref 30.0–36.0)
MCV: 86.4 fL (ref 80.0–100.0)
Platelets: 314 10*3/uL (ref 150–400)
RBC: 4.64 MIL/uL (ref 3.87–5.11)
RDW: 13.5 % (ref 11.5–15.5)
WBC: 14.2 10*3/uL — ABNORMAL HIGH (ref 4.0–10.5)
nRBC: 0 % (ref 0.0–0.2)

## 2020-04-20 LAB — SURGICAL PATHOLOGY

## 2020-04-20 NOTE — Progress Notes (Addendum)
COVID Vaccine Completed: Yes Date COVID Vaccine completed: 03/11/20 COVID vaccine manufacturer: Pfizer     PCP - Dr. Dana Allan Cardiologist -   Chest x-ray -  EKG -  Stress Test -  ECHO -  Cardiac Cath -  Pacemaker/ICD device last checked:  Sleep Study -  CPAP -   Fasting Blood Sugar -  Checks Blood Sugar _____ times a day  Blood Thinner Instructions: Aspirin Instructions: Last Dose:  Anesthesia review:   Patient denies shortness of breath, fever, cough and chest pain at PAT appointment   Patient verbalized understanding of instructions that were given to them at the PAT appointment. Patient was also instructed that they will need to review over the PAT instructions again at home before surgery.

## 2020-04-20 NOTE — Patient Instructions (Addendum)
DUE TO COVID-19 ONLY ONE VISITOR IS ALLOWED TO COME WITH YOU AND STAY IN THE WAITING ROOM ONLY DURING PRE OP AND PROCEDURE DAY OF SURGERY. THE 1 VISITOR  MAY VISIT WITH YOU AFTER SURGERY IN YOUR PRIVATE ROOM DURING VISITING HOURS ONLY!  YOU NEED TO HAVE A COVID 19 TEST ON: 04/22/20 @ 03/11/20 , THIS TEST MUST BE DONE BEFORE SURGERY,  COVID TESTING SITE [address] [place] [address], IT IS ON THE RIGHT GOING OUT [address] APPROXIMATELY  2 MINUTES PAST [employer] ON THE RIGHT. ONCE YOUR COVID TEST IS COMPLETED,  PLEASE BEGIN THE QUARANTINE INSTRUCTIONS AS OUTLINED IN YOUR HANDOUT.                JOSILYN SHIPPEE    Your procedure is scheduled on: 04/26/20   Report to [facility] Main  Entrance   Report to short stay at: 5:30 AM     Call this number if you have problems the morning of surgery [phone]    Remember: Do not eat food or drink liquids :After Midnight.   BRUSH YOUR TEETH MORNING OF SURGERY AND RINSE YOUR MOUTH OUT, NO CHEWING GUM CANDY OR MINTS.    Take these medicines the morning of surgery with A SIP OF WATER: aripiprazole,cetirizine,famotidine,pantoprazole.                               You may not have any metal on your body including hair pins and              piercings  Do not wear jewelry, make-up, lotions, powders or perfumes, deodorant             Do not wear nail polish on your fingernails.  Do not shave  48 hours prior to surgery.    Do not bring valuables to the hospital. [facility] IS NOT             RESPONSIBLE   FOR VALUABLES.  Contacts, dentures or bridgework may not be worn into surgery.  Leave suitcase in the car. After surgery it may be brought to your room.     Patients discharged the day of surgery will not be allowed to drive home. IF YOU ARE HAVING SURGERY AND GOING HOME THE SAME DAY, YOU MUST HAVE AN ADULT TO DRIVE YOU HOME AND BE WITH YOU FOR 24 HOURS. YOU MAY GO HOME BY TAXI OR UBER OR ORTHERWISE, BUT AN ADULT  MUST ACCOMPANY YOU HOME AND STAY WITH YOU FOR 24 HOURS.  Name and phone number of your driver:  Special Instructions: N/A              Please read over the following fact sheets you were given: _____________________________________________________________________         [facility] - Preparing for Surgery Before surgery, you can play an important role.  Because skin is not sterile, your skin needs to be as free of germs as possible.  You can reduce the number of germs on your skin by washing with CHG (chlorahexidine gluconate) soap before surgery.  CHG is an antiseptic cleaner which kills germs and bonds with the skin to continue killing germs even after washing. Please DO NOT use if you have an allergy to CHG or antibacterial soaps.  If your skin becomes reddened/irritated stop using the CHG and inform your nurse when you arrive at Short Stay. Do not  shave (including legs and underarms) for at least 48 hours prior to the first CHG shower.  You may shave your face/neck. Please follow these instructions carefully:  1.  Shower with CHG Soap the night before surgery and the  morning of Surgery.  2.  If you choose to wash your hair, wash your hair first as usual with your  normal  shampoo.  3.  After you shampoo, rinse your hair and body thoroughly to remove the  shampoo.                           4.  Use CHG as you would any other liquid soap.  You can apply chg directly  to the skin and wash                       Gently with a scrungie or clean washcloth.  5.  Apply the CHG Soap to your body ONLY FROM THE NECK DOWN.   Do not use on face/ open                           Wound or open sores. Avoid contact with eyes, ears mouth and genitals (private parts).                       Wash face,  Genitals (private parts) with your normal soap.             6.  Wash thoroughly, paying special attention to the area where your surgery  will be performed.  7.  Thoroughly rinse your body with warm water from the  neck down.  8.  DO NOT shower/wash with your normal soap after using and rinsing off  the CHG Soap.                9.  Pat yourself dry with a clean towel.            10.  Wear clean pajamas.            11.  Place clean sheets on your bed the night of your first shower and do not  sleep with pets. Day of Surgery : Do not apply any lotions/deodorants the morning of surgery.  Please wear clean clothes to the hospital/surgery center.  FAILURE TO FOLLOW THESE INSTRUCTIONS MAY RESULT IN THE CANCELLATION OF YOUR SURGERY PATIENT SIGNATURE_________________________________  NURSE SIGNATURE__________________________________  ________________________________________________________________________

## 2020-04-21 ENCOUNTER — Encounter: Payer: Self-pay | Admitting: Family Medicine

## 2020-04-21 ENCOUNTER — Other Ambulatory Visit: Payer: Self-pay | Admitting: Family Medicine

## 2020-04-21 DIAGNOSIS — R5383 Other fatigue: Secondary | ICD-10-CM

## 2020-04-21 NOTE — Progress Notes (Signed)
Sleep study ordered

## 2020-04-22 ENCOUNTER — Other Ambulatory Visit (HOSPITAL_COMMUNITY)
Admission: RE | Admit: 2020-04-22 | Discharge: 2020-04-22 | Disposition: A | Discharge status: Home / Self Care | Payer: Medicaid Other | Source: Ambulatory Visit | Attending: Surgery | Admitting: Surgery

## 2020-04-22 ENCOUNTER — Other Ambulatory Visit: Payer: Self-pay

## 2020-04-22 DIAGNOSIS — Z20822 Contact with and (suspected) exposure to covid-19: Secondary | ICD-10-CM | POA: Diagnosis not present

## 2020-04-22 DIAGNOSIS — R112 Nausea with vomiting, unspecified: Secondary | ICD-10-CM

## 2020-04-22 DIAGNOSIS — Z01812 Encounter for preprocedural laboratory examination: Secondary | ICD-10-CM | POA: Diagnosis not present

## 2020-04-22 LAB — SARS CORONAVIRUS 2 (TAT 6-24 HRS): SARS Coronavirus 2: NEGATIVE

## 2020-04-25 MED ORDER — DEXTROSE 5 % IV SOLN
3.0000 g | INTRAVENOUS | Status: AC
  Administered 2020-04-26: 3 g via INTRAVENOUS
  Filled 2020-04-25: qty 3

## 2020-04-25 NOTE — Anesthesia Preprocedure Evaluation (Addendum)
Anesthesia Evaluation  Patient identified by MRN, date of birth, ID band Patient awake    Reviewed: Allergy & Precautions, H&P , NPO status , Patient's Chart, lab work & pertinent test results  Airway Mallampati: III  TM Distance: >3 FB Neck ROM: Full    Dental no notable dental hx. (+) Teeth Intact, Dental Advisory Given   Pulmonary neg pulmonary ROS,    Pulmonary exam normal breath sounds clear to auscultation       Cardiovascular Exercise Tolerance: Good negative cardio ROS   Rhythm:Regular Rate:Normal     Neuro/Psych Anxiety negative neurological ROS     GI/Hepatic Neg liver ROS, GERD  Medicated,  Endo/Other  Morbid obesity  Renal/GU negative Renal ROS  negative genitourinary   Musculoskeletal   Abdominal   Peds  Hematology negative hematology ROS (+)   Anesthesia Other Findings   Reproductive/Obstetrics negative OB ROS                            Anesthesia Physical Anesthesia Plan  ASA: III  Anesthesia Plan: General   Post-op Pain Management:    Induction: Intravenous  PONV Risk Score and Plan: 4 or greater and Ondansetron, Dexamethasone and Midazolam  Airway Management Planned: Oral ETT and Video Laryngoscope Planned  Additional Equipment:   Intra-op Plan:   Post-operative Plan: Extubation in OR  Informed Consent: I have reviewed the patients History and Physical, chart, labs and discussed the procedure including the risks, benefits and alternatives for the proposed anesthesia with the patient or authorized representative who has indicated his/her understanding and acceptance.     Dental advisory given  Plan Discussed with: CRNA  Anesthesia Plan Comments:        Anesthesia Quick Evaluation

## 2020-04-26 ENCOUNTER — Observation Stay (HOSPITAL_COMMUNITY)
Admission: RE | Admit: 2020-04-26 | Discharge: 2020-04-27 | Disposition: A | Discharge status: Home / Self Care | Payer: Medicaid Other | Attending: Surgery | Admitting: Surgery

## 2020-04-26 ENCOUNTER — Other Ambulatory Visit: Payer: Self-pay

## 2020-04-26 ENCOUNTER — Ambulatory Visit (HOSPITAL_COMMUNITY): Payer: Medicaid Other | Admitting: Physician Assistant

## 2020-04-26 ENCOUNTER — Encounter (HOSPITAL_COMMUNITY): Payer: Self-pay | Admitting: Surgery

## 2020-04-26 ENCOUNTER — Encounter (HOSPITAL_COMMUNITY)
Admission: RE | Disposition: A | Discharge status: Home / Self Care | Payer: Self-pay | Source: Home / Self Care | Attending: Surgery

## 2020-04-26 DIAGNOSIS — K219 Gastro-esophageal reflux disease without esophagitis: Secondary | ICD-10-CM | POA: Diagnosis not present

## 2020-04-26 DIAGNOSIS — L0501 Pilonidal cyst with abscess: Secondary | ICD-10-CM | POA: Diagnosis not present

## 2020-04-26 DIAGNOSIS — L0591 Pilonidal cyst without abscess: Principal | ICD-10-CM | POA: Diagnosis present

## 2020-04-26 DIAGNOSIS — L988 Other specified disorders of the skin and subcutaneous tissue: Secondary | ICD-10-CM | POA: Diagnosis not present

## 2020-04-26 DIAGNOSIS — F419 Anxiety disorder, unspecified: Secondary | ICD-10-CM | POA: Diagnosis not present

## 2020-04-26 HISTORY — PX: INCISION AND DRAINAGE ABSCESS: SHX5864

## 2020-04-26 LAB — CBC
HCT: 39.7 % (ref 36.0–46.0)
Hemoglobin: 13 g/dL (ref 12.0–15.0)
MCH: 28.3 pg (ref 26.0–34.0)
MCHC: 32.7 g/dL (ref 30.0–36.0)
MCV: 86.3 fL (ref 80.0–100.0)
Platelets: 251 10*3/uL (ref 150–400)
RBC: 4.6 MIL/uL (ref 3.87–5.11)
RDW: 13.4 % (ref 11.5–15.5)
WBC: 10 10*3/uL (ref 4.0–10.5)
nRBC: 0 % (ref 0.0–0.2)

## 2020-04-26 LAB — CREATININE, SERUM
Creatinine, Ser: 0.96 mg/dL (ref 0.44–1.00)
GFR, Estimated: >60 mL/min (ref >60)

## 2020-04-26 LAB — PREGNANCY, URINE: Preg Test, Ur: NEGATIVE

## 2020-04-26 SURGERY — INCISION AND DRAINAGE, ABSCESS
Anesthesia: General

## 2020-04-26 MED ORDER — HYDROMORPHONE HCL 1 MG/ML IJ SOLN
0.2500 mg | INTRAMUSCULAR | Status: DC | PRN
Start: 2020-04-26 — End: 2020-04-26

## 2020-04-26 MED ORDER — ONDANSETRON HCL 4 MG/2ML IJ SOLN
INTRAMUSCULAR | Status: DC | PRN
  Administered 2020-04-26: 4 mg via INTRAVENOUS

## 2020-04-26 MED ORDER — ARIPIPRAZOLE 5 MG PO TABS
5.0000 mg | ORAL_TABLET | Freq: Every day | ORAL | Status: DC
  Administered 2020-04-27: 5 mg via ORAL
  Filled 2020-04-26 (×3): qty 1

## 2020-04-26 MED ORDER — BUPIVACAINE-EPINEPHRINE 0.25% -1:200000 IJ SOLN
INTRAMUSCULAR | Status: DC | PRN
  Administered 2020-04-26: 30 mL

## 2020-04-26 MED ORDER — CHLORHEXIDINE GLUCONATE 0.12 % MT SOLN
15.0000 mL | Freq: Once | OROMUCOSAL | Status: AC
  Administered 2020-04-26: 15 mL via OROMUCOSAL

## 2020-04-26 MED ORDER — CHLORHEXIDINE GLUCONATE CLOTH 2 % EX PADS
6.0000 | MEDICATED_PAD | Freq: Once | CUTANEOUS | Status: DC
  Administered 2020-04-26: 6 via TOPICAL

## 2020-04-26 MED ORDER — OXYCODONE HCL 5 MG PO TABS
5.0000 mg | ORAL_TABLET | ORAL | Status: DC | PRN
Start: 2020-04-26 — End: 2020-04-27
  Administered 2020-04-27 (×2): 5 mg via ORAL
  Filled 2020-04-26 (×2): qty 1

## 2020-04-26 MED ORDER — 0.9 % SODIUM CHLORIDE (POUR BTL) OPTIME
TOPICAL | Status: DC | PRN
  Administered 2020-04-26: 1000 mL

## 2020-04-26 MED ORDER — HYDROMORPHONE HCL 1 MG/ML IJ SOLN: 1.0000 mg | INTRAMUSCULAR | Status: DC | PRN

## 2020-04-26 MED ORDER — LACTATED RINGERS IV SOLN: INTRAVENOUS | Status: DC

## 2020-04-26 MED ORDER — SODIUM CHLORIDE 0.45 % IV SOLN: INTRAVENOUS | Status: DC

## 2020-04-26 MED ORDER — ACETAMINOPHEN 650 MG RE SUPP: 650.0000 mg | Freq: Four times a day (QID) | RECTAL | Status: DC | PRN

## 2020-04-26 MED ORDER — SUCCINYLCHOLINE CHLORIDE 200 MG/10ML IV SOSY
PREFILLED_SYRINGE | INTRAVENOUS | Status: DC | PRN
  Administered 2020-04-26: 100 mg via INTRAVENOUS

## 2020-04-26 MED ORDER — ENOXAPARIN SODIUM 40 MG/0.4ML ~~LOC~~ SOLN: 40.0000 mg | SUBCUTANEOUS | Status: DC

## 2020-04-26 MED ORDER — ALBUTEROL SULFATE (2.5 MG/3ML) 0.083% IN NEBU: 3.0000 mL | INHALATION_SOLUTION | Freq: Four times a day (QID) | RESPIRATORY_TRACT | Status: DC | PRN

## 2020-04-26 MED ORDER — BUPIVACAINE-EPINEPHRINE (PF) 0.25% -1:200000 IJ SOLN
INTRAMUSCULAR | Status: AC
  Filled 2020-04-26: qty 30

## 2020-04-26 MED ORDER — PANTOPRAZOLE SODIUM 40 MG PO TBEC
40.0000 mg | DELAYED_RELEASE_TABLET | Freq: Two times a day (BID) | ORAL | Status: DC
  Administered 2020-04-26 – 2020-04-27 (×3): 40 mg via ORAL
  Filled 2020-04-26 (×3): qty 1

## 2020-04-26 MED ORDER — SUCCINYLCHOLINE CHLORIDE 200 MG/10ML IV SOSY
PREFILLED_SYRINGE | INTRAVENOUS | Status: AC
  Filled 2020-04-26: qty 30

## 2020-04-26 MED ORDER — PROPOFOL 10 MG/ML IV BOLUS
INTRAVENOUS | Status: DC | PRN
  Administered 2020-04-26: 200 mg via INTRAVENOUS

## 2020-04-26 MED ORDER — LIDOCAINE 2% (20 MG/ML) 5 ML SYRINGE
INTRAMUSCULAR | Status: DC | PRN
  Administered 2020-04-26: 80 mg via INTRAVENOUS

## 2020-04-26 MED ORDER — FENTANYL CITRATE (PF) 250 MCG/5ML IJ SOLN
INTRAMUSCULAR | Status: DC | PRN
  Administered 2020-04-26: 100 ug via INTRAVENOUS
  Administered 2020-04-26: 50 ug via INTRAVENOUS
  Administered 2020-04-26: 100 ug via INTRAVENOUS

## 2020-04-26 MED ORDER — DEXAMETHASONE SODIUM PHOSPHATE 10 MG/ML IJ SOLN
INTRAMUSCULAR | Status: DC | PRN
  Administered 2020-04-26: 5 mg via INTRAVENOUS

## 2020-04-26 MED ORDER — BUPIVACAINE LIPOSOME 1.3 % IJ SUSP
20.0000 mL | Freq: Once | INTRAMUSCULAR | Status: AC
  Administered 2020-04-26: 20 mL
  Filled 2020-04-26: qty 20

## 2020-04-26 MED ORDER — MIDAZOLAM HCL 2 MG/2ML IJ SOLN
INTRAMUSCULAR | Status: AC
  Filled 2020-04-26: qty 2

## 2020-04-26 MED ORDER — TRAMADOL HCL 50 MG PO TABS: 50.0000 mg | ORAL_TABLET | Freq: Four times a day (QID) | ORAL | Status: DC | PRN

## 2020-04-26 MED ORDER — MIDAZOLAM HCL 5 MG/5ML IJ SOLN
INTRAMUSCULAR | Status: DC | PRN
  Administered 2020-04-26: 2 mg via INTRAVENOUS

## 2020-04-26 MED ORDER — CALCIUM CARBONATE ANTACID 500 MG PO CHEW: 1.0000 | CHEWABLE_TABLET | Freq: Every day | ORAL | Status: DC | PRN

## 2020-04-26 MED ORDER — ONDANSETRON HCL 4 MG/2ML IJ SOLN: 4.0000 mg | Freq: Four times a day (QID) | INTRAMUSCULAR | Status: DC | PRN

## 2020-04-26 MED ORDER — PROPOFOL 10 MG/ML IV BOLUS
INTRAVENOUS | Status: AC
  Filled 2020-04-26: qty 40

## 2020-04-26 MED ORDER — ONDANSETRON 4 MG PO TBDP: 4.0000 mg | ORAL_TABLET | Freq: Four times a day (QID) | ORAL | Status: DC | PRN

## 2020-04-26 MED ORDER — FENTANYL CITRATE (PF) 250 MCG/5ML IJ SOLN
INTRAMUSCULAR | Status: AC
  Filled 2020-04-26: qty 5

## 2020-04-26 MED ORDER — ACETAMINOPHEN 325 MG PO TABS
650.0000 mg | ORAL_TABLET | Freq: Four times a day (QID) | ORAL | Status: DC | PRN
  Administered 2020-04-27: 650 mg via ORAL
  Filled 2020-04-26: qty 2

## 2020-04-26 MED ORDER — CEFAZOLIN SODIUM-DEXTROSE 2-4 GM/100ML-% IV SOLN
INTRAVENOUS | Status: AC
  Filled 2020-04-26: qty 100

## 2020-04-26 MED ORDER — ACETAMINOPHEN 500 MG PO TABS
1000.0000 mg | ORAL_TABLET | Freq: Once | ORAL | Status: AC
  Administered 2020-04-26: 1000 mg via ORAL
  Filled 2020-04-26: qty 2

## 2020-04-26 MED ORDER — ORAL CARE MOUTH RINSE: 15.0000 mL | Freq: Once | OROMUCOSAL | Status: AC

## 2020-04-26 SURGICAL SUPPLY — 28 items
BLADE SURG 15 STRL LF DISP TIS (BLADE) ×1 IMPLANT
BLADE SURG 15 STRL SS (BLADE) ×1
BNDG GAUZE ELAST 4 BULKY (GAUZE/BANDAGES/DRESSINGS) IMPLANT
CHLORAPREP W/TINT 26 (MISCELLANEOUS) ×2 IMPLANT
COVER WAND RF STERILE (DRAPES) IMPLANT
DECANTER SPIKE VIAL GLASS SM (MISCELLANEOUS) IMPLANT
DRAPE LAPAROSCOPIC ABDOMINAL (DRAPES) IMPLANT
DRSG PAD ABDOMINAL 8X10 ST (GAUZE/BANDAGES/DRESSINGS) IMPLANT
ELECT REM PT RETURN 15FT ADLT (MISCELLANEOUS) ×2 IMPLANT
GAUZE PACKING IODOFORM 1X5 (PACKING) ×2 IMPLANT
GAUZE SPONGE 4X4 12PLY STRL (GAUZE/BANDAGES/DRESSINGS) IMPLANT
GLOVE SURG ENC MOIS LTX SZ7.5 (GLOVE) ×2 IMPLANT
GOWN STRL REUS W/TWL LRG LVL3 (GOWN DISPOSABLE) ×4 IMPLANT
KIT BASIN OR (CUSTOM PROCEDURE TRAY) ×2 IMPLANT
KIT TURNOVER KIT A (KITS) ×2 IMPLANT
NEEDLE HYPO 25X1 1.5 SAFETY (NEEDLE) IMPLANT
NS IRRIG 1000ML POUR BTL (IV SOLUTION) ×2 IMPLANT
PENCIL SMOKE EVACUATOR (MISCELLANEOUS) IMPLANT
SPONGE LAP 18X18 RF (DISPOSABLE) ×2 IMPLANT
SUT MNCRL AB 4-0 PS2 18 (SUTURE) IMPLANT
SUT VIC AB 3-0 SH 27 (SUTURE)
SUT VIC AB 3-0 SH 27XBRD (SUTURE) IMPLANT
SWAB COLLECTION DEVICE MRSA (MISCELLANEOUS) IMPLANT
SWAB CULTURE ESWAB REG 1ML (MISCELLANEOUS) IMPLANT
SYR BULB EAR ULCER 3OZ GRN STR (SYRINGE) ×2 IMPLANT
SYR CONTROL 10ML LL (SYRINGE) IMPLANT
TOWEL OR 17X26 10 PK STRL BLUE (TOWEL DISPOSABLE) ×2 IMPLANT
YANKAUER SUCT BULB TIP NO VENT (SUCTIONS) ×2 IMPLANT

## 2020-04-26 NOTE — Transfer of Care (Signed)
Immediate Anesthesia Transfer of Care Note  Patient: Katelyn Aguilar  Procedure(s) Performed: excision of pilonidal cyst with open packing (N/A )  Patient Location: PACU  Anesthesia Type:General  Level of Consciousness: awake, alert , oriented and patient cooperative  Airway & Oxygen Therapy: Patient Spontanous Breathing and Patient connected to face mask oxygen  Post-op Assessment: Report given to RN and Post -op Vital signs reviewed and stable  Post vital signs: Reviewed and stable  Last Vitals:  Vitals Value Taken Time  BP    Temp    Pulse    Resp    SpO2      Last Pain:  Vitals:   04/26/20 0547  TempSrc: Oral  PainSc: 0-No pain         Complications: No complications documented.

## 2020-04-26 NOTE — Interval H&P Note (Signed)
History and Physical Interval Note:  04/26/2020 7:11 AM  Katelyn Aguilar  has presented today for surgery, with the diagnosis of PILONIDAL CYST.  The various methods of treatment have been discussed with the patient and family. After consideration of risks, benefits and other options for treatment, the patient has consented to    Procedure(s): excision of pilonidal cyst with open packing (N/A) as a surgical intervention.    The patient's history has been reviewed, patient examined, no change in status, stable for surgery.  I have reviewed the patient's chart and labs.  Questions were answered to the patient's satisfaction.    Darnell Level, MD [facility] Office: [phone]   Darnell Level

## 2020-04-26 NOTE — Anesthesia Postprocedure Evaluation (Signed)
Anesthesia Post Note  Patient: Katelyn Aguilar  Procedure(s) Performed: excision of pilonidal cyst with open packing (N/A )     Patient location during evaluation: PACU Anesthesia Type: General Level of consciousness: awake and alert Pain management: pain level controlled Vital Signs Assessment: post-procedure vital signs reviewed and stable Respiratory status: spontaneous breathing, nonlabored ventilation, respiratory function stable and patient connected to nasal cannula oxygen Cardiovascular status: blood pressure returned to baseline and stable Postop Assessment: no apparent nausea or vomiting Anesthetic complications: no   No complications documented.  Last Vitals:  Vitals:   04/26/20 0830 04/26/20 0845  BP: 118/73 119/76  Pulse: (!) 102 96  Resp: 12 17  Temp:    SpO2: 99% 97%    Last Pain:  Vitals:   04/26/20 0845  TempSrc:   PainSc: 0-No pain                 Kriston Mckinnie,W. EDMOND

## 2020-04-26 NOTE — Op Note (Signed)
Operative Note  Pre-operative Diagnosis:  Pilonidal cyst  Post-operative Diagnosis:  same  Surgeon:  Darnell Level, MD  Assistant:  none   Procedure:  Excision of pilonidal cyst with open packing  Anesthesia:  general  Estimated Blood Loss:  minimal  Drains: none         Specimen: to pathology  Indications:  Patient is referred by Dr. Dana Allan and Dr. Doralee Albino for surgical evaluation and management of pilonidal disease. Patient first developed symptoms a few months ago. She underwent incision and drainage of an abscess from the natal cleft at an urgent care. She had recurrence of drainage and required a repeat procedure at the family practice center. Wound has not healed and she has had continued drainage. Diagnosis of pilonidal disease was made and the patient has been referred to surgery for further evaluation and management. Patient has never had a definitive excision.   Procedure:  The patient was seen in the pre-op holding area. The risks, benefits, complications, treatment options, and expected outcomes were previously discussed with the patient. The patient agreed with the proposed plan and has signed the informed consent form.  The patient was brought to the operating room by the surgical team, identified as Katelyn Aguilar and the procedure verified. A "time out" was completed and the above information confirmed.  Following administration of general anesthesia, the patient is turned to a prone position on the operating room table.  The natal cleft is taped open in the usual fashion.  Skin is prepped with Betadine.  There is a large opening in the upper natal cleft extending into the subcutaneous tissues containing hair and other debris.  There is another small sinus tract approximately 2 cm cephalad.  Using the electrocautery an elliptical incision is made sized to encompass both openings in the skin.  Dissection is then carried into the subcutaneous tissues.  Using  the electrocautery the pilonidal cyst is excised with a margin of normal adipose tissue surrounding it.  The entire specimen measures approximately 5 x 3 x 5 cm in dimension.  It is submitted to pathology for review.  Wound is irrigated with warm saline.  Good hemostasis is achieved with the electrocautery.  Local anesthetic with Exparel and Marcaine mixed is injected into the subcutaneous tissues and into the base of the wound.  Wound is then packed with 1 inch iodoform gauze packing.  This is covered with dry gauze dressing and an ABD pad.  Patient is turned back to a supine position and awakened from anesthesia.  She is transported to the recovery room in stable condition.  The patient tolerated the procedure well.   Darnell Level, MD [facility] Office: [phone]

## 2020-04-26 NOTE — Anesthesia Procedure Notes (Signed)
Procedure Name: Intubation Date/Time: 04/26/2020 7:25 AM Performed by: Elyn Peers, CRNA Pre-anesthesia Checklist: Patient identified, Emergency Drugs available, Suction available, Patient being monitored and Timeout performed Patient Re-evaluated:Patient Re-evaluated prior to induction Oxygen Delivery Method: Circle system utilized Preoxygenation: Pre-oxygenation with 100% oxygen (ET O2 80%) Induction Type: IV induction Laryngoscope Size: Miller and 3 Grade View: Grade I Tube type: Oral Tube size: 7.0 mm Number of attempts: 1 Airway Equipment and Method: Stylet and Patient positioned with wedge pillow Placement Confirmation: ETT inserted through vocal cords under direct vision,  positive ETCO2 and breath sounds checked- equal and bilateral Secured at: 22 cm Tube secured with: Tape Dental Injury: Teeth and Oropharynx as per pre-operative assessment

## 2020-04-27 ENCOUNTER — Encounter (HOSPITAL_COMMUNITY): Payer: Self-pay | Admitting: Surgery

## 2020-04-27 DIAGNOSIS — L0591 Pilonidal cyst without abscess: Secondary | ICD-10-CM | POA: Diagnosis not present

## 2020-04-27 DIAGNOSIS — L988 Other specified disorders of the skin and subcutaneous tissue: Secondary | ICD-10-CM | POA: Diagnosis not present

## 2020-04-27 LAB — SURGICAL PATHOLOGY

## 2020-04-27 MED ORDER — TRAMADOL HCL 50 MG PO TABS
50.0000 mg | ORAL_TABLET | Freq: Four times a day (QID) | ORAL | 0 refills | Status: DC | PRN
Start: 2020-04-27 — End: 2020-06-06

## 2020-04-27 NOTE — Progress Notes (Signed)
Discharge instructions discussed with patient and family, including wound care, husband able to change dressing, verbalized agreement and understanding

## 2020-04-27 NOTE — Discharge Summary (Signed)
Physician Discharge Summary [facility]  Patient ID: Katelyn Aguilar MRN: [id number] DOB/AGE: 02/11/2000 20 y.o.  Admit date: 04/26/2020  Discharge date: 04/27/2020  Discharge Diagnoses:  Principal Problem:   Pilonidal cyst without abscess Active Problems:   Pilonidal cyst of natal cleft   Discharged Condition: good  Hospital Course: Patient was admitted for observation following pilonidal cyst surgery.  Post op course was uncomplicated.  Pain was well controlled.  Tolerated diet.  First dressing change performed prior to discharge with instruction to family.  Patient was prepared for discharge home on POD#1.  Consults: None  Treatments: surgery: excision of pilonidal cyst with open packing  Discharge Exam: Blood pressure (!) 160/93, pulse (!) 107, temperature 98.1 F (36.7 C), temperature source Oral, resp. rate 18, height 5\' 3"  (1.6 m), weight (!) 154.7 kg, SpO2 99 %. HEENT - clear Neck - soft Chest - clear bilaterally Cor - RRR GU - dressings dry and intact  Disposition: Home  Discharge Instructions    Diet - low sodium heart healthy   Complete by: As directed    Discharge instructions   Complete by: As directed    [facility] [facility] -- DISCHARGE INSTRUCTIONS  REMINDER:   Carry a list of your medications and allergies with you at all times  Call your pharmacy at least 1 week in advance to refill prescriptions  Do not mix any prescribed pain medicine with alcohol  Do not drive any motor vehicles while taking pain medication  Take medications with food unless otherwise directed  Follow-up appointments (date to return to physician): Please call [phone] to confirm your follow up appointment with your surgeon.  Call your Surgeon if you have:  Temperature greater than 101.0  Persistent nausea and vomiting  Severe uncontrolled pain  Redness, tenderness, or signs of infection (pain, swelling, redness, odor or green/yellow discharge  around the site)  Difficulty breathing, headache or visual disturbances  Hives  Persistent dizziness or light-headedness  Any other questions or concerns you may have after discharge  In an emergency, call 911 or go to an Emergency Department at a nearby hospital.   Diet: Begin with liquids, and if they are tolerated, resume your usual diet.  Avoid spicy, greasy or heavy foods.  If you have nausea or vomiting, go back to liquids.  If you cannot keep liquids down, call your doctor.  Avoid alcohol consumption while on prescription pain medications. Good nutrition promotes healing. Increase fiber and fluids.   ADDITIONAL INSTRUCTIONS: Dressing changes twice daily as instructed.  Shower one time a day with dressing change.  [facility] Office: [phone]   [facility] [place] [facility] -- DISCHARGE INSTRUCTIONS  REMINDER:   Carry a list of your medications and allergies with you at all times  Call your pharmacy at least 1 week in advance to refill prescriptions  Do not mix any prescribed pain medicine with alcohol  Do not drive any motor vehicles while taking pain medication  Take medications with food unless otherwise directed  Follow-up appointments (date to return to physician): Please call [phone] to confirm your follow up appointment with your surgeon.  Call your Surgeon if you have:  Temperature greater than 101.0  Persistent nausea and vomiting  Severe uncontrolled pain  Redness, tenderness, or signs of infection (pain, swelling, redness, odor or green/yellow discharge around the site)  Difficulty breathing, headache or visual disturbances  Hives  Persistent dizziness or light-headedness  Any other questions or concerns you  may have after discharge  In an emergency, call 911 or go to an Emergency Department at a nearby hospital.   Diet: Begin with liquids, and if they are tolerated, resume your usual diet.  Avoid spicy, greasy or heavy foods.  If you  have nausea or vomiting, go back to liquids.  If you cannot keep liquids down, call your doctor.  Avoid alcohol consumption while on prescription pain medications. Good nutrition promotes healing. Increase fiber and fluids.   ADDITIONAL INSTRUCTIONS: Dressing change twice daily as instructed.  Shower once a day with dressing change.  [facility] Office: [phone]   Discharge wound care:   Complete by: As directed    As instructed.   Increase activity slowly   Complete by: As directed      Allergies as of 04/27/2020   No Known Allergies     Medication List    TAKE these medications   acetaminophen 325 MG tablet Commonly known as: TYLENOL Take 650 mg by mouth every 6 (six) hours as needed for moderate pain.   albuterol 108 (90 Base) MCG/ACT inhaler Commonly known as: VENTOLIN HFA Inhale 1-2 puffs into the lungs every 6 (six) hours as needed for wheezing or shortness of breath.   ARIPiprazole 5 MG tablet Commonly known as: Abilify Take 1 tablet (5 mg total) by mouth daily.   calcium carbonate 500 MG chewable tablet Commonly known as: TUMS - dosed in mg elemental calcium Chew 1 tablet by mouth daily as needed for indigestion or heartburn.   cetirizine 10 MG tablet Commonly known as: ZYRTEC Take 1 tablet (10 mg total) by mouth daily.   famotidine 20 MG tablet Commonly known as: PEPCID Take 1 tablet (20 mg total) by mouth 2 (two) times daily.   multivitamin with minerals Tabs tablet Take 1 tablet by mouth daily.   pantoprazole 40 MG tablet Commonly known as: PROTONIX Take 1 tablet (40 mg total) by mouth 2 (two) times daily.   traMADol 50 MG tablet Commonly known as: ULTRAM Take 1-2 tablets (50-100 mg total) by mouth every 6 (six) hours as needed for moderate pain.            Discharge Care Instructions  (From admission, onward)         Start     Ordered   04/27/20 0000  Discharge wound care:       Comments: As instructed.   04/27/20  0981          Follow-up Information    Darnell Level, MD. Schedule an appointment as soon as possible for a visit in 1 day(s).   Specialty: General Surgery Contact information: [address] [place] [address] [phone]               Darnell Level, MD [facility] Office: [phone]   Signed: Darnell Level 04/27/2020, 8:12 AM

## 2020-04-28 ENCOUNTER — Telehealth: Payer: Self-pay | Admitting: *Deleted

## 2020-04-28 NOTE — Telephone Encounter (Signed)
Transition Care Management Unsuccessful Follow-up Telephone Call  Date of discharge and from where:  04/27/2020 - [facility]  Attempts:  1st Attempt  Reason for unsuccessful TCM follow-up call:  Left voice message

## 2020-04-29 DIAGNOSIS — Z419 Encounter for procedure for purposes other than remedying health state, unspecified: Secondary | ICD-10-CM | POA: Diagnosis not present

## 2020-04-29 NOTE — Telephone Encounter (Signed)
Transition Care Management Follow-up Telephone Call  Date of discharge and from where: 04/27/2020 from [facility]  How have you been since you were released from the hospital? Pt stated that she is feeling better and did not have any questions or concerns.   Any questions or concerns? No  Items Reviewed:  Did the pt receive and understand the discharge instructions provided? Yes   Medications obtained and verified? Yes   Other? No   Any new allergies since your discharge? No   Dietary orders reviewed? n/a  Do you have support at home? Yes   Functional Questionnaire: (I = Independent and D = Dependent) ADLs: I  Bathing/Dressing- I  Meal Prep- I  Eating- I  Maintaining continence- I  Transferring/Ambulation- I  Managing Meds- I  Follow up appointments reviewed:   Specialist Hospital f/u appt confirmed? No  Pt stated that she would call for surgical follow up.   Are transportation arrangements needed? No   If their condition worsens, is the pt aware to call PCP or go to the Emergency Dept.? Yes  Was the patient provided with contact information for the PCP's office or ED? Yes  Was to pt encouraged to call back with questions or concerns? Yes

## 2020-05-03 ENCOUNTER — Other Ambulatory Visit: Payer: Self-pay | Admitting: Neurology

## 2020-05-03 ENCOUNTER — Encounter: Payer: Self-pay | Admitting: Family Medicine

## 2020-05-03 MED ORDER — ARIPIPRAZOLE 5 MG PO TABS: 5.0000 mg | ORAL_TABLET | Freq: Every day | ORAL | 0 refills | Status: DC

## 2020-05-03 NOTE — Telephone Encounter (Signed)
Dr. Zannie Cove last note instructed her to continue care with her PCP for her anxiety. The patient has not spoken with her PCP yet and is asking for a supply of Ability 5mg  be sent to the pharmacy. 90-day sent to avoid disruption to her treatment. She will follow up with primary care prior to her next refill being due.

## 2020-05-06 ENCOUNTER — Encounter: Payer: Self-pay | Admitting: Family Medicine

## 2020-05-06 DIAGNOSIS — F411 Generalized anxiety disorder: Secondary | ICD-10-CM | POA: Diagnosis not present

## 2020-05-09 ENCOUNTER — Telehealth (INDEPENDENT_AMBULATORY_CARE_PROVIDER_SITE_OTHER): Payer: Medicaid Other | Admitting: Family Medicine

## 2020-05-09 ENCOUNTER — Encounter: Payer: Self-pay | Admitting: Family Medicine

## 2020-05-09 DIAGNOSIS — F418 Other specified anxiety disorders: Secondary | ICD-10-CM | POA: Diagnosis not present

## 2020-05-09 MED ORDER — HYDROXYZINE HCL 10 MG PO TABS: 5.0000 mg | ORAL_TABLET | Freq: Every evening | ORAL | 0 refills | Status: DC

## 2020-05-09 NOTE — Progress Notes (Signed)
[facility] Telemedicine Visit  Patient consented to have virtual visit and was identified by name and date of birth. Method of visit: Video  Encounter participants: Patient: Katelyn Aguilar - located at home Provider: Dana Allan - located at home office Others (if applicable): mother  Chief Complaint: anxiety  HPI:  Requesting medications to help with anxiety. Reports feeling anxious all the time and sometimes panics. Constantly shakes legs while sitting.  Reports having difficulty being in large crowds unless having someone with her she knows.  She reports having these feelings since early teens. She is currently seeing a therapist and is reports techniques for relief is not effective.  She had been started on Zoloft in 2021 but was discontinued as she kept forgetting to take it. She is currently on Abilify for simple tics that was initially prescribed by her neurologist and symptoms are controlled.  Denies any SI/HI.    ROS: per HPI  Pertinent PMHx:  Anxiety Simple Tics  Exam:  There were no vitals taken for this visit.  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 Currently on Abilify 5mg  daily for simple tics.  Followed by therapist for anxiety techniques. -Start  Atarax 5mg  qhs, can increase to 10mg  after 1 week -Continue scheduled therapy sessions -Will obtain MDQ at next visit -May benefit from psychiatric evaluation -Consider adding Lexapro at next visit -Follow up in 1 week    Time spent during visit with patient: 15 minutes

## 2020-05-09 NOTE — Assessment & Plan Note (Signed)
GAD 20 and PHQ9 20. Currently on Abilify 5mg  daily for simple tics.  Followed by therapist for anxiety techniques. -Start  Atarax 5mg  qhs, can increase to 10mg  after 1 week -Continue scheduled therapy sessions -Will obtain MDQ at next visit -May benefit from psychiatric evaluation -Consider adding Lexapro at next visit -Follow up in 1 week

## 2020-05-09 NOTE — Telephone Encounter (Signed)
She can schedule a nurses appointment for dep injection and will need schedule an office visit to discuss her anxiety.  Thank you

## 2020-05-11 ENCOUNTER — Ambulatory Visit: Payer: Medicaid Other

## 2020-05-17 ENCOUNTER — Other Ambulatory Visit: Payer: Self-pay

## 2020-05-17 ENCOUNTER — Ambulatory Visit (INDEPENDENT_AMBULATORY_CARE_PROVIDER_SITE_OTHER): Payer: Medicaid Other | Admitting: Family Medicine

## 2020-05-17 VITALS — BP 108/80 | HR 112 | Wt 342.6 lb

## 2020-05-17 DIAGNOSIS — R000 Tachycardia, unspecified: Secondary | ICD-10-CM | POA: Diagnosis not present

## 2020-05-17 DIAGNOSIS — F418 Other specified anxiety disorders: Secondary | ICD-10-CM | POA: Diagnosis not present

## 2020-05-17 DIAGNOSIS — Z3042 Encounter for surveillance of injectable contraceptive: Secondary | ICD-10-CM

## 2020-05-17 DIAGNOSIS — Z6841 Body Mass Index (BMI) 40.0 and over, adult: Secondary | ICD-10-CM

## 2020-05-17 MED ORDER — MEDROXYPROGESTERONE ACETATE 150 MG/ML IM SUSP
150.0000 mg | Freq: Once | INTRAMUSCULAR | Status: AC
  Administered 2020-05-17: 150 mg via INTRAMUSCULAR

## 2020-05-17 MED ORDER — HYDROXYZINE HCL 10 MG PO TABS: 10.0000 mg | ORAL_TABLET | Freq: Two times a day (BID) | ORAL | 0 refills | Status: DC | PRN

## 2020-05-17 NOTE — Patient Instructions (Signed)
Thank you for coming to see me today. It was a pleasure.   Increase Atarax to 10 mg twice a day as needed. You can increase to 3 times a day after 1 week if needed.  Please reach out to find a psychiatrist and make and appointment as soon as you are able to  Please follow-up with me in 2 weeks  If you have any questions or concerns, please do not hesitate to call the office at [phone].  Best,   Dana Allan, MD

## 2020-05-17 NOTE — Progress Notes (Signed)
    SUBJECTIVE:   CHIEF COMPLAINT / HPI: follow up anxiety  Presents for follow up for anxiety. Seen by video visit on 04/11 and started on Atarax 5 mg nightly.  She is also taking Abilify for simple tics that she reports are somewhat controlled.  Since then patient reports slight improvement in symptoms mostly at home. She would like to increase the dose if possible. Associated symptoms include knee shaking, difficulty being in large crowds and occasional panic attacks. She is currently seeing a therapist regularly. Denies any SI/HI and has no active plan.  Has a bearded dragon for emotional support.  Has never been evaluated by psychiatrist.  PERTINENT  PMH / PSH:  Anxiety Simple Tics Elevated BMI  OBJECTIVE:   BP 108/80   Pulse (!) 112   Wt (!) 342 lb 9.6 oz (155.4 kg)   SpO2 98%   BMI 60.69 kg/m    General: Alert, no acute distress Cardio: Normal S1 and S2, RRR, no r/m/g Pulm: CTAB, normal work of breathing Psych: mood good, engaged in conversation, good eye contact, speech appropriate.  Alternating leg restlessness   ASSESSMENT/PLAN:   Anxiety with depression GAD (14) and PHQ 9 (6) improved from last visit, (20/20).  MDQ screening negative.  Stmptoms improving with low dose Atarax.  No SI/HI -Will increase Atarax to 10 mg qhs, can increase to BID after 1 week, then TID as needed. -Continue to follow with therapist -Ref Psychiatry for evaluation  -Consider starting Lexapro at next visit. -Mental health and Crisis line resources provided  -Follow up appointment scheduled 05/09 -Strict return precautions provided  Tachycardia Remains tachycardic but asymptomatic.  Likely multifactoria given anxiety state, elevated BMI. -Continue to monitor -Follow up at next visit  BMI 60.0-69.9, adult (HCC) BMI 60.69, asymptomatic tachycardia, TSH wnl, A1c wnl -Will discuss weight loss options with patient at next visit -Consider injectable Semiglutatide but patient may not be able  to tolerate this with history of GI issues. -May be candidate for Bariatric surgery  Contraception management Depo injection administered by CMA today.  Patient within window, no UPT required. Follow up 3/12     Dana Allan, MD [facility]

## 2020-05-17 NOTE — Progress Notes (Signed)
Patient here today for Depo Provera injection and is within her dates.    Last contraceptive appt was 02/25/2020  Depo given in RUOQ today.  Site unremarkable & patient tolerated injection.    Next injection due 08/02/2020-08/16/2020.  Reminder card given.    Sunday Spillers, CMA

## 2020-05-18 ENCOUNTER — Ambulatory Visit (HOSPITAL_COMMUNITY)
Admission: RE | Admit: 2020-05-18 | Discharge: 2020-05-18 | Disposition: A | Discharge status: Home / Self Care | Payer: Medicaid Other | Source: Ambulatory Visit | Attending: Gastroenterology | Admitting: Gastroenterology

## 2020-05-18 DIAGNOSIS — K3184 Gastroparesis: Secondary | ICD-10-CM | POA: Diagnosis not present

## 2020-05-18 DIAGNOSIS — R112 Nausea with vomiting, unspecified: Secondary | ICD-10-CM | POA: Diagnosis not present

## 2020-05-18 DIAGNOSIS — K30 Functional dyspepsia: Secondary | ICD-10-CM | POA: Diagnosis not present

## 2020-05-18 MED ORDER — TECHNETIUM TC 99M SULFUR COLLOID
2.0000 | Freq: Once | INTRAVENOUS | Status: AC | PRN
  Administered 2020-05-18: 2 via INTRAVENOUS

## 2020-05-19 ENCOUNTER — Encounter: Payer: Self-pay | Admitting: Family Medicine

## 2020-05-19 DIAGNOSIS — Z6841 Body Mass Index (BMI) 40.0 and over, adult: Secondary | ICD-10-CM | POA: Insufficient documentation

## 2020-05-19 DIAGNOSIS — F411 Generalized anxiety disorder: Secondary | ICD-10-CM | POA: Diagnosis not present

## 2020-05-19 DIAGNOSIS — Z309 Encounter for contraceptive management, unspecified: Secondary | ICD-10-CM | POA: Insufficient documentation

## 2020-05-19 NOTE — Assessment & Plan Note (Signed)
Depo injection administered by CMA today.  Patient within window, no UPT required. Follow up 3/12

## 2020-05-19 NOTE — Assessment & Plan Note (Signed)
BMI 60.69, asymptomatic tachycardia, TSH wnl, A1c wnl -Will discuss weight loss options with patient at next visit -Consider injectable Semiglutatide but patient may not be able to tolerate this with history of GI issues. -May be candidate for Bariatric surgery

## 2020-05-19 NOTE — Assessment & Plan Note (Signed)
GAD (14) and PHQ 9 (6) improved from last visit, (20/20).  MDQ screening negative.  Stmptoms improving with low dose Atarax.  No SI/HI -Will increase Atarax to 10 mg qhs, can increase to BID after 1 week, then TID as needed. -Continue to follow with therapist -Ref Psychiatry for evaluation  -Consider starting Lexapro at next visit. -Mental health and Crisis line resources provided  -Follow up appointment scheduled 05/09 -Strict return precautions provided

## 2020-05-19 NOTE — Assessment & Plan Note (Signed)
Remains tachycardic but asymptomatic.  Likely multifactoria given anxiety state, elevated BMI. -Continue to monitor -Follow up at next visit

## 2020-05-23 ENCOUNTER — Encounter: Payer: Self-pay | Admitting: Family Medicine

## 2020-05-23 NOTE — Telephone Encounter (Signed)
She should call her neurologist to be evaluated. I'd be happy to see her though she has been followed by neurology for some time for this and can better assess.  Thanks Kenney Houseman

## 2020-05-25 ENCOUNTER — Telehealth: Payer: Self-pay | Admitting: Family Medicine

## 2020-05-25 NOTE — Telephone Encounter (Signed)
The [facility] center is calling to inform us that the patients insurance denied the sleep study  requested in March.   She will be sending the denial by fax.

## 2020-05-29 DIAGNOSIS — Z419 Encounter for procedure for purposes other than remedying health state, unspecified: Secondary | ICD-10-CM | POA: Diagnosis not present

## 2020-06-01 DIAGNOSIS — F411 Generalized anxiety disorder: Secondary | ICD-10-CM | POA: Diagnosis not present

## 2020-06-06 ENCOUNTER — Encounter: Payer: Self-pay | Admitting: Family Medicine

## 2020-06-06 ENCOUNTER — Ambulatory Visit (HOSPITAL_COMMUNITY)
Admission: RE | Admit: 2020-06-06 | Discharge: 2020-06-06 | Disposition: A | Discharge status: Home / Self Care | Payer: Medicaid Other | Source: Ambulatory Visit | Attending: Family Medicine | Admitting: Family Medicine

## 2020-06-06 ENCOUNTER — Ambulatory Visit: Payer: Medicaid Other | Admitting: Neurology

## 2020-06-06 ENCOUNTER — Ambulatory Visit (INDEPENDENT_AMBULATORY_CARE_PROVIDER_SITE_OTHER): Payer: Medicaid Other | Admitting: Family Medicine

## 2020-06-06 ENCOUNTER — Other Ambulatory Visit: Payer: Self-pay

## 2020-06-06 VITALS — BP 134/76 | HR 104 | Wt 348.4 lb

## 2020-06-06 DIAGNOSIS — R000 Tachycardia, unspecified: Secondary | ICD-10-CM | POA: Insufficient documentation

## 2020-06-06 DIAGNOSIS — F959 Tic disorder, unspecified: Secondary | ICD-10-CM

## 2020-06-06 DIAGNOSIS — F418 Other specified anxiety disorders: Secondary | ICD-10-CM | POA: Diagnosis not present

## 2020-06-06 DIAGNOSIS — F419 Anxiety disorder, unspecified: Secondary | ICD-10-CM | POA: Diagnosis not present

## 2020-06-06 MED ORDER — ESCITALOPRAM OXALATE 10 MG PO TABS: 10.0000 mg | ORAL_TABLET | Freq: Every day | ORAL | 0 refills | Status: DC

## 2020-06-06 NOTE — Progress Notes (Signed)
    SUBJECTIVE:   CHIEF COMPLAINT / HPI:   Presents for follow up for anxiety and depression. Seen in clinic on 04/19 and Atarax increased to 10 mg qhs, with plan to increase to TID after one week if needed.  She has been taking medication in morning only.  Has been using small amounts of ETOH and Delta 8 to help with sleep at night.  Since then patient reports a small improvement in symptoms after her morning dose.   Associated symptoms include continued knee shaking, difficulty in large crowds.  Has not had any panic attacks.   Simple Tics Compliant with medication.  She reports having had an episode of total body freezing up once a couple of weeks ago.  Denies any seizure activity or LOC. She reports that she was aware of her surrounding and has not had any further epsodes. Has had similar symptoms in the past but has not been total body.    Tachycardia Denies any chest pain, palpitations or shortness of breath.       PERTINENT  PMH / PSH:  Anxiety/Depression Simple Tics Elevated BMI  OBJECTIVE:   BP 134/76   Pulse (!) 104   Wt (!) 348 lb 6.4 oz (158 kg)   SpO2 99%   BMI 61.72 kg/m    General: Alert, no acute distress Cardio: Tachycardicno r/m/g Pulm: CTAB, normal work of breathing Neuro: CN II: PERRL CN III, IV,VI: EOMI CV V: Normal sensation in V1, V2, V3 CVII: Symmetric smile and brow raise CN VIII: Normal hearing CN IX,X: Symmetric palate raise  CN XI: 5/5 shoulder shrug CN XII: Symmetric tongue protrusion  UE and LE strength 5/5 2+ UE and LE reflexes  Normal sensation in UE and LE bilaterally  No ataxia with finger to nose, normal heel to shin  Negative Rhomberg  Psych: Anxious, good eye contact and engages in conversation.  No SI/HI or hallucinations  ASSESSMENT/PLAN:   Anxiety with depression GAD and PHQ essentially unchanged since last visit.  Taking Atarax qhs. Denies any SI/HI -Start Lexapro 10 mg daily -Continue Atarax 10 mg in morning and  afternoon -Continue to follow with therapist -Patient encouraged to reach out to [facility] for psychiatric evaluation -Mental health resources and 24 hr crisis line provided -Strict return precautions provided -Follow up scheduled May 23 at 350  Simple tics Had an episode of total body freeze.  No LOC.  Aware of surroundings. Had similar symptoms in past but typically not whole body. Exam normal today Compliant with Abilify 5 mg daily.   -Encouraged to follow up with Neurology for possible worsening tics -Strict return precautions provided -Follow up as needed  Tachycardia Continues to be tachycardic.  Denies any chest pain or shortness of breath.  Last TSH wnl.  Likely multifactorial; elevated and uncontrolled anxiety  -ECG shows ST and regular rhythm -Medication for anxiety initiated -Ongoing weight management discussion -Strict return precautions provided -Will continue to monitor    Dana Allan, MD [facility]

## 2020-06-06 NOTE — Patient Instructions (Addendum)
Thank you for coming to see me today. It was a pleasure.   Start Lexapro 10 mg daily.  This is to help with your anxiety.   Continue Atarax 10 mg in the morning and you can take the second dose in the middle of the afternoon if needed.    Follow up with [facility] to schedule an appointment.  Continue to follow up with your therapist Hailey.  Please follow-up with me on May 23 at 350pm  I will refer you for sleep studies. You may need to be referred to the Sleep Specialist for evaluation.  They will call you with an appointment.  If you do not hear anything in the next two-three weeks please let us know and we can look into it.  Your heart rate is still a little high. Will get an ECG to check your heart rhythm today.  I think this is likely due to your anxiety.  If you develop fevers>100.5, shortness of breath, chest pain, palpitations, dizziness, abdominal pain, nausea, vomiting, diarrhea or cannot eat or drink then please go to the ER immediately.  If you have any questions or concerns, please do not hesitate to call the office at [phone].  Best,   Dana Allan, MD

## 2020-06-06 NOTE — Assessment & Plan Note (Addendum)
GAD and PHQ essentially unchanged since last visit.  Taking Atarax qhs. Denies any SI/HI -Start Lexapro 10 mg daily -Continue Atarax 10 mg in morning and afternoon -Continue to follow with therapist -Patient encouraged to reach out to [facility] for psychiatric evaluation -Mental health resources and 24 hr crisis line provided -Strict return precautions provided -Follow up scheduled May 23 at 350

## 2020-06-07 ENCOUNTER — Ambulatory Visit (HOSPITAL_COMMUNITY)
Admission: EM | Admit: 2020-06-07 | Discharge: 2020-06-07 | Disposition: A | Discharge status: Home / Self Care | Payer: Medicaid Other | Attending: Student | Admitting: Student

## 2020-06-07 ENCOUNTER — Encounter: Payer: Self-pay | Admitting: Family Medicine

## 2020-06-07 ENCOUNTER — Telehealth: Payer: Self-pay | Admitting: Gastroenterology

## 2020-06-07 ENCOUNTER — Encounter (HOSPITAL_COMMUNITY): Payer: Self-pay

## 2020-06-07 DIAGNOSIS — Z8616 Personal history of COVID-19: Secondary | ICD-10-CM

## 2020-06-07 DIAGNOSIS — Z87440 Personal history of urinary (tract) infections: Secondary | ICD-10-CM | POA: Insufficient documentation

## 2020-06-07 DIAGNOSIS — R110 Nausea: Secondary | ICD-10-CM | POA: Insufficient documentation

## 2020-06-07 DIAGNOSIS — J069 Acute upper respiratory infection, unspecified: Secondary | ICD-10-CM | POA: Diagnosis not present

## 2020-06-07 DIAGNOSIS — K219 Gastro-esophageal reflux disease without esophagitis: Secondary | ICD-10-CM

## 2020-06-07 DIAGNOSIS — J4521 Mild intermittent asthma with (acute) exacerbation: Secondary | ICD-10-CM | POA: Diagnosis not present

## 2020-06-07 DIAGNOSIS — Z3042 Encounter for surveillance of injectable contraceptive: Secondary | ICD-10-CM | POA: Insufficient documentation

## 2020-06-07 DIAGNOSIS — Z1152 Encounter for screening for COVID-19: Secondary | ICD-10-CM | POA: Insufficient documentation

## 2020-06-07 DIAGNOSIS — Z87898 Personal history of other specified conditions: Secondary | ICD-10-CM | POA: Diagnosis not present

## 2020-06-07 DIAGNOSIS — J301 Allergic rhinitis due to pollen: Secondary | ICD-10-CM | POA: Diagnosis not present

## 2020-06-07 DIAGNOSIS — Z20822 Contact with and (suspected) exposure to covid-19: Secondary | ICD-10-CM | POA: Insufficient documentation

## 2020-06-07 LAB — POCT URINALYSIS DIPSTICK, ED / UC
Bilirubin Urine: NEGATIVE
Glucose, UA: NEGATIVE mg/dL
Hgb urine dipstick: NEGATIVE
Nitrite: NEGATIVE
Protein, ur: NEGATIVE mg/dL
Specific Gravity, Urine: 1.025 (ref 1.005–1.030)
Urobilinogen, UA: 0.2 mg/dL (ref 0.0–1.0)
pH: 5.5 (ref 5.0–8.0)

## 2020-06-07 LAB — CBG MONITORING, ED: Glucose-Capillary: 100 mg/dL — ABNORMAL HIGH (ref 70–99)

## 2020-06-07 LAB — SARS CORONAVIRUS 2 (TAT 6-24 HRS): SARS Coronavirus 2: POSITIVE — AB

## 2020-06-07 LAB — POC URINE PREG, ED: Preg Test, Ur: NEGATIVE

## 2020-06-07 MED ORDER — PROMETHAZINE-DM 6.25-15 MG/5ML PO SYRP: 5.0000 mL | ORAL_SOLUTION | Freq: Four times a day (QID) | ORAL | 0 refills | Status: DC | PRN

## 2020-06-07 NOTE — Assessment & Plan Note (Signed)
Had an episode of total body freeze.  No LOC.  Aware of surroundings. Had similar symptoms in past but typically not whole body. Exam normal today Compliant with Abilify 5 mg daily.   -Encouraged to follow up with Neurology for possible worsening tics -Strict return precautions provided -Follow up as needed

## 2020-06-07 NOTE — ED Triage Notes (Signed)
Pt presents with nausea, vomiting, and dizziness since waking up this morning; pt has chronic abdominal disorder and said nausea, abdominal pain & vomiting is normal for her.

## 2020-06-07 NOTE — Discharge Instructions (Addendum)
-  Promethazine DM cough syrup for congestion/cough. This could make you drowsy, so take at night before bed. -OTC medications for additional relief.  -For fevers/chills, headaches, body aches- Take Tylenol 1000 mg 3 times daily, and ibuprofen 800 mg 3 times daily with food.  You can take these together, or alternate every 3-4 hours. -Continue zyrtec and albuterol inhaler for allergic rhinitis component -Continue protonix for GERD -Drink plenty of fluids and eat a bland diet as tolerated -Seek additional medical attention if you develop new symptoms like chest pain, worsening of dizziness, new/worsening shortness of breath, worsening abdominal pain, etc.

## 2020-06-07 NOTE — Telephone Encounter (Signed)
Pt called because she is having symptoms of vomiting, dizziness, poor appetite. She has an appt w/ Dr. Orvan Falconer on 06/21/20 at 9:30am but was looking for something sooner. Please give a call. Thank you

## 2020-06-07 NOTE — ED Provider Notes (Signed)
[facility]    CSN: [id number] Arrival date & time: 06/07/20  1323      History   Chief Complaint Chief Complaint  Patient presents with  . Nausea    vomi  . Emesis  . Dizziness    HPI Katelyn Aguilar is a 20 y.o. female presenting with nausea, vomiting, dizziness, cough for about 8 hours since waking up-following exposure to confirmed case of COVID-19.  Medical history allergic asthma, allergic rhinitis, pyelonephritis, pilonidal cyst, irregular periods; per patient, she has chronic abdominal pain and disorder with chronic nausea, abdominal pain, vomiting.  States she vomits daily.  She is currently being evaluated by GI, but states they have not figured out what is causing her symptoms.  Diagnosis of GERD moderately well controlled on Protonix.  States she has thrown up twice today, and had 2 episodes of watery diarrhea.  Generalized crampy abdominal pain.  Some coughing, though she is not sure if this is related to allergic rhinitis.  Lightheadedness that is constant, mild at rest but worse with standing and ambulating.  Adamantly denies chest pain, dizziness, shortness of breath, weakness, headaches, vision changes. Denies fevers/chills, shortness of breath, chest pain,  facial pain, teeth pain, headaches, sore throat, loss of taste/smell, swollen lymph nodes, ear pain. Already takes Zyrtec and albuterol inhaler for allergic rhinitis and allergic asthma. Denies urinary symptoms but states her UTIs typically present strangely. Depo contraception, cannot recall LMP.  HPI  Past Medical History:  Diagnosis Date  . Allergic rhinitis   . Anxiety   . Dysrhythmia    tachycardia  . GERD (gastroesophageal reflux disease)   . Obesity     Patient Active Problem List   Diagnosis Date Noted  . BMI 60.0-69.9, adult (HCC) 05/19/2020  . Contraception management 05/19/2020  . Anxiety with depression 05/09/2020  . 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/26/2020  . Pilonidal cyst without  abscess 04/18/2020  . Tachycardia 02/28/2020  . Fatigue 02/28/2020  . Screen for STD (sexually transmitted disease) 01/12/2020  . Irregular menses 12/04/2019  . Pilonidal abscess 12/04/2019  . Pyelonephritis 09/15/2019  . Dizziness 04/22/2019  . Cramp in limb 04/10/2019  . Nausea and vomiting 04/06/2019  . Simple tics 11/17/2018  . Anxiety 11/17/2018  . Involuntary movements 10/21/2018  . Encounter for birth control 10/21/2018    Past Surgical History:  Procedure Laterality Date  . BIOPSY  04/19/2020   Procedure: BIOPSY;  Surgeon: Tressia Danas, MD;  Location: [facility];  Service: Gastroenterology;;  . ESOPHAGOGASTRODUODENOSCOPY    . ESOPHAGOGASTRODUODENOSCOPY (EGD) WITH PROPOFOL N/A 04/19/2020   Procedure: ESOPHAGOGASTRODUODENOSCOPY (EGD) WITH PROPOFOL;  Surgeon: Tressia Danas, MD;  Location: [facility];  Service: Gastroenterology;  Laterality: N/A;  . INCISION AND DRAINAGE ABSCESS N/A 04/26/2020   Procedure: excision of pilonidal cyst with open packing;  Surgeon: Darnell Level, MD;  Location: [facility];  Service: General;  Laterality: N/A;  . NO PAST SURGERIES      OB History   No obstetric history on file.      Home Medications    Prior to Admission medications   Medication Sig Start Date End Date Taking? Authorizing Provider  promethazine-dextromethorphan (PROMETHAZINE-DM) 6.25-15 MG/5ML syrup Take 5 mLs by mouth 4 (four) times daily as needed for cough. 06/07/20  Yes Rhys Martini, PA-C  acetaminophen (TYLENOL) 325 MG tablet Take 650 mg by mouth every 6 (six) hours as needed for moderate pain.    [provider]  albuterol (VENTOLIN HFA) 108 (90 Base)  MCG/ACT inhaler Inhale 1-2 puffs into the lungs every 6 (six) hours as needed for wheezing or shortness of breath.    [provider]  ARIPiprazole (ABILIFY) 5 MG tablet Take 1 tablet (5 mg total) by mouth daily. 05/03/20   Levert Feinstein, MD  calcium carbonate (TUMS - DOSED IN MG ELEMENTAL CALCIUM) 500  MG chewable tablet Chew 1 tablet by mouth daily as needed for indigestion or heartburn.    [provider]  cetirizine (ZYRTEC) 10 MG tablet Take 1 tablet (10 mg total) by mouth daily. 11/10/19   Dana Allan, MD  escitalopram (LEXAPRO) 10 MG tablet Take 1 tablet (10 mg total) by mouth daily. 06/06/20   Dana Allan, MD  hydrOXYzine (ATARAX/VISTARIL) 10 MG tablet Take 1 tablet (10 mg total) by mouth 2 (two) times daily as needed. May increase to three times a day after 1 week if needed 05/17/20   Dana Allan, MD  Multiple Vitamin (MULTIVITAMIN WITH MINERALS) TABS tablet Take 1 tablet by mouth daily.    [provider]  pantoprazole (PROTONIX) 40 MG tablet Take 1 tablet (40 mg total) by mouth 2 (two) times daily. 03/09/20   Tressia Danas, MD    Family History Family History  Problem Relation Age of Onset  . COPD Mother   . Depression Mother   . Cancer Mother        blood cancer  . Diabetes Other     Social History Social History   Tobacco Use  . Smoking status: Passive Smoke Exposure - Never Smoker  . Smokeless tobacco: Never Used  Vaping Use  . Vaping Use: Some days  Substance Use Topics  . Alcohol use: Yes    Comment: occasional  . Drug use: No     Allergies   Patient has no known allergies.   Review of Systems Review of Systems  Constitutional: Negative for appetite change, chills and fever.  HENT: Positive for congestion. Negative for ear pain, rhinorrhea, sinus pressure, sinus pain and sore throat.   Eyes: Negative for redness and visual disturbance.  Respiratory: Positive for cough. Negative for chest tightness, shortness of breath and wheezing.   Cardiovascular: Negative for chest pain and palpitations.  Gastrointestinal: Positive for abdominal pain, diarrhea, nausea and vomiting. Negative for constipation.  Genitourinary: Negative for dysuria, frequency and urgency.  Musculoskeletal: Negative for myalgias.  Neurological: Negative for dizziness,  weakness and headaches.  Psychiatric/Behavioral: Negative for confusion.  All other systems reviewed and are negative.    Physical Exam Triage Vital Signs ED Triage Vitals [06/07/20 1458]  Enc Vitals Group     BP      Pulse      Resp      Temp      Temp src      SpO2      Weight      Height      Head Circumference      Peak Flow      Pain Score 5     Pain Loc      Pain Edu?      Excl. in GC?    No data found.  Updated Vital Signs BP 131/81 (BP Location: Right Arm)   Pulse (!) 110 Comment: normal tachycardia  Temp 98.4 F (36.9 C) (Oral)   Resp 20   SpO2 95%   Visual Acuity Right Eye Distance:   Left Eye Distance:   Bilateral Distance:    Right Eye Near:   Left Eye Near:  Bilateral Near:     Physical Exam Vitals reviewed.  Constitutional:      General: She is not in acute distress.    Appearance: Normal appearance. She is obese. She is not ill-appearing or diaphoretic.  HENT:     Head: Normocephalic and atraumatic.     Right Ear: Hearing, tympanic membrane, ear canal and external ear normal. No swelling or tenderness. There is no impacted cerumen. No mastoid tenderness. Tympanic membrane is not perforated, erythematous, retracted or bulging.     Left Ear: Hearing, tympanic membrane, ear canal and external ear normal. No swelling or tenderness. There is no impacted cerumen. No mastoid tenderness. Tympanic membrane is not perforated, erythematous, retracted or bulging.     Nose:     Right Sinus: No maxillary sinus tenderness or frontal sinus tenderness.     Left Sinus: No maxillary sinus tenderness or frontal sinus tenderness.     Mouth/Throat:     Mouth: Mucous membranes are moist.     Pharynx: Uvula midline. No oropharyngeal exudate or posterior oropharyngeal erythema.     Tonsils: No tonsillar exudate.  Eyes:     Extraocular Movements: Extraocular movements intact.     Pupils: Pupils are equal, round, and reactive to light.  Cardiovascular:     Rate  and Rhythm: Regular rhythm. Tachycardia present.     Pulses:          Radial pulses are 2+ on the right side and 2+ on the left side.     Heart sounds: Normal heart sounds.  Pulmonary:     Effort: Pulmonary effort is normal.     Breath sounds: Normal breath sounds and air entry. No wheezing, rhonchi or rales.  Chest:     Chest wall: No tenderness.  Abdominal:     General: Abdomen is flat. Bowel sounds are normal.     Palpations: Abdomen is soft.     Tenderness: There is no abdominal tenderness. There is no guarding or rebound.  Musculoskeletal:     Right lower leg: No edema.     Left lower leg: No edema.  Lymphadenopathy:     Cervical: No cervical adenopathy.  Skin:    General: Skin is warm.     Capillary Refill: Capillary refill takes less than 2 seconds.  Neurological:     General: No focal deficit present.     Mental Status: She is alert and oriented to person, place, and time.  Psychiatric:        Attention and Perception: Attention and perception normal.        Mood and Affect: Mood and affect normal.        Behavior: Behavior normal. Behavior is cooperative.        Thought Content: Thought content normal.        Judgment: Judgment normal.      UC Treatments / Results  Labs (all labs ordered are listed, but only abnormal results are displayed) Labs Reviewed  POCT URINALYSIS DIPSTICK, ED / UC - Abnormal; Notable for the following components:      Result Value   Ketones, ur TRACE (*)    Leukocytes,Ua SMALL (*)    All other components within normal limits  CBG MONITORING, ED - Abnormal; Notable for the following components:   Glucose-Capillary 100 (*)    All other components within normal limits  SARS CORONAVIRUS 2 (TAT 6-24 HRS)  POC URINE PREG, ED    EKG   Radiology No results found.  Procedures  Procedures (including critical care time)  Medications Ordered in UC Medications - No data to display  Initial Impression / Assessment and Plan / UC Course   I have reviewed the triage vital signs and the nursing notes.  Pertinent labs & imaging results that were available during my care of the patient were reviewed by me and considered in my medical decision making (see chart for details).     This patient is a 20 year old female presenting with viral URI symptoms following exposure to COVID-19. Benign exam. Today this pt is mildly tachycardic at baseline but afebrile nontachypneic, oxygenating well on room air, no wheezes rhonchi or rales. Benign neuro exam.   EKG performed one day ago at PCP office, given long history of tachycardia. Discussed that cardiac pathology can be a cause of dizziness. Patient declines additional EKG today. POC CBG nonfasting 100. Pt requests UA, states she has history of atypical UTIs and pyelo. No urinary symptoms. UA with small leuk, otherwise wnl; given lack of urinary symptoms, will send culture and defer treatment until this results.  Denies STI risk and vaginal symptoms, so STI swab deferred. Urine pregnancy negative. Depo for contraception.  Exposure to covid; COVID PCR sent. Isolation as per CDC guidelines. She is vaccinated but not boosted.   For allergic rhinitis component, continue zyrtec and albuterol. Promethazine DM for cough. Continue protonix for GERD. She declines antiemetic.  ED return precautions discussed.   Coding this visit a Level 4 given chronic illness with exacerbation (GERD and nausea); review of past notes (PCP note from 06/06/20) and past tests (EKG 06/06/20); prescription drug management. I also spent over 40 minutes evaluating this patient's MANY conditions, both acute and chronic; performing exam; interpreting test; discussing treatment plan; prescription medications; discussing plan for follow-up.  Final Clinical Impressions(s) / UC Diagnoses   Final diagnoses:  Exposure to confirmed case of COVID-19  Encounter for screening for COVID-19  Depo-Provera contraceptive status  Seasonal  allergic rhinitis due to pollen  Gastroesophageal reflux disease without esophagitis  History of UTI  Viral URI with cough  History of tachycardia  Chronic nausea  Mild intermittent extrinsic asthma with acute exacerbation  History of COVID-19     Discharge Instructions     -Promethazine DM cough syrup for congestion/cough. This could make you drowsy, so take at night before bed. -OTC medications for additional relief.  -For fevers/chills, headaches, body aches- Take Tylenol 1000 mg 3 times daily, and ibuprofen 800 mg 3 times daily with food.  You can take these together, or alternate every 3-4 hours. -Continue zyrtec and albuterol inhaler for allergic rhinitis component -Continue protonix for GERD -Drink plenty of fluids and eat a bland diet as tolerated -Seek additional medical attention if you develop new symptoms like chest pain, worsening of dizziness, new/worsening shortness of breath, worsening abdominal pain, etc.    ED Prescriptions    Medication Sig Dispense Auth. Provider   promethazine-dextromethorphan (PROMETHAZINE-DM) 6.25-15 MG/5ML syrup Take 5 mLs by mouth 4 (four) times daily as needed for cough. 118 mL Rhys MartiniGraham, Takoda Siedlecki E, PA-C     PDMP not reviewed this encounter.   Rhys MartiniGraham, Darean Rote E, PA-C 06/07/20 1619

## 2020-06-07 NOTE — Assessment & Plan Note (Signed)
Continues to be tachycardic.  Denies any chest pain or shortness of breath.  Last TSH wnl.  Likely multifactorial; elevated and uncontrolled anxiety  -ECG shows ST and regular rhythm -Medication for anxiety initiated -Ongoing weight management discussion -Strict return precautions provided -Will continue to monitor

## 2020-06-07 NOTE — Telephone Encounter (Signed)
Called and spoke with pt and she is currently waiting at urgent care to be seen. Pt states she also tested positive for covid so that may be part of her problem. She just wanted to let us know.

## 2020-06-08 ENCOUNTER — Telehealth: Payer: Self-pay | Admitting: Unknown Physician Specialty

## 2020-06-08 MED ORDER — MOLNUPIRAVIR EUA 200MG CAPSULE: 4.0000 | ORAL_CAPSULE | Freq: Two times a day (BID) | ORAL | 0 refills | Status: AC

## 2020-06-08 MED ORDER — PROCHLORPERAZINE MALEATE 5 MG PO TABS
5.0000 mg | ORAL_TABLET | Freq: Four times a day (QID) | ORAL | 0 refills | Status: DC | PRN
Start: 2020-06-08 — End: 2020-07-12

## 2020-06-08 NOTE — Telephone Encounter (Signed)
Outpatient Oral COVID Treatment Note  I connected with Katelyn Aguilar on 06/08/2020/9:27 AM by telephone and verified that I am speaking with the correct person using two identifiers.  I discussed the limitations, risks, security, and privacy concerns of performing an evaluation and management service by telephone and the availability of in person appointments. I also discussed with the patient that there may be a patient responsible charge related to this service. The patient expressed understanding and agreed to proceed.  Patient location: home Provider location: home  Diagnosis: COVID-19 infection  Purpose of visit: Discussion of potential use of Molnupiravir or Paxlovid, a new treatment for mild to moderate COVID-19 viral infection in non-hospitalized patients.   Subjective: Patient is a 20 y.o. female who has been diagnosed with COVID 19 viral infection.  Their symptoms began on 5/9 with nausea.    Past Medical History:  Diagnosis Date  . Allergic rhinitis   . Anxiety   . Dysrhythmia    tachycardia  . GERD (gastroesophageal reflux disease)   . Obesity     No Known Allergies   Current Outpatient Medications:  .  acetaminophen (TYLENOL) 325 MG tablet, Take 650 mg by mouth every 6 (six) hours as needed for moderate pain., Disp: , Rfl:  .  albuterol (VENTOLIN HFA) 108 (90 Base) MCG/ACT inhaler, Inhale 1-2 puffs into the lungs every 6 (six) hours as needed for wheezing or shortness of breath., Disp: , Rfl:  .  ARIPiprazole (ABILIFY) 5 MG tablet, Take 1 tablet (5 mg total) by mouth daily., Disp: 90 tablet, Rfl: 0 .  calcium carbonate (TUMS - DOSED IN MG ELEMENTAL CALCIUM) 500 MG chewable tablet, Chew 1 tablet by mouth daily as needed for indigestion or heartburn., Disp: , Rfl:  .  cetirizine (ZYRTEC) 10 MG tablet, Take 1 tablet (10 mg total) by mouth daily., Disp: 30 tablet, Rfl: 11 .  escitalopram (LEXAPRO) 10 MG tablet, Take 1 tablet (10 mg total) by mouth daily., Disp: 30 tablet,  Rfl: 0 .  hydrOXYzine (ATARAX/VISTARIL) 10 MG tablet, Take 1 tablet (10 mg total) by mouth 2 (two) times daily as needed. May increase to three times a day after 1 week if needed, Disp: 90 tablet, Rfl: 0 .  Multiple Vitamin (MULTIVITAMIN WITH MINERALS) TABS tablet, Take 1 tablet by mouth daily., Disp: , Rfl:  .  pantoprazole (PROTONIX) 40 MG tablet, Take 1 tablet (40 mg total) by mouth 2 (two) times daily., Disp: 60 tablet, Rfl: 3 .  promethazine-dextromethorphan (PROMETHAZINE-DM) 6.25-15 MG/5ML syrup, Take 5 mLs by mouth 4 (four) times daily as needed for cough., Disp: 118 mL, Rfl: 0  Objective: Patient appears/sounds congested.  They are in no apparent distress.  Breathing is non labored.  Mood and behavior are normal.  Laboratory Data:  Recent Results (from the past 2160 hour(s))  SARS CORONAVIRUS 2 (TAT 6-24 HRS) Nasopharyngeal Nasopharyngeal Swab     Status: None   Collection Time: 04/15/20  9:37 AM   Specimen: Nasopharyngeal Swab  Result Value Ref Range   SARS Coronavirus 2 NEGATIVE NEGATIVE    Comment: (NOTE) SARS-CoV-2 target nucleic acids are NOT DETECTED.  The SARS-CoV-2 RNA is generally detectable in upper and lower respiratory specimens during the acute phase of infection. Negative results do not preclude SARS-CoV-2 infection, do not rule out co-infections with other pathogens, and should not be used as the sole basis for treatment or other patient management decisions. Negative results must be combined with clinical observations, patient history, and epidemiological information.  The expected result is Negative.  Fact Sheet for Patients: HairSlick.nohttps://www.fda.gov/media/138098/download  Fact Sheet for Healthcare Providers: quierodirigir.comhttps://www.fda.gov/media/138095/download  This test is not yet approved or cleared by the [place] FDA and  has been authorized for detection and/or diagnosis of SARS-CoV-2 by FDA under an Emergency Use Authorization (EUA). This EUA will remain  in  effect (meaning this test can be used) for the duration of the COVID-19 declaration under Se ction 564(b)(1) of the Act, 21 U.S.C. section 360bbb-3(b)(1), unless the authorization is terminated or revoked sooner.  Performed at [facility], [address] [place], [place] [address]   Pregnancy, urine     Status: None   Collection Time: 04/19/20 10:11 AM  Result Value Ref Range   Preg Test, Ur NEGATIVE NEGATIVE    Comment:        THE SENSITIVITY OF THIS METHODOLOGY IS >20 mIU/mL. Performed at [facility], [address] [place], [place] [address]   Surgical pathology     Status: None   Collection Time: 04/19/20 11:26 AM  Result Value Ref Range   SURGICAL PATHOLOGY      SURGICAL PATHOLOGY CASE: [id number] PATIENT: Katelyn Aguilar Surgical Pathology Report     Clinical History: Recurrent nausea and vomiting, GERD (crm)     FINAL MICROSCOPIC DIAGNOSIS:  A. DUODENUM, BIOPSY: - Duodenal mucosa with no specific histopathologic changes - Negative for increased intraepithelial lymphocytes or villous architectural changes  B. 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 - Warthin Starry stain is negative for Helicobacter pylori  C. ESOPHAGUS, DISTAL, BIOPSY: - Esophageal squamous mucosa with no specific histopathologic changes - Negative for increased intraepithelial eosinophils  D. ESOPHAGUS, MID/PROXIMAL, BIOPSY: - Esophageal squamous mucosa with no specific histopathologic changes - Negative for increased intraepithelial eosinophils      GROS S DESCRIPTION:  A: Received in formalin are tan, soft tissue fragments that are submitted in toto. Number: 3 size: 0.2-0.3 cm blocks: 1  B: Received in formalin are tan, soft tissue fragments that are submitted in  toto. Number: 4 size: 0.2-0.5 cm blocks: 1  C: Received in formalin are tan, soft tissue fragments that are submitted in toto. Number: 2 size: 0.4 and 0.5 cm blocks: 1  D: Received in formalin is a tan, soft tissue fragment that is submitted in toto.  Size: 0.5 cm, 1 block submitted.  [facility](GRP 04/19/2020)   Final Diagnosis performed by Holley BoucheNilesh Kashikar, MD.   Electronically signed 04/20/2020 Technical and / or Professional components performed at [facility], [address] [place], [place] [address].  Immunohistochemistry Technical component (if applicable) was performed at [facility]. [address], [place], [place] [address].   IMMUNOHISTOCHEMISTRY DISCLAIMER (if applicable): Some of these immunohistochemica l stains may have been developed and the performance characteristics determine by [facility]. Some may not have been cleared or approved by the U.S. Food and Drug Administration. The FDA has determined that such clearance or approval is not necessary. This test is used for clinical purposes. It should not be regarded as investigational or for research. This laboratory is certified under the Clinical Laboratory Improvement Amendments of 1988 (CLIA-88) as qualified to perform high complexity clinical laboratory testing.  The controls stained appropriately.   CBC per protocol     Status: Abnormal   Collection Time:  04/20/20  1:16 PM  Result Value Ref Range   WBC 14.2 (H) 4.0 - 10.5 K/uL   RBC 4.64 3.87 - 5.11 MIL/uL   Hemoglobin 12.9 12.0 - 15.0 g/dL   HCT 50.9 32.6 - 71.2 %   MCV 86.4 80.0 - 100.0 fL   MCH 27.8 26.0 - 34.0 pg   MCHC 32.2 30.0 - 36.0 g/dL   RDW 45.8 09.9 - 83.3 %   Platelets 314 150 - 400 K/uL   nRBC 0.0 0.0 - 0.2 %    Comment: Performed at [facility], [address] [place], [place] [address]  SARS CORONAVIRUS 2 (TAT 6-24 HRS) Nasopharyngeal Nasopharyngeal Swab     Status:  None   Collection Time: 04/22/20 11:13 AM   Specimen: Nasopharyngeal Swab  Result Value Ref Range   SARS Coronavirus 2 NEGATIVE NEGATIVE    Comment: (NOTE) SARS-CoV-2 target nucleic acids are NOT DETECTED.  The SARS-CoV-2 RNA is generally detectable in upper and lower respiratory specimens during the acute phase of infection. Negative results do not preclude SARS-CoV-2 infection, do not rule out co-infections with other pathogens, and should not be used as the sole basis for treatment or other patient management decisions. Negative results must be combined with clinical observations, patient history, and epidemiological information. The expected result is Negative.  Fact Sheet for Patients: HairSlick.no  Fact Sheet for Healthcare Providers: quierodirigir.com  This test is not yet approved or cleared by the [place] FDA and  has been authorized for detection and/or diagnosis of SARS-CoV-2 by FDA under an Emergency Use Authorization (EUA). This EUA will remain  in effect (meaning this test can be used) for the duration of the COVID-19 declaration under Se ction 564(b)(1) of the Act, 21 U.S.C. section 360bbb-3(b)(1), unless the authorization is terminated or revoked sooner.  Performed at [facility], [address], [place], [place] [address]   Pregnancy, urine per protocol     Status: None   Collection Time: 04/26/20  5:12 AM  Result Value Ref Range   Preg Test, Ur NEGATIVE NEGATIVE    Comment:        THE SENSITIVITY OF THIS METHODOLOGY IS >20 mIU/mL. Performed at [facility], [address] [place], [place] [address]   Surgical pathology     Status: None   Collection Time: 04/26/20  8:02 AM  Result Value Ref Range   SURGICAL PATHOLOGY      SURGICAL PATHOLOGY CASE: [id number] PATIENT: Katelyn Aguilar Surgical Pathology Report     Clinical History: Pilonidal cyst  (jmc)     FINAL MICROSCOPIC DIAGNOSIS:  A. PILONIDAL CYST, EXCISION: - Pilonidal cyst/sinus.   GROSS DESCRIPTION:  The specimen is received fresh and consists of a 6.0 x 3.5 x 2.7 cm piece of yellow-red adipose tissue, with a 4.5 x 1.5 cm disrupted ellipse of tan skin on one surface.  Sectioning reveals a tan-red, softened cystic structure which tracks along the length of the specimen. The cyst contains a small amount of tan-brown hair.  A representative section is submitted in 1 cassette. Lovey Newcomer 04/26/2020)    Final Diagnosis performed by Consuello Bossier, MD.   Electronically signed 04/27/2020 Technical component performed at [facility], [address], [place], [place] [address].  Professional component performed at [facility], [address] [place], [place] [address].  Immunohistochem Presenter, broadcasting component (if applicable) was performed at [facility]. [address], [place],  [place] [address].   IMMUNOHISTOCHEMISTRY DISCLAIMER (if applicable): Some of these immunohistochemical stains may have been developed and the performance characteristics determine by [facility]. Some may not have been cleared or approved by the U.S. Food and Drug Administration. The FDA has determined that such clearance or approval is not necessary. This test is used for clinical purposes. It should not be regarded as investigational or for research. This laboratory is certified under the Clinical Laboratory Improvement Amendments of 1988 (CLIA-88) as qualified to perform high complexity clinical laboratory testing.  The controls stained appropriately.   CBC     Status: None   Collection Time: 04/26/20 10:58 AM  Result Value Ref Range   WBC 10.0 4.0 - 10.5 K/uL   RBC 4.60 3.87 - 5.11 MIL/uL   Hemoglobin 13.0 12.0 - 15.0 g/dL   HCT 25.0 53.9 - 76.7 %   MCV 86.3 80.0 - 100.0 fL   MCH 28.3 26.0 - 34.0 pg    MCHC 32.7 30.0 - 36.0 g/dL   RDW 34.1 93.7 - 90.2 %   Platelets 251 150 - 400 K/uL   nRBC 0.0 0.0 - 0.2 %    Comment: Performed at [facility], [address], [place], [place] [address]  Creatinine, serum     Status: None   Collection Time: 04/26/20 10:58 AM  Result Value Ref Range   Creatinine, Ser 0.96 0.44 - 1.00 mg/dL   GFR, Estimated >53 >29 mL/min    Comment: (NOTE) Calculated using the CKD-EPI Creatinine Equation (2021) Performed at [facility], [address] [place], [place] [address]   SARS CORONAVIRUS 2 (TAT 6-24 HRS) Nasopharyngeal Nasopharyngeal Swab     Status: Abnormal   Collection Time: 06/07/20  3:14 PM   Specimen: Nasopharyngeal Swab  Result Value Ref Range   SARS Coronavirus 2 POSITIVE (A) NEGATIVE    Comment: (NOTE) SARS-CoV-2 target nucleic acids are DETECTED.  The SARS-CoV-2 RNA is generally detectable in upper and lower respiratory specimens during the acute phase of infection. Positive results are indicative of the presence of SARS-CoV-2 RNA. Clinical correlation with patient history and other diagnostic information is  necessary to determine patient infection status. Positive results do not rule out bacterial infection or co-infection with other viruses.  The expected result is Negative.  Fact Sheet for Patients: HairSlick.no  Fact Sheet for Healthcare Providers: quierodirigir.com  This test is not yet approved or cleared by the [place] FDA and  has been authorized for detection and/or diagnosis of SARS-CoV-2 by FDA under an Emergency Use Authorization (EUA). This EUA will remain  in effect (meaning this test can be used) for the duration of the COVID-19 declaration under Section 564(b)(1) of the Act, 21 U. S.C. section 360bbb-3(b)(1), unless the authorization is terminated or revoked sooner.   Performed at [facility], [address] [place], [place] [address]   POC Urinalysis dipstick     Status: Abnormal   Collection Time: 06/07/20  3:16 PM  Result Value Ref Range   Glucose, UA NEGATIVE NEGATIVE mg/dL   Bilirubin Urine NEGATIVE NEGATIVE   Ketones, ur TRACE (A) NEGATIVE mg/dL   Specific Gravity, Urine 1.025 1.005 - 1.030   Hgb urine dipstick NEGATIVE NEGATIVE   pH 5.5 5.0 - 8.0   Protein, ur NEGATIVE NEGATIVE mg/dL   Urobilinogen, UA 0.2 0.0 - 1.0 mg/dL   Nitrite NEGATIVE NEGATIVE   Leukocytes,Ua SMALL (A) NEGATIVE    Comment: Biochemical Testing Only.  Please order routine urinalysis from main lab if confirmatory testing is needed.  POC CBG monitoring     Status: Abnormal   Collection Time: 06/07/20  3:22 PM  Result Value Ref Range   Glucose-Capillary 100 (H) 70 - 99 mg/dL    Comment: Glucose reference range applies only to samples taken after fasting for at least 8 hours.  POC urine pregnancy     Status: None   Collection Time: 06/07/20  3:24 PM  Result Value Ref Range   Preg Test, Ur NEGATIVE NEGATIVE    Comment:        THE SENSITIVITY OF THIS METHODOLOGY IS >24 mIU/mL      Assessment: 20 y.o. female with mild/moderate COVID 19 viral infection diagnosed on 5/10 at high risk for progression to severe COVID 19.  Plan:  This patient is a 20 y.o. female that meets the following criteria for Emergency Use Authorization of: Molnupiravir  1. Age >18 yr 2. SARS-COV-2 positive test 3. Symptom onset < 5 days 4. Mild-to-moderate COVID disease with high risk for severe progression to hospitalization or death   I have spoken and communicated the following to the patient or parent/caregiver regarding: 1. Molnupiravir is an unapproved drug that is authorized for use under an TEFL teacher.  2. There are no adequate, approved, available products for the treatment of COVID-19 in adults who have mild-to-moderate COVID-19 and are at high risk for progressing to severe COVID-19, including hospitalization  or death. 3. Other therapeutics are currently authorized. For additional information on all products authorized for treatment or prevention of COVID-19, please see https://www.graham-miller.com/.  4. There are benefits and risks of taking this treatment as outlined in the "Fact Sheet for Patients and Caregivers."  5. "Fact Sheet for Patients and Caregivers" was reviewed with patient. A hard copy will be provided to patient from pharmacy prior to the patient receiving treatment. 6. Patients should continue to self-isolate and use infection control measures (e.g., wear mask, isolate, social distance, avoid sharing personal items, clean and disinfect "high touch" surfaces, and frequent handwashing) according to CDC guidelines.  7. The patient or parent/caregiver has the option to accept or refuse treatment. 8. [employer] has established a pregnancy surveillance program. 9. Females of childbearing potential should use a reliable method of contraception correctly and consistently, as applicable, for the duration of treatment and for 4 days after the last dose of Molnupiravir. 10. Males of reproductive potential who are sexually active with females of childbearing potential should use a reliable method of contraception correctly and consistently during treatment and for at least 3 months after the last dose. 11. Pregnancy status and risk was assessed. Patient verbalized understanding of precautions.   After reviewing above information with the patient, the patient agrees to receive molnupiravir.  Follow up instructions:    . Take prescription BID x 5 days as directed . Reach out to pharmacist for counseling on medication if desired . For concerns regarding further COVID symptoms please follow up with your PCP or urgent care . For urgent or life-threatening issues, seek care at your local emergency  department  The patient was provided an opportunity to ask questions, and all were answered. The patient agreed with the plan and demonstrated an understanding of the instructions.   Script sent to [facility]  The patient was advised to call their PCP or seek an in-person evaluation if the symptoms worsen or if the condition fails to improve as anticipated.   I  provided 20 minutes of non face-to-face telephone visit time during this encounter, and > 50% was spent counseling as documented under my assessment & plan.  Gabriel Cirri, NP 06/08/2020 /9:27 AM  Compazine given for nausea as Zofran doesn't work

## 2020-06-16 DIAGNOSIS — F411 Generalized anxiety disorder: Secondary | ICD-10-CM | POA: Diagnosis not present

## 2020-06-18 NOTE — Patient Instructions (Signed)
Thank you for coming to see me today. It was a pleasure. Today we talked about:   Continue Lexapro 10 mg daily  Can increase Atarax to 10 mg three times a day as needed  Breast exam is normal.  Can take some Ibuprofen three times a day for two weeks.  Try heat and ice packs as needed.  If symptoms worsen please let me know and we can further evaluate.  Please follow-up with PCP in 4 weeks  If you have any questions or concerns, please do not hesitate to call the office at [phone].  Best,   Dana Allan, MD

## 2020-06-18 NOTE — Progress Notes (Signed)
    SUBJECTIVE:   CHIEF COMPLAINT / HPI:   Presents for follow up for anxiety and depression. Seen in clinic on 05/09 and started on Lexapro 10 mg daily and continued Atarax 10 mg BID.  Since then patient reports has not seen much change with addition of Lexapro. Associated symptoms include right breat pain.  Left breat pain Noted intermittent sharp left breast pain after initiation of Lexapro.  Denies any trauma, fevers, redness, lumps, skin changes or nipple discharge.  Endorses increased fullness of right breast and intermittent sharp pain.  Noted that she has been lifting her nephew a little more lately,unsure weight.  Took pregnancy test and negative, also noted that she has not skipped any Depo Provea injections.  No family history of breast cancers. Has had similar symptoms in the past prior to starting Lexapro  PERTINENT  PMH / PSH:  Anxiety/Depression Simple Tics   OBJECTIVE:   BP 124/86   Pulse 98   Ht 5\' 3"  (1.6 m)   Wt (!) 344 lb 6.4 oz (156.2 kg)   SpO2 98%   BMI 61.01 kg/m    General: Alert, no acute distress Breast: no edema, erythema, nipple discharge, masses, lumps or skin changes noted. 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 No SI/HI Continue Lexapro 10 mg daily Increase Atarax 10 mg TID Continue therapy 24hr crisis line provided Strict return precautions preovided Follow up scheduled for June 30  Pain of left breast Breast exam benign.  Anterior chest wall tenderness.  Likely MSK pain. I do not think that imaging is warranted at this time given benign exam and no risk factors.  However, I did offer ultrasound breast if patient wanted to continue to work up.  After discussion we opted to wait and if continues to be of concern in near future will reevaluate for imaging. Reassurance provided that ws not related to starting  Lexapro Tylenol prn Ibuprofen 600mg  TID as needed Heat/Ice as needed Follow up as needed or if symptoms worsen     July 02, MD [facility]

## 2020-06-20 ENCOUNTER — Encounter: Payer: Self-pay | Admitting: Family Medicine

## 2020-06-20 ENCOUNTER — Other Ambulatory Visit: Payer: Self-pay

## 2020-06-20 ENCOUNTER — Ambulatory Visit (INDEPENDENT_AMBULATORY_CARE_PROVIDER_SITE_OTHER): Payer: Medicaid Other | Admitting: Family Medicine

## 2020-06-20 VITALS — BP 124/86 | HR 98 | Ht 63.0 in | Wt 344.4 lb

## 2020-06-20 DIAGNOSIS — F418 Other specified anxiety disorders: Secondary | ICD-10-CM

## 2020-06-20 DIAGNOSIS — N644 Mastodynia: Secondary | ICD-10-CM

## 2020-06-20 MED ORDER — IBUPROFEN 600 MG PO TABS: 600.0000 mg | ORAL_TABLET | Freq: Three times a day (TID) | ORAL | 0 refills | Status: DC | PRN

## 2020-06-21 ENCOUNTER — Ambulatory Visit: Payer: Medicaid Other | Admitting: Gastroenterology

## 2020-06-26 ENCOUNTER — Encounter: Payer: Self-pay | Admitting: Family Medicine

## 2020-06-26 DIAGNOSIS — N644 Mastodynia: Secondary | ICD-10-CM | POA: Insufficient documentation

## 2020-06-26 NOTE — Assessment & Plan Note (Signed)
Breast exam benign.  Anterior chest wall tenderness.  Likely MSK pain. I do not think that imaging is warranted at this time given benign exam and no risk factors.  However, I did offer ultrasound breast if patient wanted to continue to work up.  After discussion we opted to wait and if continues to be of concern in near future will reevaluate for imaging. Reassurance provided that ws not related to starting Lexapro Tylenol prn Ibuprofen 600mg  TID as needed Heat/Ice as needed Follow up as needed or if symptoms worsen

## 2020-06-26 NOTE — Assessment & Plan Note (Addendum)
PHQ9 (4), GAD (12) Decreased from last visit. No SI/HI Continue Lexapro 10 mg daily Increase Atarax 10 mg TID Continue therapy 24hr crisis line provided Strict return precautions preovided Follow up scheduled for June 30

## 2020-06-29 DIAGNOSIS — Z419 Encounter for procedure for purposes other than remedying health state, unspecified: Secondary | ICD-10-CM | POA: Diagnosis not present

## 2020-07-07 DIAGNOSIS — F411 Generalized anxiety disorder: Secondary | ICD-10-CM | POA: Diagnosis not present

## 2020-07-12 ENCOUNTER — Encounter: Payer: Self-pay | Admitting: Physician Assistant

## 2020-07-12 ENCOUNTER — Ambulatory Visit: Payer: Medicaid Other | Admitting: Physician Assistant

## 2020-07-12 VITALS — BP 122/78 | HR 100 | Ht 63.0 in | Wt 339.4 lb

## 2020-07-12 DIAGNOSIS — K219 Gastro-esophageal reflux disease without esophagitis: Secondary | ICD-10-CM

## 2020-07-12 DIAGNOSIS — R112 Nausea with vomiting, unspecified: Secondary | ICD-10-CM | POA: Diagnosis not present

## 2020-07-12 DIAGNOSIS — K3184 Gastroparesis: Secondary | ICD-10-CM

## 2020-07-12 MED ORDER — METOCLOPRAMIDE HCL 5 MG PO TABS: 5.0000 mg | ORAL_TABLET | Freq: Four times a day (QID) | ORAL | 3 refills | Status: DC

## 2020-07-12 NOTE — Progress Notes (Signed)
Chief Complaint: Follow-up gastric emptying study  HPI:    Katelyn Aguilar is a 20 year old female with a past medical history as listed below including anxiety and reflux as well as obesity, known to Dr. Orvan Falconer, who returns to clinic today for follow-up after recent gastric emptying study.    03/09/2020 patient seen in clinic by Dr. Orvan Falconer for GERD with recurrent nausea and vomiting not explained by CT normal liver enzymes and lipase as well as negative H. pylori breath test.  At that time EGD scheduled to rule out gastric intestinal pathology.  Pantoprazole increased to 40 mg twice daily.    04/19/2020 EGD with normal esophagus, mild erosive gastropathy and normal duodenum.  Famotidine was added 20 mg twice daily.    05/18/2020 gastric emptying study showed delayed gastric emptying which is consistent with gastroparesis.  Was recommended patient to a low residue/low fiber/low fat diet, multiple small meals a day, 5-6, separate solids and liquids and drink only sips during meals and hydrate with 4 ounces of water at a time in between meals.    Today, the patient tells me she continues with epigastric pain and vomiting regardless of medications.  She is wondering what the results mean and she saw them online and has not received a phone call from our office yet.  Tells me that she read recommendations for diet changes and is not sure how much she can abide by this because "we can not afford that kind of diet".    Denies fever, chills or weight loss.  Past Medical History:  Diagnosis Date   Allergic rhinitis    Anxiety    Dysrhythmia    tachycardia   GERD (gastroesophageal reflux disease)    Obesity     Past Surgical History:  Procedure Laterality Date   BIOPSY  04/19/2020   Procedure: BIOPSY;  Surgeon: Tressia Danas, MD;  Location: [facility];  Service: Gastroenterology;;   ESOPHAGOGASTRODUODENOSCOPY     ESOPHAGOGASTRODUODENOSCOPY (EGD) WITH PROPOFOL N/A 04/19/2020   Procedure:  ESOPHAGOGASTRODUODENOSCOPY (EGD) WITH PROPOFOL;  Surgeon: Tressia Danas, MD;  Location: [facility];  Service: Gastroenterology;  Laterality: N/A;   INCISION AND DRAINAGE ABSCESS N/A 04/26/2020   Procedure: excision of pilonidal cyst with open packing;  Surgeon: Darnell Level, MD;  Location: [facility];  Service: General;  Laterality: N/A;   NO PAST SURGERIES      Current Outpatient Medications  Medication Sig Dispense Refill   acetaminophen (TYLENOL) 325 MG tablet Take 650 mg by mouth every 6 (six) hours as needed for moderate pain.     albuterol (VENTOLIN HFA) 108 (90 Base) MCG/ACT inhaler Inhale 1-2 puffs into the lungs every 6 (six) hours as needed for wheezing or shortness of breath.     ARIPiprazole (ABILIFY) 5 MG tablet Take 1 tablet (5 mg total) by mouth daily. 90 tablet 0   calcium carbonate (TUMS - DOSED IN MG ELEMENTAL CALCIUM) 500 MG chewable tablet Chew 1 tablet by mouth daily as needed for indigestion or heartburn.     cetirizine (ZYRTEC) 10 MG tablet Take 1 tablet (10 mg total) by mouth daily. 30 tablet 11   escitalopram (LEXAPRO) 10 MG tablet Take 1 tablet (10 mg total) by mouth daily. 30 tablet 0   hydrOXYzine (ATARAX/VISTARIL) 10 MG tablet Take 1 tablet (10 mg total) by mouth 2 (two) times daily as needed. May increase to three times a day after 1 week if needed 90 tablet 0   ibuprofen (ADVIL) 600 MG tablet Take 1  tablet (600 mg total) by mouth every 8 (eight) hours as needed. 30 tablet 0   Multiple Vitamin (MULTIVITAMIN WITH MINERALS) TABS tablet Take 1 tablet by mouth daily.     pantoprazole (PROTONIX) 40 MG tablet Take 1 tablet (40 mg total) by mouth 2 (two) times daily. 60 tablet 3   No current facility-administered medications for this visit.    Allergies as of 07/12/2020   (No Known Allergies)    Family History  Problem Relation Age of Onset   COPD Mother    Depression Mother    Cancer Mother        blood cancer   Diabetes Other    Colon cancer Neg Hx     Esophageal cancer Neg Hx    Pancreatic cancer Neg Hx    Stomach cancer Neg Hx     Social History   Socioeconomic History   Marital status: Single    Spouse name: Not on file   Number of children: 0   Years of education: 12   Highest education level: Not on file  Occupational History   Occupation: Security job  Tobacco Use   Smoking status: Never    Passive exposure: Yes   Smokeless tobacco: Never  Vaping Use   Vaping Use: Former  Substance and Sexual Activity   Alcohol use: Yes    Comment: occasional   Drug use: No   Sexual activity: Never    Birth control/protection: None  Other Topics Concern   Not on file  Social History Narrative   Right handed    Lives with mother, step dad, brother (younger), and fiance   Caffeine use: sometimes   Social Determinants of Corporate investment banker Strain: Not on file  Food Insecurity: Food Insecurity Present   Worried About Programme researcher, broadcasting/film/video in the Last Year: Often true   Barista in the Last Year: Sometimes true  Transportation Needs: No Transportation Needs   Lack of Transportation (Medical): No   Lack of Transportation (Non-Medical): No  Physical Activity: Not on file  Stress: Stress Concern Present   Feeling of Stress : Rather much  Social Connections: Not on file  Intimate Partner Violence: Not on file    Review of Systems:    Constitutional: No weight loss, fever or chills Cardiovascular: No chest pain Respiratory: No SOB  Gastrointestinal: See HPI and otherwise negative    Physical Exam:  Vital signs: BP 122/78 (BP Location: Left Arm, Patient Position: Sitting, Cuff Size: Large)   Pulse 100   Ht 5\' 3"  (1.6 m)   Wt (!) 339 lb 6 oz (153.9 kg)   SpO2 98%   BMI 60.12 kg/m   Constitutional:   Pleasant obese Caucasian female appears to be in NAD, Well developed, Well nourished, alert and cooperative Respiratory: Respirations even and unlabored. Lungs clear to auscultation bilaterally.   No wheezes,  crackles, or rhonchi.  Cardiovascular: Normal S1, S2. No MRG. Regular rate and rhythm. No peripheral edema, cyanosis or pallor.  Gastrointestinal:  Soft, nondistended, moderate epigastric ttp, No rebound or guarding. Normal bowel sounds. No appreciable masses or hepatomegaly. Rectal:  Not performed.  Psychiatric: Oriented to person, place and time. Demonstrates good judgement and reason without abnormal affect or behaviors.  RELEVANT LABS AND IMAGING: CBC    Component Value Date/Time   WBC 10.0 04/26/2020 1058   RBC 4.60 04/26/2020 1058   HGB 13.0 04/26/2020 1058   HGB 13.7 12/16/2019 1128   HCT  39.7 04/26/2020 1058   HCT 40.7 12/16/2019 1128   PLT 251 04/26/2020 1058   PLT 292 12/16/2019 1128   MCV 86.3 04/26/2020 1058   MCV 84 12/16/2019 1128   MCH 28.3 04/26/2020 1058   MCHC 32.7 04/26/2020 1058   RDW 13.4 04/26/2020 1058   RDW 12.7 12/16/2019 1128   LYMPHSABS 1.2 09/28/2018 1811   MONOABS 0.6 09/28/2018 1811   EOSABS 0.2 09/28/2018 1811   BASOSABS 0.1 09/28/2018 1811    CMP     Component Value Date/Time   NA 139 12/16/2019 1128   K 4.6 12/16/2019 1128   CL 102 12/16/2019 1128   CO2 20 12/16/2019 1128   GLUCOSE 91 12/16/2019 1128   GLUCOSE 98 09/11/2019 0623   BUN 15 12/16/2019 1128   CREATININE 0.96 04/26/2020 1058   CALCIUM 9.3 12/16/2019 1128   PROT 6.7 09/22/2019 1543   ALBUMIN 4.4 09/22/2019 1543   AST 27 09/22/2019 1543   ALT 46 (H) 09/22/2019 1543   ALKPHOS 69 09/22/2019 1543   BILITOT 0.3 09/22/2019 1543   GFRNONAA >60 04/26/2020 1058   GFRAA 133 12/16/2019 1128    Assessment: 1.  Gastroparesis: Continues with epigastric pain and episodes of nausea and vomiting, has not trialed change in diet 2.  Nausea/vomiting: With above 3.  Esophagitis: Likely from above  Plan: 1.  Discussed diet recommendations including smaller more frequent meals, 5-6 times a day. 2.  Discussed low fiber, provide the patient with a detailed handout regarding this. 3.   Patient to continue other medications including Pantoprazole and Famotidine for now. 4.  We will add Reglan 5 mg 4 times daily, 20-30 minutes before meals and at bedtime.  Discussed possibility of side effects with the patient including involuntary tics.  Told patient if she notices any of these then she needs to stop the medication and let us know.  Prescribed #120 with 3 refills. 5.  Patient to follow in clinic with Dr. Orvan Falconer in 2 months.  Hyacinth Meeker, PA-C [facility] 07/12/2020, 11:05 AM  Cc: Dana Allan, MD

## 2020-07-12 NOTE — Patient Instructions (Signed)
If you are age 21 or older, your body mass index should be between 23-30. Your Body mass index is 60.12 kg/m. If this is out of the aforementioned range listed, please consider follow up with your Primary Care Provider.  If you are age 30 or younger, your body mass index should be between 19-25. Your Body mass index is 60.12 kg/m. If this is out of the aformentioned range listed, please consider follow up with your Primary Care Provider.   We have sent the following medications to your pharmacy for you to pick up at your convenience: Reglan 5 mg   The [place] [facility] providers would like to encourage you to use [facility] to communicate with providers for non-urgent requests or questions.  Due to long hold times on the telephone, sending your provider a message by [facility] may be a faster and more efficient way to get a response.  Please allow 48 business hours for a response.  Please remember that this is for non-urgent requests.   It was a pleasure to see you today!  Thank you for trusting me with your gastrointestinal care!     Hyacinth Meeker, PA-C

## 2020-07-12 NOTE — Progress Notes (Signed)
Reviewed and agree with management plans. Thanks for your help. Do you think she would benefit from meeting with a nutritionist? Dietary management is critical for controlling her symptoms.   Cl[place] Weekly L. Orvan Falconer, MD, MPH

## 2020-07-13 ENCOUNTER — Telehealth: Payer: Self-pay

## 2020-07-13 DIAGNOSIS — K3184 Gastroparesis: Secondary | ICD-10-CM

## 2020-07-13 NOTE — Telephone Encounter (Signed)
Lm on vm for patient to return call.   Ambulatory referral to nutritionist in epic.

## 2020-07-13 NOTE — Telephone Encounter (Signed)
Spoke with patient in regards to recommendations. Advised patient that we have placed the referral and they will contact her directly to set up an appointment. Advised patient to let us know if she has not heard from the nutritionist office within 2 weeks. Patient verbalized understanding and had no concerns at the end of the call.

## 2020-07-13 NOTE — Telephone Encounter (Signed)
-----   Message from Unk Lightning, [place] sent at 07/13/2020  9:14 AM EDT ----- Regarding: referral to nutrition Can you please refer patient to nutritionist for her gastroparesis.  Please call and let her know.  Thanks. JLL ----- Message ----- From: Tressia Danas, MD Sent: 07/12/2020   4:26 PM EDT To: Unk Lightning, PA     ----- Message ----- From: Unk Lightning, [place] Sent: 07/12/2020  11:23 AM EDT To: Tressia Danas, MD

## 2020-07-13 NOTE — Telephone Encounter (Signed)
Inbound call from patient returning nurse's call. °

## 2020-07-15 ENCOUNTER — Other Ambulatory Visit: Payer: Self-pay | Admitting: Family Medicine

## 2020-07-27 DIAGNOSIS — F411 Generalized anxiety disorder: Secondary | ICD-10-CM | POA: Diagnosis not present

## 2020-07-27 NOTE — Patient Instructions (Addendum)
Thank you for coming to see me today. It was a pleasure. Today we talked about:   Continue current medications. Continue to follow up with Therapist regularly   Please follow-up with PCP as needed  If you have any questions or concerns, please do not hesitate to call the office at [phone].  Best,   Dana Allan, MD    [email or web address]  Outpatient Mental Health Providers (No Insurance required or Self Pay)  [facility]  [phone] [email or web address] Marcy Panning and [facility]  [address] [place], [place] [phone] Crisis [phone]  [facility]) can see uninsured folks for outpatient therapy [email or web address] [address] [place], [place] [address] [phone]  [facility]    Walk-in Mon-Fri, 8am-3pm [email or web address] Scull [address], [place], [place]  [phone]   [address]  [place] [phone] [facility] for psych med management, there may be a wait- if [facility] is working with clients for OPT, they will coordinate with [facility] for psych  [facility]   Walk-in-Clinic: Monday- Friday 9:00 AM - 4:00 PM [address]   [place], [place] [phone]  [facility] the [place] ([employer]) walk in M-F 8am-12pm and  1pm-3pm [place]- [address]     [phone]  [employer] -[address]  Phone: [phone]  [employer] (Mental Health and substance challenges) [address]   [place] [phone]    [email or web address]    [address]  [employer] [facility]  [facility] -[address], [place]     Phone:  [phone] [place]-  [address]  [phone]   [place]  [address] [place]  [phone] [email or web address]   [facility] ( virtual or zoom therapy) [email or web address]  [address] [place]  [phone]    [facility] [phone]  grief counseling, dementia and caregiver support    [facility] Walk-in MWF 12:30 to 3:00     [address] [place] [address]  [phone]  [email or web address] call to schedule an appointment     National Alliance on Mental Illness (NAMI) [place]- Wellness classes, Support groups        [address], [place], [place] [address] [phone]   [email or web address]   [facility]  (Psycho-social Rehabilitation clubhouse, Individual and group therapy) [address] [place], [place] [address]   [phone]  24- Hour Availability:  [facility] [phone] or [phone] * [facility] the [employer] (Domestic Violence, Rape, etc. )[phone] Vesta Mixer [phone] or [phone] * [facility] [place] [facility] [phone] only) [phone] (after hours) *[facility] Mobile Crisis Unit [phone] *[place] National Suicide Hotline [phone] Len Childs)

## 2020-07-27 NOTE — Progress Notes (Signed)
    SUBJECTIVE:   CHIEF COMPLAINT / HPI:   Presents for follow up for anxiety and depression. Seen in clinic on 05/23 and increased Atarax dosing to 10 mg TID from BID and continued same dose of Lexapro 10 mg daily. Tolerating medication well.  Not wanting to increase Lexapro at this time.  Taking Atarax in the morning and sometime in the afternoon.  Since then patient reports small improvement in symptoms. 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  She sometimes still has some anxiety given new surroundings but is managing for now. Denies any SI/HI.  Has not been able to set up appointment with psychiatry yet.  Has not been able to follow up with Therapist.  She recently started Reglan for gastroparesis by GI and is tolerating medication well so far.  Associated symptoms include none.   PERTINENT  PMH / PSH:  Anxiety/Depression Gastroparesis  OBJECTIVE:   BP 124/82   Pulse (!) 105   Ht 5\' 3"  (1.6 m)   Wt (!) 339 lb 6.4 oz (154 kg)   SpO2 98%   BMI 60.12 kg/m    General: Alert, no acute distress Cardio: Normal S1 and S2, RRR, no r/m/g Pulm: CTAB, normal work of breathing Psych: Mood good, makes good eye contact, speech normal.  Continues to have rapid knee shaking.   ASSESSMENT/PLAN:   Anxiety with depression PHQ9 (5), essentially unchanged from last visit. GAD (7) has decreased from last visit (12).  Now has a job and managing ok.  Denies any SI/HI -Will have CMA provide number for patient to call to schedule appointment at [facility] for pstch evaluation. -Mental health resources provided, patient to follow up with finding new Therapist -24hr crisis line provided -Continue Lexapro 10 mg daily -Continue Atarax 10 mg TID as needed -Follow up in 4 weeks or sooner if needed    ADVENTIST GLENOAKS, MD [facility]

## 2020-07-28 ENCOUNTER — Ambulatory Visit (INDEPENDENT_AMBULATORY_CARE_PROVIDER_SITE_OTHER): Payer: Medicaid Other | Admitting: Family Medicine

## 2020-07-28 ENCOUNTER — Encounter: Payer: Self-pay | Admitting: Family Medicine

## 2020-07-28 ENCOUNTER — Other Ambulatory Visit: Payer: Self-pay

## 2020-07-28 VITALS — BP 124/82 | HR 105 | Ht 63.0 in | Wt 339.4 lb

## 2020-07-28 DIAGNOSIS — F418 Other specified anxiety disorders: Secondary | ICD-10-CM

## 2020-07-29 ENCOUNTER — Encounter: Payer: Self-pay | Admitting: Family Medicine

## 2020-07-29 DIAGNOSIS — Z419 Encounter for procedure for purposes other than remedying health state, unspecified: Secondary | ICD-10-CM | POA: Diagnosis not present

## 2020-07-29 NOTE — Assessment & Plan Note (Signed)
PHQ9 (5), essentially unchanged from last visit. GAD (7) has decreased from last visit (12).  Now has a job and managing ok.  Denies any SI/HI -Will have CMA provide number for patient to call to schedule appointment at [facility] for pstch evaluation. -Mental health resources provided, patient to follow up with finding new Therapist -24hr crisis line provided -Continue Lexapro 10 mg daily -Continue Atarax 10 mg TID as needed -Follow up in 4 weeks or sooner if needed

## 2020-08-03 ENCOUNTER — Encounter: Payer: Self-pay | Admitting: Emergency Medicine

## 2020-08-03 ENCOUNTER — Other Ambulatory Visit: Payer: Self-pay

## 2020-08-03 ENCOUNTER — Ambulatory Visit
Admission: EM | Admit: 2020-08-03 | Discharge: 2020-08-03 | Disposition: A | Discharge status: Home / Self Care | Payer: Medicaid Other | Attending: Family Medicine | Admitting: Family Medicine

## 2020-08-03 DIAGNOSIS — N390 Urinary tract infection, site not specified: Secondary | ICD-10-CM | POA: Diagnosis not present

## 2020-08-03 LAB — POCT URINALYSIS DIP (MANUAL ENTRY)
Bilirubin, UA: NEGATIVE
Glucose, UA: NEGATIVE mg/dL
Ketones, POC UA: NEGATIVE mg/dL
Nitrite, UA: NEGATIVE
Spec Grav, UA: >=1.03 — AB (ref 1.010–1.025)
Urobilinogen, UA: 0.2 E.U./dL
pH, UA: 5.5 (ref 5.0–8.0)

## 2020-08-03 MED ORDER — PHENAZOPYRIDINE HCL 200 MG PO TABS: 200.0000 mg | ORAL_TABLET | Freq: Three times a day (TID) | ORAL | 0 refills | Status: DC

## 2020-08-03 MED ORDER — SULFAMETHOXAZOLE-TRIMETHOPRIM 800-160 MG PO TABS: 1.0000 | ORAL_TABLET | Freq: Two times a day (BID) | ORAL | 0 refills | Status: DC

## 2020-08-03 MED ORDER — CEFTRIAXONE SODIUM 500 MG IJ SOLR
500.0000 mg | Freq: Once | INTRAMUSCULAR | Status: AC
  Administered 2020-08-03: 500 mg via INTRAMUSCULAR

## 2020-08-03 NOTE — ED Provider Notes (Signed)
[facility]    CSN: [id number] Arrival date & time: 08/03/20  1220      History   Chief Complaint Chief Complaint  Patient presents with   Flank Pain   Abdominal Pain    HPI Katelyn Aguilar is a 20 y.o. female.   Patient presenting today with 2 to 3-day history of worsening bilateral lower abdominal pain, bilateral flank pain, dysuria, urinary frequency and urgency, nausea.  Denies fever, chills, hematuria, vaginal discharge or pelvic pain, concern for STI or pregnancy.  So far trying cranberry juice and drinking more water with minimal relief.  History of urinary tract infections at a felt similar.  States she is actively on Depo, took a pregnancy test yesterday that was negative and has no concern for pregnancy.   Past Medical History:  Diagnosis Date   Allergic rhinitis    Anxiety    Dysrhythmia    tachycardia   GERD (gastroesophageal reflux disease)    Obesity     Patient Active Problem List   Diagnosis Date Noted   Pain of left breast 06/26/2020   BMI 60.0-69.9, adult (HCC) 05/19/2020   Contraception management 05/19/2020   Anxiety with depression 05/09/2020   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/26/2020   Pilonidal cyst without abscess 04/18/2020   Tachycardia 02/28/2020   Fatigue 02/28/2020   Screen for STD (sexually transmitted disease) 01/12/2020   Irregular menses 12/04/2019   Pilonidal abscess 12/04/2019   Pyelonephritis 09/15/2019   Dizziness 04/22/2019   Cramp in limb 04/10/2019   Nausea and vomiting 04/06/2019   Simple tics 11/17/2018   Anxiety 11/17/2018   Involuntary movements 10/21/2018   Encounter for birth control 10/21/2018    Past Surgical History:  Procedure Laterality Date   BIOPSY  04/19/2020   Procedure: BIOPSY;  Surgeon: Tressia Danas, MD;  Location: [facility];  Service: Gastroenterology;;   ESOPHAGOGASTRODUODENOSCOPY     ESOPHAGOGASTRODUODENOSCOPY (EGD) WITH PROPOFOL N/A 04/19/2020   Procedure:  ESOPHAGOGASTRODUODENOSCOPY (EGD) WITH PROPOFOL;  Surgeon: Tressia Danas, MD;  Location: [facility];  Service: Gastroenterology;  Laterality: N/A;   INCISION AND DRAINAGE ABSCESS N/A 04/26/2020   Procedure: excision of pilonidal cyst with open packing;  Surgeon: Darnell Level, MD;  Location: [facility];  Service: General;  Laterality: N/A;   NO PAST SURGERIES      OB History   No obstetric history on file.      Home Medications    Prior to Admission medications   Medication Sig Start Date End Date Taking? Authorizing Provider  ARIPiprazole (ABILIFY) 5 MG tablet Take 1 tablet (5 mg total) by mouth daily. 05/03/20  Yes Levert Feinstein, MD  cetirizine (ZYRTEC) 10 MG tablet Take 1 tablet (10 mg total) by mouth daily. 11/10/19  Yes Dana Allan, MD  escitalopram (LEXAPRO) 10 MG tablet TAKE 1 TABLET(10 MG) BY MOUTH DAILY 07/15/20  Yes Dana Allan, MD  hydrOXYzine (ATARAX/VISTARIL) 10 MG tablet Take 1 tablet (10 mg total) by mouth 2 (two) times daily as needed. May increase to three times a day after 1 week if needed 05/17/20  Yes Dana Allan, MD  pantoprazole (PROTONIX) 40 MG tablet Take 1 tablet (40 mg total) by mouth 2 (two) times daily. 03/09/20  Yes Tressia Danas, MD  phenazopyridine (PYRIDIUM) 200 MG tablet Take 1 tablet (200 mg total) by mouth 3 (three) times daily. 08/03/20  Yes Particia Nearing, PA-C  sulfamethoxazole-trimethoprim (BACTRIM DS) 800-160 MG tablet Take 1 tablet by mouth 2 (two) times daily. 08/03/20  Yes Particia Nearing, PA-C  acetaminophen (TYLENOL) 325 MG tablet Take 650 mg by mouth every 6 (six) hours as needed for moderate pain.    [provider]  albuterol (VENTOLIN HFA) 108 (90 Base) MCG/ACT inhaler Inhale 1-2 puffs into the lungs every 6 (six) hours as needed for wheezing or shortness of breath.    [provider]  calcium carbonate (TUMS - DOSED IN MG ELEMENTAL CALCIUM) 500 MG chewable tablet Chew 1 tablet by mouth daily as needed for indigestion  or heartburn.    [provider]  ibuprofen (ADVIL) 600 MG tablet Take 1 tablet (600 mg total) by mouth every 8 (eight) hours as needed. 06/20/20   Dana Allan, MD  metoCLOPramide (REGLAN) 5 MG tablet Take 1 tablet (5 mg total) by mouth 4 (four) times daily. Take one tablet 20-30 minutes before meals and at bedtime. 07/12/20   Unk Lightning, PA  Multiple Vitamin (MULTIVITAMIN WITH MINERALS) TABS tablet Take 1 tablet by mouth daily.    [provider]    Family History Family History  Problem Relation Age of Onset   COPD Mother    Depression Mother    Cancer Mother        blood cancer   Diabetes Other    Colon cancer Neg Hx    Esophageal cancer Neg Hx    Pancreatic cancer Neg Hx    Stomach cancer Neg Hx     Social History Social History   Tobacco Use   Smoking status: Never    Passive exposure: Yes   Smokeless tobacco: Never  Vaping Use   Vaping Use: Former  Substance Use Topics   Alcohol use: Yes    Comment: occasional   Drug use: No     Allergies   Patient has no known allergies.   Review of Systems Review of Systems PER HPI  Physical Exam Triage Vital Signs ED Triage Vitals  Enc Vitals Group     BP 08/03/20 1257 125/85     Pulse Rate 08/03/20 1257 (!) 102     Resp 08/03/20 1257 15     Temp 08/03/20 1257 98.3 F (36.8 C)     Temp Source 08/03/20 1257 Oral     SpO2 08/03/20 1257 98 %     Weight --      Height --      Head Circumference --      Peak Flow --      Pain Score 08/03/20 1253 6     Pain Loc --      Pain Edu? --      Excl. in GC? --    No data found.  Updated Vital Signs BP 125/85 (BP Location: Left Arm)   Pulse (!) 102   Temp 98.3 F (36.8 C) (Oral)   Resp 15   LMP 06/23/2020 (Approximate)   SpO2 98%   Visual Acuity Right Eye Distance:   Left Eye Distance:   Bilateral Distance:    Right Eye Near:   Left Eye Near:    Bilateral Near:     Physical Exam Vitals and nursing note reviewed.   Constitutional:      Appearance: Normal appearance. She is not ill-appearing.  HENT:     Head: Atraumatic.     Mouth/Throat:     Mouth: Mucous membranes are moist.     Pharynx: Oropharynx is clear.  Eyes:     Extraocular Movements: Extraocular movements intact.     Conjunctiva/sclera:  Conjunctivae normal.  Cardiovascular:     Rate and Rhythm: Normal rate and regular rhythm.     Heart sounds: Normal heart sounds.  Pulmonary:     Effort: Pulmonary effort is normal. No respiratory distress.     Breath sounds: Normal breath sounds. No wheezing.  Abdominal:     General: Bowel sounds are normal. There is no distension.     Palpations: Abdomen is soft.     Tenderness: There is abdominal tenderness. There is no right CVA tenderness, left CVA tenderness or guarding.     Comments: Mild bilateral lower abdominal tenderness palpation without guarding  Musculoskeletal:        General: Normal range of motion.     Cervical back: Normal range of motion and neck supple.  Skin:    General: Skin is warm and dry.  Neurological:     Mental Status: She is alert and oriented to person, place, and time.     Motor: No weakness.     Gait: Gait normal.  Psychiatric:        Mood and Affect: Mood normal.        Thought Content: Thought content normal.        Judgment: Judgment normal.   UC Treatments / Results  Labs (all labs ordered are listed, but only abnormal results are displayed) Labs Reviewed  POCT URINALYSIS DIP (MANUAL ENTRY) - Abnormal; Notable for the following components:      Result Value   Clarity, UA cloudy (*)    Spec Grav, UA >=1.030 (*)    Blood, UA large (*)    Protein Ur, POC trace (*)    Leukocytes, UA Small (1+) (*)    All other components within normal limits  URINE CULTURE    EKG   Radiology No results found.  Procedures Procedures (including critical care time)  Medications Ordered in UC Medications  cefTRIAXone (ROCEPHIN) injection 500 mg (500 mg  Intramuscular Given 08/03/20 1337)    Initial Impression / Assessment and Plan / UC Course  I have reviewed the triage vital signs and the nursing notes.  Pertinent labs & imaging results that were available during my care of the patient were reviewed by me and considered in my medical decision making (see chart for details).     Mildly tachycardic to the rise vital signs very reassuring today, exam overall reassuring with some generalized lower abdominal tenderness palpation without guarding.  She declines urine pregnancy or STI testing today as she has no suspicion for either.  UA showing evidence of urinary tract infection, urine culture pending and will start Bactrim, Pyridium while awaiting culture results.  IM Rocephin given in clinic as she states she usually requires this as well as her urinary tract infections usually get bad quickly.  Return precautions reviewed for acutely worsening symptoms.  Final Clinical Impressions(s) / UC Diagnoses   Final diagnoses:  Acute lower UTI   Discharge Instructions   None    ED Prescriptions     Medication Sig Dispense Auth. Provider   sulfamethoxazole-trimethoprim (BACTRIM DS) 800-160 MG tablet Take 1 tablet by mouth 2 (two) times daily. 10 tablet Particia Nearing, [place]   phenazopyridine (PYRIDIUM) 200 MG tablet Take 1 tablet (200 mg total) by mouth 3 (three) times daily. 6 tablet Particia Nearing, [place]      PDMP not reviewed this encounter.   Particia Nearing, [place] 08/03/20 1346

## 2020-08-03 NOTE — ED Triage Notes (Addendum)
Patient c/o bilateral flank pain and mid lower ABD pain x 2 days.   Patient denies dysuria, fever, foul smell and hematuria.   Patient endorses nausea. Patient endorses a "brownish" vaginal discharge.   Patient denies diarrhea and emesis.   History of UTI.   Patient attempted to use cranberry juice with no relief of symptoms.

## 2020-08-04 ENCOUNTER — Encounter (HOSPITAL_COMMUNITY): Payer: Self-pay

## 2020-08-04 ENCOUNTER — Other Ambulatory Visit: Payer: Self-pay

## 2020-08-04 ENCOUNTER — Telehealth: Payer: Self-pay | Admitting: Physician Assistant

## 2020-08-04 ENCOUNTER — Emergency Department (HOSPITAL_COMMUNITY)
Admission: EM | Admit: 2020-08-04 | Discharge: 2020-08-05 | Disposition: A | Discharge status: Home / Self Care | Payer: Medicaid Other | Attending: Emergency Medicine | Admitting: Emergency Medicine

## 2020-08-04 ENCOUNTER — Telehealth: Payer: Medicaid Other | Admitting: Family Medicine

## 2020-08-04 DIAGNOSIS — N888 Other specified noninflammatory disorders of cervix uteri: Secondary | ICD-10-CM | POA: Diagnosis not present

## 2020-08-04 DIAGNOSIS — Z87891 Personal history of nicotine dependence: Secondary | ICD-10-CM | POA: Diagnosis not present

## 2020-08-04 DIAGNOSIS — M545 Low back pain, unspecified: Secondary | ICD-10-CM | POA: Diagnosis not present

## 2020-08-04 DIAGNOSIS — R102 Pelvic and perineal pain: Secondary | ICD-10-CM | POA: Diagnosis not present

## 2020-08-04 DIAGNOSIS — K760 Fatty (change of) liver, not elsewhere classified: Secondary | ICD-10-CM | POA: Diagnosis not present

## 2020-08-04 DIAGNOSIS — K219 Gastro-esophageal reflux disease without esophagitis: Secondary | ICD-10-CM | POA: Insufficient documentation

## 2020-08-04 DIAGNOSIS — R109 Unspecified abdominal pain: Secondary | ICD-10-CM | POA: Diagnosis not present

## 2020-08-04 DIAGNOSIS — N390 Urinary tract infection, site not specified: Secondary | ICD-10-CM

## 2020-08-04 LAB — CBC WITH DIFFERENTIAL/PLATELET
Abs Immature Granulocytes: 0.06 10*3/uL (ref 0.00–0.07)
Basophils Absolute: 0.1 10*3/uL (ref 0.0–0.1)
Basophils Relative: 1 %
Eosinophils Absolute: 0.3 10*3/uL (ref 0.0–0.5)
Eosinophils Relative: 3 %
HCT: 41.9 % (ref 36.0–46.0)
Hemoglobin: 13.2 g/dL (ref 12.0–15.0)
Immature Granulocytes: 1 %
Lymphocytes Relative: 24 %
Lymphs Abs: 2.6 10*3/uL (ref 0.7–4.0)
MCH: 27.1 pg (ref 26.0–34.0)
MCHC: 31.5 g/dL (ref 30.0–36.0)
MCV: 86 fL (ref 80.0–100.0)
Monocytes Absolute: 1 10*3/uL (ref 0.1–1.0)
Monocytes Relative: 10 %
Neutro Abs: 6.6 10*3/uL (ref 1.7–7.7)
Neutrophils Relative %: 61 %
Platelets: 289 10*3/uL (ref 150–400)
RBC: 4.87 MIL/uL (ref 3.87–5.11)
RDW: 13.5 % (ref 11.5–15.5)
WBC: 10.6 10*3/uL — ABNORMAL HIGH (ref 4.0–10.5)
nRBC: 0 % (ref 0.0–0.2)

## 2020-08-04 MED ORDER — MORPHINE SULFATE (PF) 4 MG/ML IV SOLN
4.0000 mg | Freq: Once | INTRAVENOUS | Status: AC
  Administered 2020-08-04: 4 mg via INTRAVENOUS
  Filled 2020-08-04: qty 1

## 2020-08-04 MED ORDER — ONDANSETRON HCL 4 MG/2ML IJ SOLN
4.0000 mg | Freq: Once | INTRAMUSCULAR | Status: AC
  Administered 2020-08-04: 4 mg via INTRAVENOUS
  Filled 2020-08-04: qty 2

## 2020-08-04 MED ORDER — SODIUM CHLORIDE 0.9 % IV BOLUS
1000.0000 mL | Freq: Once | INTRAVENOUS | Status: AC
  Administered 2020-08-04: 1000 mL via INTRAVENOUS

## 2020-08-04 MED ORDER — SODIUM CHLORIDE 0.9 % IV SOLN
1.0000 g | Freq: Once | INTRAVENOUS | Status: AC
  Administered 2020-08-04: 1 g via INTRAVENOUS
  Filled 2020-08-04: qty 10

## 2020-08-04 MED ORDER — METOCLOPRAMIDE HCL 10 MG PO TABS: 10.0000 mg | ORAL_TABLET | Freq: Four times a day (QID) | ORAL | 3 refills | Status: DC

## 2020-08-04 NOTE — ED Triage Notes (Signed)
Pt reports lower abdominal pain that has radiated to bilateral flank. Pt reports worsening pain x 2 days. Seen at [facility] yesterday and dx with UTI. Given RX bactrim and rocephin injection.

## 2020-08-04 NOTE — ED Provider Notes (Signed)
[facility]-EMERGENCY DEPT Provider Note   CSN: [id number] Arrival date & time: 08/04/20  1814     History Chief Complaint  Patient presents with   Flank Pain    Katelyn Aguilar is a 20 y.o. female.  Patient with lower abdominal pain, bilateral flank pain, nausea, urinary frequency and urgency for the past 4 days.  She was seen in urgent care yesterday and diagnosed with a UTI.  She was prescribed Bactrim and given a Rocephin shot.  She had a video visit today was told to come back to the ED because she had worsening pain as well as nausea.  States she has chronic vomiting due to gastric emptying problems with did not have any vomiting today.  Denies fevers, chills.  States she normally does not get dysuria or hematuria with her UTIs.  Denies any vaginal discharge but does have some irregular vaginal bleeding as her birth control was recently changed.  No history of kidney stones.  Reports her pain is worse when she was seen at urgent care.  There is no difficulty breathing or chest pain.  No cough, runny nose or sore throat.  No fever.  Patient was medicated UTI symptoms she has UTI.  Urine dip was not overly impressive for infection but did show blood and leukocyte esterase.  Culture is pending.  The history is provided by the patient.  Flank Pain Associated symptoms include abdominal pain. Pertinent negatives include no chest pain, no headaches and no shortness of breath.      Past Medical History:  Diagnosis Date   Allergic rhinitis    Anxiety    Dysrhythmia    tachycardia   GERD (gastroesophageal reflux disease)    Obesity     Patient Active Problem List   Diagnosis Date Noted   Pain of left breast 06/26/2020   BMI 60.0-69.9, adult (HCC) 05/19/2020   Contraception management 05/19/2020   Anxiety with depression 05/09/2020   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/26/2020   Pilonidal cyst without abscess 04/18/2020   Tachycardia 02/28/2020   Fatigue  02/28/2020   Screen for STD (sexually transmitted disease) 01/12/2020   Irregular menses 12/04/2019   Pilonidal abscess 12/04/2019   Pyelonephritis 09/15/2019   Dizziness 04/22/2019   Cramp in limb 04/10/2019   Nausea and vomiting 04/06/2019   Simple tics 11/17/2018   Anxiety 11/17/2018   Involuntary movements 10/21/2018   Encounter for birth control 10/21/2018    Past Surgical History:  Procedure Laterality Date   BIOPSY  04/19/2020   Procedure: BIOPSY;  Surgeon: Tressia DanasBeavers, Kimberly, MD;  Location: [facility];  Service: Gastroenterology;;   ESOPHAGOGASTRODUODENOSCOPY     ESOPHAGOGASTRODUODENOSCOPY (EGD) WITH PROPOFOL N/A 04/19/2020   Procedure: ESOPHAGOGASTRODUODENOSCOPY (EGD) WITH PROPOFOL;  Surgeon: Tressia DanasBeavers, Kimberly, MD;  Location: [facility];  Service: Gastroenterology;  Laterality: N/A;   INCISION AND DRAINAGE ABSCESS N/A 04/26/2020   Procedure: excision of pilonidal cyst with open packing;  Surgeon: Darnell LevelGerkin, Todd, MD;  Location: [facility];  Service: General;  Laterality: N/A;   NO PAST SURGERIES       OB History   No obstetric history on file.     Family History  Problem Relation Age of Onset   COPD Mother    Depression Mother    Cancer Mother        blood cancer   Diabetes Other    Colon cancer Neg Hx    Esophageal cancer Neg Hx    Pancreatic cancer Neg Hx  Stomach cancer Neg Hx     Social History   Tobacco Use   Smoking status: Never    Passive exposure: Yes   Smokeless tobacco: Never  Vaping Use   Vaping Use: Former  Substance Use Topics   Alcohol use: Yes    Comment: occasional   Drug use: No    Home Medications Prior to Admission medications   Medication Sig Start Date End Date Taking? Authorizing Provider  acetaminophen (TYLENOL) 325 MG tablet Take 650 mg by mouth every 6 (six) hours as needed for moderate pain.    [provider]  albuterol (VENTOLIN HFA) 108 (90 Base) MCG/ACT inhaler Inhale 1-2 puffs into the lungs every 6 (six) hours  as needed for wheezing or shortness of breath.    [provider]  ARIPiprazole (ABILIFY) 5 MG tablet Take 1 tablet (5 mg total) by mouth daily. 05/03/20   Levert Feinstein, MD  calcium carbonate (TUMS - DOSED IN MG ELEMENTAL CALCIUM) 500 MG chewable tablet Chew 1 tablet by mouth daily as needed for indigestion or heartburn.    [provider]  cetirizine (ZYRTEC) 10 MG tablet Take 1 tablet (10 mg total) by mouth daily. 11/10/19   Dana Allan, MD  escitalopram (LEXAPRO) 10 MG tablet TAKE 1 TABLET(10 MG) BY MOUTH DAILY 07/15/20   Dana Allan, MD  hydrOXYzine (ATARAX/VISTARIL) 10 MG tablet Take 1 tablet (10 mg total) by mouth 2 (two) times daily as needed. May increase to three times a day after 1 week if needed 05/17/20   Dana Allan, MD  ibuprofen (ADVIL) 600 MG tablet Take 1 tablet (600 mg total) by mouth every 8 (eight) hours as needed. 06/20/20   Dana Allan, MD  metoCLOPramide (REGLAN) 10 MG tablet Take 1 tablet (10 mg total) by mouth 4 (four) times daily. 08/04/20   Unk Lightning, PA  Multiple Vitamin (MULTIVITAMIN WITH MINERALS) TABS tablet Take 1 tablet by mouth daily.    [provider]  pantoprazole (PROTONIX) 40 MG tablet Take 1 tablet (40 mg total) by mouth 2 (two) times daily. 03/09/20   Tressia Danas, MD  phenazopyridine (PYRIDIUM) 200 MG tablet Take 1 tablet (200 mg total) by mouth 3 (three) times daily. 08/03/20   Particia Nearing, PA-C  sulfamethoxazole-trimethoprim (BACTRIM DS) 800-160 MG tablet Take 1 tablet by mouth 2 (two) times daily. 08/03/20   Particia Nearing, PA-C    Allergies    Patient has no known allergies.  Review of Systems   Review of Systems  Constitutional:  Positive for activity change and appetite change. Negative for fatigue and fever.  HENT:  Negative for congestion and rhinorrhea.   Eyes:  Negative for visual disturbance.  Respiratory:  Negative for cough, chest tightness and shortness of breath.   Cardiovascular:   Negative for chest pain.  Gastrointestinal:  Positive for abdominal pain and nausea. Negative for vomiting.  Genitourinary:  Positive for dysuria, flank pain and vaginal bleeding. Negative for hematuria.  Musculoskeletal:  Positive for back pain.  Skin:  Negative for wound.  Neurological:  Negative for dizziness, weakness and headaches.   all other systems are negative except as noted in the HPI and PMH.    Physical Exam Updated Vital Signs BP (!) 154/88 (BP Location: Right Arm)   Pulse 96   Temp 99.2 F (37.3 C) (Oral)   Resp 18   Ht 5\' 3"  (1.6 m)   Wt (!) 153.8 kg   SpO2 100%   BMI 60.05 kg/m  Physical Exam Vitals and nursing note reviewed.  Constitutional:      General: She is not in acute distress.    Appearance: She is well-developed. She is not ill-appearing.  HENT:     Head: Normocephalic and atraumatic.     Mouth/Throat:     Pharynx: No oropharyngeal exudate.  Eyes:     Conjunctiva/sclera: Conjunctivae normal.     Pupils: Pupils are equal, round, and reactive to light.  Neck:     Comments: No meningismus. Cardiovascular:     Rate and Rhythm: Normal rate and regular rhythm.     Heart sounds: Normal heart sounds. No murmur heard. Pulmonary:     Effort: Pulmonary effort is normal. No respiratory distress.     Breath sounds: Normal breath sounds.  Chest:     Chest wall: No tenderness.  Abdominal:     Palpations: Abdomen is soft.     Tenderness: There is abdominal tenderness. There is no guarding or rebound.     Comments: Diffuse tenderness throughout lower abdomen, no guarding or rebound.  Genitourinary:    Comments: Tobi Bastos RN present. Normal external genitalia.  White discharge in vaginal vault with scant bleeding.  Diffuse tenderness to palpation of pelvic area without discrete adnexal tenderness or CMT tenderness Musculoskeletal:        General: Tenderness present. Normal range of motion.     Cervical back: Normal range of motion and neck supple.      Comments: Paraspinal lumbar tenderness bilaterally  Skin:    General: Skin is warm.  Neurological:     Mental Status: She is alert and oriented to person, place, and time.     Cranial Nerves: No cranial nerve deficit.     Motor: No abnormal muscle tone.     Coordination: Coordination normal.     Comments:  5/5 strength throughout. CN 2-12 intact.Equal grip strength.   Psychiatric:        Behavior: Behavior normal.    ED Results / Procedures / Treatments   Labs (all labs ordered are listed, but only abnormal results are displayed) Labs Reviewed  WET PREP, GENITAL - Abnormal; Notable for the following components:      Result Value   Clue Cells Wet Prep HPF POC PRESENT (*)    WBC, Wet Prep HPF POC PRESENT (*)    All other components within normal limits  URINALYSIS, ROUTINE W REFLEX MICROSCOPIC - Abnormal; Notable for the following components:   APPearance HAZY (*)    Specific Gravity, Urine >1.030 (*)    Hgb urine dipstick LARGE (*)    Protein, ur TRACE (*)    Leukocytes,Ua TRACE (*)    All other components within normal limits  CBC WITH DIFFERENTIAL/PLATELET - Abnormal; Notable for the following components:   WBC 10.6 (*)    All other components within normal limits  URINALYSIS, MICROSCOPIC (REFLEX) - Abnormal; Notable for the following components:   Bacteria, UA RARE (*)    All other components within normal limits  URINE CULTURE  PREGNANCY, URINE  COMPREHENSIVE METABOLIC PANEL  LIPASE, BLOOD  GC/CHLAMYDIA PROBE AMP ([place]) NOT AT [facility]    EKG None  Radiology US Transvaginal Non-OB  Result Date: 08/05/2020 CLINICAL DATA:  Pelvic pain.  Started Depo 6 months ago. EXAM: TRANSABDOMINAL AND TRANSVAGINAL ULTRASOUND OF PELVIS DOPPLER ULTRASOUND OF OVARIES TECHNIQUE: Both transabdominal and transvaginal ultrasound examinations of the pelvis were performed. Transabdominal technique was performed for global imaging of the pelvis including uterus, ovaries, adnexal regions,  and pelvic cul-de-sac. It was necessary to proceed with endovaginal exam following the transabdominal exam to visualize the ovaries and endometrium. Color and duplex Doppler ultrasound was utilized to evaluate blood flow to the ovaries. COMPARISON:  CT 08/05/2020 FINDINGS: Uterus Measurements: 6.9 x 3.5 x 4.2 cm = volume: 53 mL. Uterus is anteverted. No myometrial mass lesions identified. Small nabothian cysts in the cervix. Endometrium Thickness: 7 mm.  No focal abnormality visualized. Right ovary Right ovary is not visualized. Left ovary Measurements: 2.4 x 1.5 x 1.5 cm = volume: 3 mL. Limited visualization, only seen transabdominally. No abnormality suggested. Pulsed Doppler evaluation of the visualized left ovary demonstrates normal low-resistance arterial and venous waveforms. Other findings No abnormal free fluid. IMPRESSION: Limited examination. Right ovary is not visualized. Uterus and visualized left ovary are unremarkable. Electronically Signed   By: Burman Nieves M.D.   On: 08/05/2020 01:51   US Pelvis Complete  Result Date: 08/05/2020 CLINICAL DATA:  Pelvic pain.  Started Depo 6 months ago. EXAM: TRANSABDOMINAL AND TRANSVAGINAL ULTRASOUND OF PELVIS DOPPLER ULTRASOUND OF OVARIES TECHNIQUE: Both transabdominal and transvaginal ultrasound examinations of the pelvis were performed. Transabdominal technique was performed for global imaging of the pelvis including uterus, ovaries, adnexal regions, and pelvic cul-de-sac. It was necessary to proceed with endovaginal exam following the transabdominal exam to visualize the ovaries and endometrium. Color and duplex Doppler ultrasound was utilized to evaluate blood flow to the ovaries. COMPARISON:  CT 08/05/2020 FINDINGS: Uterus Measurements: 6.9 x 3.5 x 4.2 cm = volume: 53 mL. Uterus is anteverted. No myometrial mass lesions identified. Small nabothian cysts in the cervix. Endometrium Thickness: 7 mm.  No focal abnormality visualized. Right ovary Right ovary  is not visualized. Left ovary Measurements: 2.4 x 1.5 x 1.5 cm = volume: 3 mL. Limited visualization, only seen transabdominally. No abnormality suggested. Pulsed Doppler evaluation of the visualized left ovary demonstrates normal low-resistance arterial and venous waveforms. Other findings No abnormal free fluid. IMPRESSION: Limited examination. Right ovary is not visualized. Uterus and visualized left ovary are unremarkable. Electronically Signed   By: Burman Nieves M.D.   On: 08/05/2020 01:51   [place] Art/Ven Flow Abd Pelv Doppler  Result Date: 08/05/2020 CLINICAL DATA:  Pelvic pain.  Started Depo 6 months ago. EXAM: TRANSABDOMINAL AND TRANSVAGINAL ULTRASOUND OF PELVIS DOPPLER ULTRASOUND OF OVARIES TECHNIQUE: Both transabdominal and transvaginal ultrasound examinations of the pelvis were performed. Transabdominal technique was performed for global imaging of the pelvis including uterus, ovaries, adnexal regions, and pelvic cul-de-sac. It was necessary to proceed with endovaginal exam following the transabdominal exam to visualize the ovaries and endometrium. Color and duplex Doppler ultrasound was utilized to evaluate blood flow to the ovaries. COMPARISON:  CT 08/05/2020 FINDINGS: Uterus Measurements: 6.9 x 3.5 x 4.2 cm = volume: 53 mL. Uterus is anteverted. No myometrial mass lesions identified. Small nabothian cysts in the cervix. Endometrium Thickness: 7 mm.  No focal abnormality visualized. Right ovary Right ovary is not visualized. Left ovary Measurements: 2.4 x 1.5 x 1.5 cm = volume: 3 mL. Limited visualization, only seen transabdominally. No abnormality suggested. Pulsed Doppler evaluation of the visualized left ovary demonstrates normal low-resistance arterial and venous waveforms. Other findings No abnormal free fluid. IMPRESSION: Limited examination. Right ovary is not visualized. Uterus and visualized left ovary are unremarkable. Electronically Signed   By: Burman Nieves M.D.   On: 08/05/2020  01:51   CT Renal Stone Study  Result Date: 08/05/2020 CLINICAL DATA:  Bilateral flank pain for 2 days. History  of urinary tract infection EXAM: CT ABDOMEN AND PELVIS WITHOUT CONTRAST TECHNIQUE: Multidetector CT imaging of the abdomen and pelvis was performed following the standard protocol without IV contrast. COMPARISON:  None. FINDINGS: Lower chest: Lung bases are clear. Hepatobiliary: Diffuse fatty infiltration of the liver. No focal lesions. Gallbladder and bile ducts are unremarkable. Pancreas: Unremarkable. No pancreatic ductal dilatation or surrounding inflammatory changes. Spleen: Normal in size without focal abnormality. Adrenals/Urinary Tract: Adrenal glands are unremarkable. Kidneys are normal, without renal calculi, focal lesion, or hydronephrosis. Bladder is unremarkable. Stomach/Bowel: Stomach is within normal limits. Appendix appears normal. No evidence of bowel wall thickening, distention, or inflammatory changes. Vascular/Lymphatic: No significant vascular findings are present. No enlarged abdominal or pelvic lymph nodes. Reproductive: Uterus and bilateral adnexa are unremarkable. Other: No abdominal wall hernia or abnormality. No abdominopelvic ascites. Musculoskeletal: No acute or significant osseous findings. IMPRESSION: 1. No renal or ureteral stone or obstruction. 2. Diffuse fatty infiltration of the liver. Electronically Signed   By: Burman Nieves M.D.   On: 08/05/2020 00:39    Procedures Procedures   Medications Ordered in ED Medications  sodium chloride 0.9 % bolus 1,000 mL (has no administration in time range)  ondansetron (ZOFRAN) injection 4 mg (has no administration in time range)  cefTRIAXone (ROCEPHIN) 1 g in sodium chloride 0.9 % 100 mL IVPB (has no administration in time range)  morphine 4 MG/ML injection 4 mg (has no administration in time range)    ED Course  I have reviewed the triage vital signs and the nursing notes.  Pertinent labs & imaging results that  were available during my care of the patient were reviewed by me and considered in my medical decision making (see chart for details).    MDM Rules/Calculators/A&P                         Abdominal pain and flank pain with recent diagnosis of UTI 2 days ago.  Urine culture pending.  With worsening pain and nausea.  Appears well, no distress  Urinalysis has blood but no other obvious infection.  Culture is sent.  CT scan shows no kidney stone or other acute pathology.  Given her ongoing pelvic pain, patient agrees to proceed with pelvic exam.  Chaperone Anna RN present.  Ultrasound shows normal uterus and left ovary.  Right ovary not visualized.  Low suspicion for ovarian torsion.  We will treat empirically for possible PID as well as bacterial vaginosis.  Urine culture pending.  She is currently on Bactrim as prescribed by urgent care.  We will change this to Keflex to see if it improves her nausea.  Urine culture is pending.  Vitals are reassuring.  She is tolerating p.o.  No vomiting throughout ED course.  Abdomen soft without peritoneal signs  Discussed treatment for UTI and PID as above.  Discussed her sexual partner should be treated as well. Return precautions discussed.  BP 139/86   Pulse 85   Temp 97.6 F (36.4 C) (Oral)   Resp 16   Ht 5\' 3"  (1.6 m)   Wt (!) 153.8 kg   SpO2 97%   BMI 60.05 kg/m   Final Clinical Impression(s) / ED Diagnoses Final diagnoses:  Pelvic pain in female    Rx / DC Orders ED Discharge Orders     None        Wynn Kernes, , MD 08/05/20 [phone]

## 2020-08-04 NOTE — Telephone Encounter (Signed)
Inbound call from patient. States Metoclopramide is not working. Patient is still vomiting. Asking if dosage could be increased. Best contact number [phone]

## 2020-08-04 NOTE — Telephone Encounter (Signed)
Called patient and let her know that Reglan 10 mg was sent into her pharmacy for her to take four times daily.

## 2020-08-04 NOTE — Progress Notes (Signed)
No charge Patient seen yesterday in person for UTI, reports feeling much worse today. Wanted to know what she should do. She is advise to go to the nearest ED for in person assessment for possible pyelonephritis.

## 2020-08-05 ENCOUNTER — Emergency Department (HOSPITAL_COMMUNITY): Payer: Medicaid Other

## 2020-08-05 DIAGNOSIS — R109 Unspecified abdominal pain: Secondary | ICD-10-CM | POA: Diagnosis not present

## 2020-08-05 DIAGNOSIS — N888 Other specified noninflammatory disorders of cervix uteri: Secondary | ICD-10-CM | POA: Diagnosis not present

## 2020-08-05 DIAGNOSIS — K760 Fatty (change of) liver, not elsewhere classified: Secondary | ICD-10-CM | POA: Diagnosis not present

## 2020-08-05 DIAGNOSIS — R102 Pelvic and perineal pain: Secondary | ICD-10-CM | POA: Diagnosis not present

## 2020-08-05 LAB — PREGNANCY, URINE: Preg Test, Ur: NEGATIVE

## 2020-08-05 LAB — URINALYSIS, MICROSCOPIC (REFLEX)

## 2020-08-05 LAB — COMPREHENSIVE METABOLIC PANEL
ALT: 37 U/L (ref 0–44)
AST: 32 U/L (ref 15–41)
Albumin: 4.2 g/dL (ref 3.5–5.0)
Alkaline Phosphatase: 57 U/L (ref 38–126)
Anion gap: 9 (ref 5–15)
BUN: 10 mg/dL (ref 6–20)
CO2: 24 mmol/L (ref 22–32)
Calcium: 8.9 mg/dL (ref 8.9–10.3)
Chloride: 107 mmol/L (ref 98–111)
Creatinine, Ser: 0.85 mg/dL (ref 0.44–1.00)
GFR, Estimated: >60 mL/min (ref >60)
Glucose, Bld: 89 mg/dL (ref 70–99)
Potassium: 4.5 mmol/L (ref 3.5–5.1)
Sodium: 140 mmol/L (ref 135–145)
Total Bilirubin: 0.8 mg/dL (ref 0.3–1.2)
Total Protein: 7.2 g/dL (ref 6.5–8.1)

## 2020-08-05 LAB — WET PREP, GENITAL
Sperm: NONE SEEN
Trich, Wet Prep: NONE SEEN
Yeast Wet Prep HPF POC: NONE SEEN

## 2020-08-05 LAB — URINE CULTURE: Culture: >=100000 — AB

## 2020-08-05 LAB — URINALYSIS, ROUTINE W REFLEX MICROSCOPIC
Bilirubin Urine: NEGATIVE
Glucose, UA: NEGATIVE mg/dL
Ketones, ur: NEGATIVE mg/dL
Nitrite: NEGATIVE
Specific Gravity, Urine: >1.03 — ABNORMAL HIGH (ref 1.005–1.030)
pH: 6 (ref 5.0–8.0)

## 2020-08-05 LAB — LIPASE, BLOOD: Lipase: 38 U/L (ref 11–51)

## 2020-08-05 LAB — GC/CHLAMYDIA PROBE AMP (~~LOC~~) NOT AT ARMC
Chlamydia: NEGATIVE
Comment: NEGATIVE
Comment: NORMAL
Neisseria Gonorrhea: NEGATIVE

## 2020-08-05 MED ORDER — DOXYCYCLINE HYCLATE 100 MG PO TABS
100.0000 mg | ORAL_TABLET | Freq: Once | ORAL | Status: AC
  Administered 2020-08-05: 100 mg via ORAL
  Filled 2020-08-05: qty 1

## 2020-08-05 MED ORDER — METRONIDAZOLE 500 MG PO TABS
500.0000 mg | ORAL_TABLET | Freq: Once | ORAL | Status: AC
  Administered 2020-08-05: 500 mg via ORAL
  Filled 2020-08-05: qty 1

## 2020-08-05 MED ORDER — DOXYCYCLINE HYCLATE 100 MG PO CAPS: 100.0000 mg | ORAL_CAPSULE | Freq: Two times a day (BID) | ORAL | 0 refills | Status: DC

## 2020-08-05 MED ORDER — CEPHALEXIN 500 MG PO CAPS: 500.0000 mg | ORAL_CAPSULE | Freq: Three times a day (TID) | ORAL | 0 refills | Status: DC

## 2020-08-05 MED ORDER — CEFTRIAXONE SODIUM 250 MG IJ SOLR: 250.0000 mg | Freq: Once | INTRAMUSCULAR | Status: DC

## 2020-08-05 MED ORDER — METRONIDAZOLE 500 MG PO TABS: 500.0000 mg | ORAL_TABLET | Freq: Two times a day (BID) | ORAL | 0 refills | Status: DC

## 2020-08-05 NOTE — Discharge Instructions (Addendum)
There is no evidence of kidney stone.  Stop taking Bactrim and take Keflex instead.  Also take doxycycline for possible pelvic infection.  Your sexual partner should be treated as well.  Follow-up with your doctor.  Return to ED with worsening pain, vomiting, fever, any other concerns

## 2020-08-05 NOTE — ED Notes (Signed)
Patient was given orange juice.

## 2020-08-06 ENCOUNTER — Other Ambulatory Visit: Payer: Self-pay

## 2020-08-06 ENCOUNTER — Encounter (HOSPITAL_COMMUNITY): Payer: Self-pay | Admitting: *Deleted

## 2020-08-06 ENCOUNTER — Emergency Department (HOSPITAL_COMMUNITY)
Admission: EM | Admit: 2020-08-06 | Discharge: 2020-08-06 | Disposition: A | Discharge status: Home / Self Care | Payer: Medicaid Other | Attending: Emergency Medicine | Admitting: Emergency Medicine

## 2020-08-06 DIAGNOSIS — N9489 Other specified conditions associated with female genital organs and menstrual cycle: Secondary | ICD-10-CM | POA: Insufficient documentation

## 2020-08-06 DIAGNOSIS — R1084 Generalized abdominal pain: Secondary | ICD-10-CM | POA: Insufficient documentation

## 2020-08-06 DIAGNOSIS — K219 Gastro-esophageal reflux disease without esophagitis: Secondary | ICD-10-CM | POA: Insufficient documentation

## 2020-08-06 LAB — COMPREHENSIVE METABOLIC PANEL
ALT: 43 U/L (ref 0–44)
AST: 32 U/L (ref 15–41)
Albumin: 4.4 g/dL (ref 3.5–5.0)
Alkaline Phosphatase: 62 U/L (ref 38–126)
Anion gap: 8 (ref 5–15)
BUN: 10 mg/dL (ref 6–20)
CO2: 25 mmol/L (ref 22–32)
Calcium: 9.7 mg/dL (ref 8.9–10.3)
Chloride: 106 mmol/L (ref 98–111)
Creatinine, Ser: 0.71 mg/dL (ref 0.44–1.00)
GFR, Estimated: >60 mL/min (ref >60)
Glucose, Bld: 91 mg/dL (ref 70–99)
Potassium: 3.6 mmol/L (ref 3.5–5.1)
Sodium: 139 mmol/L (ref 135–145)
Total Bilirubin: 0.6 mg/dL (ref 0.3–1.2)
Total Protein: 7.3 g/dL (ref 6.5–8.1)

## 2020-08-06 LAB — URINE CULTURE

## 2020-08-06 LAB — URINALYSIS, ROUTINE W REFLEX MICROSCOPIC
Bilirubin Urine: NEGATIVE
Glucose, UA: NEGATIVE mg/dL
Ketones, ur: NEGATIVE mg/dL
Nitrite: NEGATIVE
Protein, ur: 30 mg/dL — AB
RBC / HPF: >50 RBC/hpf — ABNORMAL HIGH (ref 0–5)
Specific Gravity, Urine: 1.028 (ref 1.005–1.030)
pH: 5 (ref 5.0–8.0)

## 2020-08-06 LAB — CBC
HCT: 40.6 % (ref 36.0–46.0)
Hemoglobin: 13 g/dL (ref 12.0–15.0)
MCH: 27.4 pg (ref 26.0–34.0)
MCHC: 32 g/dL (ref 30.0–36.0)
MCV: 85.5 fL (ref 80.0–100.0)
Platelets: 275 10*3/uL (ref 150–400)
RBC: 4.75 MIL/uL (ref 3.87–5.11)
RDW: 13.2 % (ref 11.5–15.5)
WBC: 8.6 10*3/uL (ref 4.0–10.5)
nRBC: 0 % (ref 0.0–0.2)

## 2020-08-06 LAB — I-STAT BETA HCG BLOOD, ED (MC, WL, AP ONLY): I-stat hCG, quantitative: <5 m[IU]/mL (ref <5)

## 2020-08-06 LAB — LIPASE, BLOOD: Lipase: 30 U/L (ref 11–51)

## 2020-08-06 MED ORDER — HYDROCODONE-ACETAMINOPHEN 5-325 MG PO TABS: 1.0000 | ORAL_TABLET | Freq: Four times a day (QID) | ORAL | 0 refills | Status: DC | PRN

## 2020-08-06 MED ORDER — HYDROCODONE-ACETAMINOPHEN 5-325 MG PO TABS
2.0000 | ORAL_TABLET | Freq: Once | ORAL | Status: AC
  Administered 2020-08-06: 2 via ORAL
  Filled 2020-08-06: qty 2

## 2020-08-06 NOTE — ED Triage Notes (Signed)
Pt complains of lower abdominal/back pain that became worse at work today. She states she was diagnosed with infection in her uterus a couple of days ago.

## 2020-08-06 NOTE — ED Provider Notes (Signed)
[facility]-EMERGENCY DEPT Provider Note   CSN: [id number] Arrival date & time: 08/06/20  1849     History Chief Complaint  Patient presents with   Abdominal Pain    Katelyn Aguilar is a 20 y.o. female.  The history is provided by the patient. No language interpreter was used.  Abdominal Pain Pain location:  Suprapubic Pain quality: aching   Pain radiates to:  Does not radiate Pain severity:  Moderate Onset quality:  Gradual Timing:  Constant Progression:  Worsening Chronicity:  New Relieved by:  Nothing Worsened by:  Nothing Ineffective treatments:  None tried Associated symptoms: no vaginal bleeding and no vaginal discharge   Risk factors: not pregnant   Pt complains of lower abdominal pain.  Pt reports she is on antibiotics for a pelvic infection. Pt reports pain is worse today.  Pt reports she had to leave work.     Past Medical History:  Diagnosis Date   Allergic rhinitis    Anxiety    Dysrhythmia    tachycardia   GERD (gastroesophageal reflux disease)    Obesity     Patient Active Problem List   Diagnosis Date Noted   Pain of left breast 06/26/2020   BMI 60.0-69.9, adult (HCC) 05/19/2020   Contraception management 05/19/2020   Anxiety with depression 05/09/2020   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/26/2020   Pilonidal cyst without abscess 04/18/2020   Tachycardia 02/28/2020   Fatigue 02/28/2020   Screen for STD (sexually transmitted disease) 01/12/2020   Irregular menses 12/04/2019   Pilonidal abscess 12/04/2019   Pyelonephritis 09/15/2019   Dizziness 04/22/2019   Cramp in limb 04/10/2019   Nausea and vomiting 04/06/2019   Simple tics 11/17/2018   Anxiety 11/17/2018   Involuntary movements 10/21/2018   Encounter for birth control 10/21/2018    Past Surgical History:  Procedure Laterality Date   BIOPSY  04/19/2020   Procedure: BIOPSY;  Surgeon: Tressia DanasBeavers, Kimberly, MD;  [facility]: [facility];  Service: Gastroenterology;;    ESOPHAGOGASTRODUODENOSCOPY     ESOPHAGOGASTRODUODENOSCOPY (EGD) WITH PROPOFOL N/A 04/19/2020   Procedure: ESOPHAGOGASTRODUODENOSCOPY (EGD) WITH PROPOFOL;  Surgeon: Tressia DanasBeavers, Kimberly, MD;  Location: [facility];  Service: Gastroenterology;  Laterality: N/A;   INCISION AND DRAINAGE ABSCESS N/A 04/26/2020   Procedure: excision of pilonidal cyst with open packing;  Surgeon: Darnell LevelGerkin, Todd, MD;  Location: [facility];  Service: General;  Laterality: N/A;   NO PAST SURGERIES       OB History   No obstetric history on file.     Family History  Problem Relation Age of Onset   COPD Mother    Depression Mother    Cancer Mother        blood cancer   Diabetes Other    Colon cancer Neg Hx    Esophageal cancer Neg Hx    Pancreatic cancer Neg Hx    Stomach cancer Neg Hx     Social History   Tobacco Use   Smoking status: Never    Passive exposure: Yes   Smokeless tobacco: Never  Vaping Use   Vaping Use: Former  Substance Use Topics   Alcohol use: Yes    Comment: occasional   Drug use: No    Home Medications Prior to Admission medications   Medication Sig Start Date End Date Taking? Authorizing Provider  acetaminophen (TYLENOL) 325 MG tablet Take 650 mg by mouth every 6 (six) hours as needed for moderate pain.    [provider]  albuterol (VENTOLIN  HFA) 108 (90 Base) MCG/ACT inhaler Inhale 1-2 puffs into the lungs every 6 (six) hours as needed for wheezing or shortness of breath.    [provider]  ARIPiprazole (ABILIFY) 5 MG tablet Take 1 tablet (5 mg total) by mouth daily. 05/03/20   Levert Feinstein, MD  calcium carbonate (TUMS - DOSED IN MG ELEMENTAL CALCIUM) 500 MG chewable tablet Chew 1 tablet by mouth daily as needed for indigestion or heartburn.    [provider]  cephALEXin (KEFLEX) 500 MG capsule Take 1 capsule (500 mg total) by mouth 3 (three) times daily. 08/05/20   Rancour, Jeannett Senior, MD  cetirizine (ZYRTEC) 10 MG tablet Take 1 tablet (10 mg total) by mouth  daily. 11/10/19   Dana Allan, MD  doxycycline (VIBRAMYCIN) 100 MG capsule Take 1 capsule (100 mg total) by mouth 2 (two) times daily. 08/05/20   Rancour, Jeannett Senior, MD  escitalopram (LEXAPRO) 10 MG tablet TAKE 1 TABLET(10 MG) BY MOUTH DAILY 07/15/20   Dana Allan, MD  hydrOXYzine (ATARAX/VISTARIL) 10 MG tablet Take 1 tablet (10 mg total) by mouth 2 (two) times daily as needed. May increase to three times a day after 1 week if needed 05/17/20   Dana Allan, MD  ibuprofen (ADVIL) 600 MG tablet Take 1 tablet (600 mg total) by mouth every 8 (eight) hours as needed. 06/20/20   Dana Allan, MD  metoCLOPramide (REGLAN) 10 MG tablet Take 1 tablet (10 mg total) by mouth 4 (four) times daily. 08/04/20   Unk Lightning, PA  metroNIDAZOLE (FLAGYL) 500 MG tablet Take 1 tablet (500 mg total) by mouth 2 (two) times daily. 08/05/20   Rancour, Jeannett Senior, MD  Multiple Vitamin (MULTIVITAMIN WITH MINERALS) TABS tablet Take 1 tablet by mouth daily.    [provider]  pantoprazole (PROTONIX) 40 MG tablet Take 1 tablet (40 mg total) by mouth 2 (two) times daily. 03/09/20   Tressia Danas, MD  phenazopyridine (PYRIDIUM) 200 MG tablet Take 1 tablet (200 mg total) by mouth 3 (three) times daily. 08/03/20   Particia Nearing, PA-C    Allergies    Patient has no known allergies.  Review of Systems   Review of Systems  Gastrointestinal:  Positive for abdominal pain.  Genitourinary:  Negative for vaginal bleeding and vaginal discharge.  All other systems reviewed and are negative.  Physical Exam Updated Vital Signs BP (!) 146/106   Pulse 94   Temp 98.5 F (36.9 C) (Oral)   Resp 18   SpO2 100%   Physical Exam Vitals and nursing note reviewed.  Constitutional:      Appearance: She is well-developed.  HENT:     Head: Normocephalic.  Cardiovascular:     Rate and Rhythm: Normal rate and regular rhythm.  Pulmonary:     Effort: Pulmonary effort is normal.  Abdominal:     General: Abdomen is flat.  Bowel sounds are normal. There is no distension.     Palpations: Abdomen is soft.     Tenderness: There is abdominal tenderness.  Musculoskeletal:        General: Normal range of motion.     Cervical back: Normal range of motion.  Skin:    General: Skin is warm.  Neurological:     Mental Status: She is alert and oriented to person, place, and time.    ED Results / Procedures / Treatments   Labs (all labs ordered are listed, but only abnormal results are displayed) Labs Reviewed  URINALYSIS, ROUTINE W REFLEX MICROSCOPIC -  Abnormal; Notable for the following components:      Result Value   APPearance CLOUDY (*)    Hgb urine dipstick LARGE (*)    Protein, ur 30 (*)    Leukocytes,Ua SMALL (*)    RBC / HPF >50 (*)    Bacteria, UA FEW (*)    All other components within normal limits  LIPASE, BLOOD  COMPREHENSIVE METABOLIC PANEL  CBC  I-STAT BETA HCG BLOOD, ED ([facility], [facility], [facility] ONLY)    EKG None  Radiology US Transvaginal Non-OB  Result Date: 08/05/2020 CLINICAL DATA:  Pelvic pain.  Started Depo 6 months ago. EXAM: TRANSABDOMINAL AND TRANSVAGINAL ULTRASOUND OF PELVIS DOPPLER ULTRASOUND OF OVARIES TECHNIQUE: Both transabdominal and transvaginal ultrasound examinations of the pelvis were performed. Transabdominal technique was performed for global imaging of the pelvis including uterus, ovaries, adnexal regions, and pelvic cul-de-sac. It was necessary to proceed with endovaginal exam following the transabdominal exam to visualize the ovaries and endometrium. Color and duplex Doppler ultrasound was utilized to evaluate blood flow to the ovaries. COMPARISON:  CT 08/05/2020 FINDINGS: Uterus Measurements: 6.9 x 3.5 x 4.2 cm = volume: 53 mL. Uterus is anteverted. No myometrial mass lesions identified. Small nabothian cysts in the cervix. Endometrium Thickness: 7 mm.  No focal abnormality visualized. Right ovary Right ovary is not visualized. Left ovary Measurements: 2.4 x 1.5 x 1.5 cm = volume: 3 mL.  Limited visualization, only seen transabdominally. No abnormality suggested. Pulsed Doppler evaluation of the visualized left ovary demonstrates normal low-resistance arterial and venous waveforms. Other findings No abnormal free fluid. IMPRESSION: Limited examination. Right ovary is not visualized. Uterus and visualized left ovary are unremarkable. Electronically Signed   By: Burman Nieves M.D.   On: 08/05/2020 01:51   US Pelvis Complete  Result Date: 08/05/2020 CLINICAL DATA:  Pelvic pain.  Started Depo 6 months ago. EXAM: TRANSABDOMINAL AND TRANSVAGINAL ULTRASOUND OF PELVIS DOPPLER ULTRASOUND OF OVARIES TECHNIQUE: Both transabdominal and transvaginal ultrasound examinations of the pelvis were performed. Transabdominal technique was performed for global imaging of the pelvis including uterus, ovaries, adnexal regions, and pelvic cul-de-sac. It was necessary to proceed with endovaginal exam following the transabdominal exam to visualize the ovaries and endometrium. Color and duplex Doppler ultrasound was utilized to evaluate blood flow to the ovaries. COMPARISON:  CT 08/05/2020 FINDINGS: Uterus Measurements: 6.9 x 3.5 x 4.2 cm = volume: 53 mL. Uterus is anteverted. No myometrial mass lesions identified. Small nabothian cysts in the cervix. Endometrium Thickness: 7 mm.  No focal abnormality visualized. Right ovary Right ovary is not visualized. Left ovary Measurements: 2.4 x 1.5 x 1.5 cm = volume: 3 mL. Limited visualization, only seen transabdominally. No abnormality suggested. Pulsed Doppler evaluation of the visualized left ovary demonstrates normal low-resistance arterial and venous waveforms. Other findings No abnormal free fluid. IMPRESSION: Limited examination. Right ovary is not visualized. Uterus and visualized left ovary are unremarkable. Electronically Signed   By: Burman Nieves M.D.   On: 08/05/2020 01:51   [place] Art/Ven Flow Abd Pelv Doppler  Result Date: 08/05/2020 CLINICAL DATA:  Pelvic pain.   Started Depo 6 months ago. EXAM: TRANSABDOMINAL AND TRANSVAGINAL ULTRASOUND OF PELVIS DOPPLER ULTRASOUND OF OVARIES TECHNIQUE: Both transabdominal and transvaginal ultrasound examinations of the pelvis were performed. Transabdominal technique was performed for global imaging of the pelvis including uterus, ovaries, adnexal regions, and pelvic cul-de-sac. It was necessary to proceed with endovaginal exam following the transabdominal exam to visualize the ovaries and endometrium. Color and duplex Doppler ultrasound was utilized  to evaluate blood flow to the ovaries. COMPARISON:  CT 08/05/2020 FINDINGS: Uterus Measurements: 6.9 x 3.5 x 4.2 cm = volume: 53 mL. Uterus is anteverted. No myometrial mass lesions identified. Small nabothian cysts in the cervix. Endometrium Thickness: 7 mm.  No focal abnormality visualized. Right ovary Right ovary is not visualized. Left ovary Measurements: 2.4 x 1.5 x 1.5 cm = volume: 3 mL. Limited visualization, only seen transabdominally. No abnormality suggested. Pulsed Doppler evaluation of the visualized left ovary demonstrates normal low-resistance arterial and venous waveforms. Other findings No abnormal free fluid. IMPRESSION: Limited examination. Right ovary is not visualized. Uterus and visualized left ovary are unremarkable. Electronically Signed   By: Burman Nieves M.D.   On: 08/05/2020 01:51   CT Renal Stone Study  Result Date: 08/05/2020 CLINICAL DATA:  Bilateral flank pain for 2 days. History of urinary tract infection EXAM: CT ABDOMEN AND PELVIS WITHOUT CONTRAST TECHNIQUE: Multidetector CT imaging of the abdomen and pelvis was performed following the standard protocol without IV contrast. COMPARISON:  None. FINDINGS: Lower chest: Lung bases are clear. Hepatobiliary: Diffuse fatty infiltration of the liver. No focal lesions. Gallbladder and bile ducts are unremarkable. Pancreas: Unremarkable. No pancreatic ductal dilatation or surrounding inflammatory changes. Spleen:  Normal in size without focal abnormality. Adrenals/Urinary Tract: Adrenal glands are unremarkable. Kidneys are normal, without renal calculi, focal lesion, or hydronephrosis. Bladder is unremarkable. Stomach/Bowel: Stomach is within normal limits. Appendix appears normal. No evidence of bowel wall thickening, distention, or inflammatory changes. Vascular/Lymphatic: No significant vascular findings are present. No enlarged abdominal or pelvic lymph nodes. Reproductive: Uterus and bilateral adnexa are unremarkable. Other: No abdominal wall hernia or abnormality. No abdominopelvic ascites. Musculoskeletal: No acute or significant osseous findings. IMPRESSION: 1. No renal or ureteral stone or obstruction. 2. Diffuse fatty infiltration of the liver. Electronically Signed   By: Burman Nieves M.D.   On: 08/05/2020 00:39    Procedures Procedures   Medications Ordered in ED Medications  HYDROcodone-acetaminophen (NORCO/VICODIN) 5-325 MG per tablet 2 tablet (has no administration in time range)    ED Course  I have reviewed the triage vital signs and the nursing notes.  Pertinent labs & imaging results that were available during my care of the patient were reviewed by me and considered in my medical decision making (see chart for details).    MDM Rules/Calculators/A&P                          MDM:  labs reviewed, ct scan and ultrasound reviewed,   Pt advised to continue antibiotics.  Pt given hydrocodone here.  Rx for hydrocodone.  Out of work x 2 days  Final Clinical Impression(s) / ED Diagnoses Final diagnoses:  Generalized abdominal pain    Rx / DC Orders ED Discharge Orders          Ordered    HYDROcodone-acetaminophen (NORCO/VICODIN) 5-325 MG tablet  Every 6 hours PRN        08/06/20 2203          An After Visit Summary was printed and given to the patient.    Osie Cheeks 08/06/20 2206    Lorre Nick, MD 08/07/20 [phone]

## 2020-08-06 NOTE — ED Provider Notes (Signed)
Emergency Medicine Provider Triage Evaluation Note  Katelyn Aguilar , a 20 y.o. female  was evaluated in triage.  Pt complains of abdominal pain.  Pt reports she is being treated for a vaginal infection   Review of Systems  Positive: nausea Negative: fever  Physical Exam  BP (!) 146/106   Pulse 94   Temp 98.5 F (36.9 C) (Oral)   Resp 18   SpO2 100%  Gen:   Awake, no distress   Resp:  Normal effort  MSK:   Moves extremities without difficulty  Other:    Medical Decision Making  Medically screening exam initiated at 7:25 PM.  Appropriate orders placed.  ALLEE BUSK was informed that the remainder of the evaluation will be completed by another provider, this initial triage assessment does not replace that evaluation, and the importance of remaining in the ED until their evaluation is complete.     Osie Cheeks 08/06/20 1927    Lorre Nick, MD 08/07/20 [phone]

## 2020-08-08 ENCOUNTER — Telehealth: Payer: Self-pay

## 2020-08-08 NOTE — Telephone Encounter (Signed)
Transition Care Management Unsuccessful Follow-up Telephone Call  Date of discharge and from where:  08/06/2020- [facility]  Attempts:  1st Attempt  Reason for unsuccessful TCM follow-up call:  Left voice message    

## 2020-08-09 NOTE — Telephone Encounter (Signed)
Transition Care Management Follow-up Telephone Call Date of discharge and from where: 08/06/2020- [facility]  How have you been since you were released from the hospital? Feeling a little better Any questions or concerns? No  Items Reviewed: Did the pt receive and understand the discharge instructions provided? Yes  Medications obtained and verified? Yes  Other? No  Any new allergies since your discharge? No  Dietary orders reviewed? N/A Do you have support at home? Yes   Home Care and Equipment/Supplies: Were home health services ordered? not applicable If so, what is the name of the agency? N/A  Has the agency set up a time to come to the patient's home? not applicable Were any new equipment or medical supplies ordered?  No What is the name of the medical supply agency? N/A Were you able to get the supplies/equipment? not applicable Do you have any questions related to the use of the equipment or supplies? No  Functional Questionnaire: (I = Independent and D = Dependent) ADLs: I  Bathing/Dressing- I  Meal Prep- I  Eating- I  Maintaining continence- I  Transferring/Ambulation- I  Managing Meds- I  Follow up appointments reviewed:  PCP Hospital f/u appt confirmed? No  patient already has appointment schedule with Dr. Clent Ridges for different reason. Will discuss hospital visit at that time.  Specialist Hospital f/u appt confirmed? No   Are transportation arrangements needed? No  If their condition worsens, is the pt aware to call PCP or go to the Emergency Dept.? Yes Was the patient provided with contact information for the PCP's office or ED? Yes Was to pt encouraged to call back with questions or concerns? Yes

## 2020-08-16 NOTE — Patient Instructions (Addendum)
Thank you for coming to see me today. It was a pleasure. Today we talked about:    Warm water soaks as needed  Please follow-up with PCP as needed  If you have any questions or concerns, please do not hesitate to call the office at [phone].  Best,   Dana Allan, MD

## 2020-08-16 NOTE — Progress Notes (Signed)
    SUBJECTIVE:   CHIEF COMPLAINT / HPI: follow up for anxiety and right toe pain  Presents for follow up for anxiety. Seen in clinic on 06/30 and no changes to medications.  Since then patient reports a little worsening in anxiety at work.  She has tried to not take the Atarax despite that she reports it does help when she uses it. Tolerating Lexapro at current dose.  No SI/HI  Ingrown toenail Has been ongoing for a few months.  Used to cut nails short and at sides.  Recently right great toe now more painful especially if she stubs toe. Has noticed some redness but no drainage and denies any fevers.  Similar nail on left great toe.  PERTINENT  PMH / PSH:  Anxiety/Depression Elevated BMI   OBJECTIVE:   BP 118/70   Pulse 85   Ht 5\' 3"  (1.6 m)   Wt (!) 333 lb 12.8 oz (151.4 kg)   SpO2 93%   BMI 59.13 kg/m    General: Alert, no acute distress Inward growth of Right great toenail that has embedded on both sides of nail bed.  Mild erythema and edema noted without warmth or drainage.  Painful to touch.  Left toe is similar in presentation.    ASSESSMENT/PLAN:   Anxiety with depression PHQ9 remains unchanged from last visit.  GAD has increase from 7 to 11.  Has not been trying to decrease taking Atarax.  Medication does help when used. No SI/HI -Encouraged to use Atarax to dose that relieves symptoms -Continue Atarax 10 gm TID as needed -Contiue Lexapro 10 mg daily -Appointment with [facility] 09/06 -Follow up as in 2-4 weeks -Strict return precautions provided   Ingrown toenail of both feet Plan for toenail removal -Scheduled for 08/04 for removal of right great toenail -Schedule for 08/27 for removal of left great toenail -Warm soaks as needed -Ibuprofen prn      9/27, MD [facility]

## 2020-08-17 ENCOUNTER — Ambulatory Visit (INDEPENDENT_AMBULATORY_CARE_PROVIDER_SITE_OTHER): Payer: Medicaid Other | Admitting: Family Medicine

## 2020-08-17 ENCOUNTER — Other Ambulatory Visit: Payer: Self-pay

## 2020-08-17 ENCOUNTER — Encounter: Payer: Self-pay | Admitting: Family Medicine

## 2020-08-17 VITALS — BP 118/70 | HR 85 | Ht 63.0 in | Wt 333.8 lb

## 2020-08-17 DIAGNOSIS — F418 Other specified anxiety disorders: Secondary | ICD-10-CM | POA: Diagnosis not present

## 2020-08-17 DIAGNOSIS — L600 Ingrowing nail: Secondary | ICD-10-CM

## 2020-08-19 ENCOUNTER — Encounter: Payer: Self-pay | Admitting: Family Medicine

## 2020-08-19 DIAGNOSIS — L600 Ingrowing nail: Secondary | ICD-10-CM | POA: Insufficient documentation

## 2020-08-19 NOTE — Assessment & Plan Note (Signed)
Plan for toenail removal -Scheduled for 08/04 for removal of right great toenail -Schedule for 08/27 for removal of left great toenail -Warm soaks as needed -Ibuprofen prn

## 2020-08-19 NOTE — Assessment & Plan Note (Addendum)
PHQ9 remains unchanged from last visit.  GAD has increase from 7 to 11.  Has not been trying to decrease taking Atarax.  Medication does help when used. No SI/HI -Encouraged to use Atarax to dose that relieves symptoms -Continue Atarax 10 gm TID as needed -Contiue Lexapro 10 mg daily -Appointment with [facility] 09/06 -Follow up as in 2-4 weeks -Strict return precautions provided

## 2020-08-24 ENCOUNTER — Encounter: Payer: Self-pay | Admitting: Family Medicine

## 2020-08-24 ENCOUNTER — Other Ambulatory Visit: Payer: Self-pay | Admitting: Family Medicine

## 2020-08-25 ENCOUNTER — Other Ambulatory Visit: Payer: Self-pay

## 2020-08-25 ENCOUNTER — Encounter (HOSPITAL_COMMUNITY): Payer: Self-pay

## 2020-08-25 ENCOUNTER — Ambulatory Visit (HOSPITAL_COMMUNITY)
Admission: EM | Admit: 2020-08-25 | Discharge: 2020-08-25 | Disposition: A | Discharge status: Home / Self Care | Payer: Medicaid Other | Attending: Internal Medicine | Admitting: Internal Medicine

## 2020-08-25 DIAGNOSIS — Z3202 Encounter for pregnancy test, result negative: Secondary | ICD-10-CM | POA: Diagnosis not present

## 2020-08-25 LAB — POCT URINALYSIS DIPSTICK, ED / UC
Bilirubin Urine: NEGATIVE
Glucose, UA: NEGATIVE mg/dL
Ketones, ur: NEGATIVE mg/dL
Nitrite: NEGATIVE
Protein, ur: NEGATIVE mg/dL
Specific Gravity, Urine: 1.02 (ref 1.005–1.030)
Urobilinogen, UA: 0.2 mg/dL (ref 0.0–1.0)
pH: 6 (ref 5.0–8.0)

## 2020-08-25 LAB — POC URINE PREG, ED: Preg Test, Ur: NEGATIVE

## 2020-08-25 NOTE — Discharge Instructions (Addendum)
Urine pregnancy test is negative Start prenatal vitamins Return to urgent care if you have any further complaints/symptoms.

## 2020-08-25 NOTE — ED Triage Notes (Signed)
Pt requested pregnancy test. States having cravings, nausea, increased urinary frequency. Reports home pregnancy test was negative.   Pt reports she had pain during intercourse yesterday

## 2020-08-27 NOTE — ED Provider Notes (Addendum)
[facility]    CSN: [id number] Arrival date & time: 08/25/20  1024      History   Chief Complaint Chief Complaint  Patient presents with   Possible Pregnancy    HPI Katelyn Aguilar is a 20 y.o. female comes to the urgent care requesting pregnancy testing. Patient endorses some pain during sexual intercourse. She has the same sexual partner and is not worried about STD. No morning sickness. Home pregnancy test was negative.  Patient denies any dysuria urgency or frequency.  No vaginal discharge.  Patient is currently trying to get pregnant  HPI  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   Tachycardia 02/28/2020   Fatigue 02/28/2020   Screen for STD (sexually transmitted disease) 01/12/2020   Irregular menses 12/04/2019   Dizziness 04/22/2019   Cramp in limb 04/10/2019   Simple tics 11/17/2018   Involuntary movements 10/21/2018   Encounter for birth control 10/21/2018    Past Surgical History:  Procedure Laterality Date   BIOPSY  04/19/2020   Procedure: BIOPSY;  Surgeon: Tressia Danas, MD;  Location: [facility];  Service: Gastroenterology;;   ESOPHAGOGASTRODUODENOSCOPY     ESOPHAGOGASTRODUODENOSCOPY (EGD) WITH PROPOFOL N/A 04/19/2020   Procedure: ESOPHAGOGASTRODUODENOSCOPY (EGD) WITH PROPOFOL;  Surgeon: Tressia Danas, MD;  Location: [facility];  Service: Gastroenterology;  Laterality: N/A;    INCISION AND DRAINAGE ABSCESS N/A 04/26/2020   Procedure: excision of pilonidal cyst with open packing;  Surgeon: Darnell Level, MD;  Location: [facility];  Service: General;  Laterality: N/A;   NO PAST SURGERIES      OB History   No obstetric history on file.      Home Medications    Prior to Admission medications   Medication Sig Start Date End Date Taking? Authorizing Provider  acetaminophen (TYLENOL) 325 MG tablet Take 650 mg by mouth every 6 (six) hours as needed for moderate pain.    [provider]  ARIPiprazole (ABILIFY) 5 MG tablet Take 1 tablet (5 mg total) by mouth daily. 05/03/20   Levert Feinstein, MD  calcium carbonate (TUMS - DOSED IN MG ELEMENTAL CALCIUM) 500 MG chewable tablet Chew 1 tablet by mouth daily as needed for indigestion or heartburn.    [provider]  cetirizine (ZYRTEC) 10 MG tablet Take 1 tablet (10 mg total) by mouth daily. 11/10/19   Dana Allan, MD  escitalopram (LEXAPRO) 10 MG tablet TAKE 1 TABLET(10 MG) BY MOUTH DAILY 08/24/20   Dana Allan, MD  hydrOXYzine (ATARAX/VISTARIL) 10 MG tablet Take 1 tablet (10 mg total) by mouth 2 (two) times daily as needed. May increase to three times a day after 1 week if needed 05/17/20   Dana Allan, MD  metoCLOPramide (REGLAN) 10 MG tablet Take 1 tablet (10 mg total) by mouth 4 (four) times daily. 08/04/20   Unk Lightning, PA  Multiple Vitamin (MULTIVITAMIN WITH MINERALS) TABS tablet  Take 1 tablet by mouth daily.    [provider]  pantoprazole (PROTONIX) 40 MG tablet Take 1 tablet (40 mg total) by mouth 2 (two) times daily. 03/09/20   Tressia Danas, MD    Family History Family History  Problem Relation Age of Onset   COPD Mother    Depression Mother    Cancer Mother        blood cancer   Diabetes Other    Colon cancer Neg Hx    Esophageal cancer Neg Hx    Pancreatic cancer Neg Hx    Stomach cancer Neg Hx     Social History Social History   Tobacco Use   Smoking status: Never     Passive exposure: Yes   Smokeless tobacco: Never  Vaping Use   Vaping Use: Former  Substance Use Topics   Alcohol use: Yes    Comment: occasional   Drug use: No     Allergies   Patient has no known allergies.   Review of Systems Review of Systems  Gastrointestinal: Negative.   Genitourinary: Negative.     Physical Exam Triage Vital Signs ED Triage Vitals  Enc Vitals Group     BP 08/25/20 1119 130/90     Pulse Rate 08/25/20 1119 96     Resp 08/25/20 1119 18     Temp 08/25/20 1119 98.3 F (36.8 C)     Temp Source 08/25/20 1119 Oral     SpO2 08/25/20 1119 99 %     Weight --      Height --      Head Circumference --      Peak Flow --      Pain Score 08/25/20 1117 0     Pain Loc --      Pain Edu? --      Excl. in GC? --    No data found.  Updated Vital Signs BP 130/90 (BP Location: Right Wrist)   Pulse 96   Temp 98.3 F (36.8 C) (Oral)   Resp 18   LMP  (LMP Unknown)   SpO2 99%   Visual Acuity Right Eye Distance:   Left Eye Distance:   Bilateral Distance:    Right Eye Near:   Left Eye Near:    Bilateral Near:     Physical Exam Vitals and nursing note reviewed.  Constitutional:      General: She is not in acute distress.    Appearance: She is obese. She is not ill-appearing.  Cardiovascular:     Rate and Rhythm: Normal rate and regular rhythm.     Pulses: Normal pulses.     Heart sounds: Normal heart sounds.  Pulmonary:     Effort: Pulmonary effort is normal.     Breath sounds: Normal breath sounds.  Abdominal:     General: Bowel sounds are normal.     Palpations: Abdomen is soft.  Neurological:     Mental Status: She is alert.     UC Treatments / Results  Labs (all labs ordered are listed, but only abnormal results are displayed) Labs Reviewed  POCT URINALYSIS DIPSTICK, ED / UC - Abnormal; Notable for the following components:      Result Value   Hgb urine dipstick MODERATE (*)    Leukocytes,Ua TRACE (*)    All other components  within normal limits  POC URINE PREG, ED    EKG   Radiology No results found.  Procedures Procedures (including critical care time)  Medications Ordered in UC Medications - No data to display  Initial Impression / Assessment and Plan / UC Course  I have reviewed the triage vital signs and the nursing notes.  Pertinent labs & imaging results that were available during my care of the patient were reviewed by me and considered in my medical decision making (see chart for details).     1.  Negative pregnancy test Reassurance given to the patient Urine pregnancy test was negative Urinalysis was significant for leukocyte Estrace. Patient denies any dysuria, urgency or frequency hence UTI is not the likely diagnosis.  Final Clinical Impressions(s) / UC Diagnoses   Final diagnoses:  Negative pregnancy test     Discharge Instructions      Urine pregnancy test is negative Start prenatal vitamins Return to urgent care if you have any further complaints/symptoms.   ED Prescriptions   None    PDMP not reviewed this encounter.   Merrilee Jansky, MD 08/27/20 Jorje Guild    Merrilee Jansky, MD 08/27/20 [phone]

## 2020-08-29 DIAGNOSIS — Z419 Encounter for procedure for purposes other than remedying health state, unspecified: Secondary | ICD-10-CM | POA: Diagnosis not present

## 2020-08-30 ENCOUNTER — Other Ambulatory Visit: Payer: Self-pay

## 2020-08-30 ENCOUNTER — Encounter: Payer: Self-pay | Admitting: Family Medicine

## 2020-08-30 ENCOUNTER — Ambulatory Visit (INDEPENDENT_AMBULATORY_CARE_PROVIDER_SITE_OTHER): Payer: Medicaid Other | Admitting: Family Medicine

## 2020-08-30 VITALS — BP 112/70 | HR 107 | Ht 63.0 in | Wt 332.0 lb

## 2020-08-30 DIAGNOSIS — Z3200 Encounter for pregnancy test, result unknown: Secondary | ICD-10-CM | POA: Diagnosis not present

## 2020-08-30 NOTE — Progress Notes (Signed)
    SUBJECTIVE:   CHIEF COMPLAINT / HPI:   Katelyn Aguilar is a 20 year old female who presents for evaluation of possible pregnancy and request for blood pregnancy test.   Reports she is trying to get pregnant and stopped taking depo on 08/16/20 (last Depo shot was 05/17/20). She has experienced increased urination, pain with sexual intercourse and cravings for watermelon juice for the past week.  She also had a bout of abdominal cramping for 2 days last Wednesday. Thinks she may be pregnant based on these symptoms. Had a negative urine pregnancy test 4 days ago at Urgent Care. LMP unknown (no periods while on depo, just stopped depo 2 weeks ago and period hasn't resumed yet)  PERTINENT  PMH / PSH:  Ingrown toenails Obesity Gastroparesis Anxiety  OBJECTIVE:   BP 112/70   Pulse (!) 107   Ht 5\' 3"  (1.6 m)   Wt (!) 332 lb (150.6 kg)   LMP  (LMP Unknown)   SpO2 98%   BMI 58.81 kg/m   General: NAD, pleasant, able to participate in exam Respiratory: No respiratory distress Skin: warm and dry, no rashes noted Psych: Normal affect and mood Neuro: grossly intact  ASSESSMENT/PLAN:   Possible Pregnancy Patient concerned about pregnancy due to increased urination, food cravings, and pain with sexual intercourse x1 week. Periods have not resumed since stopping depo 2 weeks ago. Had negative Upreg 4 days ago. Requests blood pregnancy test.  Advised patient that given timeline of stopping Depo and negative Upreg 4 days ago, it is unlikely she is pregnant. Do not feel a serum beta-hcg is indicated at this time. We recommended that she act as if she is pregnant (take prenatal vitamins, abstain from alcohol and tobacco) and return in a few weeks if her symptoms persist and she has not gotten her period. At that time we can consider repeat Upreg and beta Hcg if indicated. Patient agrees to this plan.   , Medical Student [facility]     I was  personally present and re-performed the exam and medical decision making and verified the service and findings are accurately documented in the student's note.  [facility], MD 08/30/2020 6:07 PM

## 2020-08-30 NOTE — Patient Instructions (Signed)
It was great to meet you!  Things we discussed at today's visit: - Since you're desiring pregnancy, it would be a good idea to start taking a prenatal vitamin daily. - If you're still concerned about pregnancy in ~3-4 weeks, please let us know and we would be happy to recheck a urine pregnancy test or possibly a blood test at that time - Please schedule an appointment for your pap smear and pelvic exam   Take care and seek immediate care sooner if you develop any concerns.  Dr. [facility]

## 2020-09-01 ENCOUNTER — Other Ambulatory Visit: Payer: Self-pay

## 2020-09-01 ENCOUNTER — Ambulatory Visit (INDEPENDENT_AMBULATORY_CARE_PROVIDER_SITE_OTHER): Payer: Medicaid Other | Admitting: Family Medicine

## 2020-09-01 ENCOUNTER — Encounter: Payer: Medicaid Other | Admitting: Family Medicine

## 2020-09-01 ENCOUNTER — Encounter: Payer: Self-pay | Admitting: Family Medicine

## 2020-09-01 DIAGNOSIS — L600 Ingrowing nail: Secondary | ICD-10-CM

## 2020-09-01 NOTE — Progress Notes (Signed)
    SUBJECTIVE:   CHIEF COMPLAINT / HPI:   Right great toenail removal Presents for toenail removal today.  She was previously seen and has ingrown toenails on both great toes.  The right foot is more painful than the left so she is going to get that toenail removed first.  She is scheduled to have the left great toenail removed in a few weeks.  We discussed taking part of the nail out but she reports she wants both sides of the nail removed so is okay with having the entire nail removed.  OBJECTIVE:   BP 112/66   Pulse 99   Ht 5\' 3"  (1.6 m)   Wt (!) 333 lb 12.8 oz (151.4 kg)   LMP  (LMP Unknown)   SpO2 99%   BMI 59.13 kg/m   Anxious appearing 20 year old female, no acute distress Respiratory: Speaking in complete sentences, no shortness of breath noted Foot: Patient with ingrown toenail of the right great toe worse on the lateral aspect than the medial aspect, tenderness to palpation on both sides of the toe, no drainage appreciated, no erythema or excessive warmth noted  PRE-OP DIAGNOSIS: Ingrown toenail POST-OP DIAGNOSIS: Same  PROCEDURE: toenail avulsion Performing Physician: 25  Supervising Physician (if applicable): Dr. Nobie Putnam   PROCEDURE:    The area surrounding the skin lesion was prepared and draped in the  usual sterile manner. The patient is placed in the supine position, with  the knees flexed (foot flat on the table) or extended (foot hanging off  the end of the table).   The toe was prepped with povidone-iodine solution. A standard digital  block was performed, using a 10-mL syringe and a 27-gauge needle. (About  2 to 3 mL of lidocaine on each side of the toe is usually sufficient  for adequate anesthesia. A wait of five to 10 minutes allows the block  to become effective. )   The toe was rewashed with surgical solution.   A nail elevator was slid under the cuticle to separate the nail plate from the overlying proximal nail fold.   The entire nail was separated  from the nail plate and removed..  The toe was wrapped using antibiotic ointment and nonadherent gauze.  Followup: The patient tolerated the procedure well without  complications.  Standard post-procedure care is explained and return  precautions are given.  ASSESSMENT/PLAN:   Ingrown toenail of both feet Patient complaining of pain on the medial and lateral aspect of the right great toe.  Toenail removed today.  Patient is scheduled for removal of the left great toenail on 8/27.  Patient tolerated procedure well, see procedure note above.  Ibuprofen as needed, keep covered, standard postprocedure care packet given to patient.     9/27, MD [facility]

## 2020-09-01 NOTE — Patient Instructions (Signed)
It was great seeing you today.  I am sorry you had these issues with your ingrown toenail but hopefully the removal will help with the pain in the long run.  Please keep your foot propped up tonight and be sure to take Tylenol and ibuprofen as needed for pain tomorrow.  If you have any questions or concerns please call the clinic.  I hope you have a wonderful afternoon!

## 2020-09-02 NOTE — Assessment & Plan Note (Signed)
Patient complaining of pain on the medial and lateral aspect of the right great toe.  Toenail removed today.  Patient is scheduled for removal of the left great toenail on 8/27.  Patient tolerated procedure well, see procedure note above.  Ibuprofen as needed, keep covered, standard postprocedure care packet given to patient.

## 2020-09-09 ENCOUNTER — Ambulatory Visit (INDEPENDENT_AMBULATORY_CARE_PROVIDER_SITE_OTHER): Payer: Medicaid Other | Admitting: Gastroenterology

## 2020-09-09 ENCOUNTER — Encounter: Payer: Self-pay | Admitting: Gastroenterology

## 2020-09-09 VITALS — BP 144/82 | HR 112 | Ht 63.0 in | Wt 331.0 lb

## 2020-09-09 DIAGNOSIS — K3184 Gastroparesis: Secondary | ICD-10-CM | POA: Diagnosis not present

## 2020-09-09 NOTE — Patient Instructions (Addendum)
I did not change your medications today.  I would like to see you in the office in 6-12 months. Please let me know if you need anything earlier than that.   If you are age 21 or younger, your body mass index should be between 19-25. Your Body mass index is 58.63 kg/m. If this is out of the aformentioned range listed, please consider follow up with your Primary Care Provider.   __________________________________________________________  The  GI providers would like to encourage you to use [facility] to communicate with providers for non-urgent requests or questions.  Due to long hold times on the telephone, sending your provider a message by [facility] may be a faster and more efficient way to get a response.  Please allow 48 business hours for a response.  Please remember that this is for non-urgent requests.   Thank you for entrusting me with your care and choosing [facility].  Dr Orvan Falconer

## 2020-09-09 NOTE — Progress Notes (Signed)
Referring Provider: Dana Allan, MD Primary Care Physician:  Katelyn Allan, MD  Chief complaint:  Gastroparesis   IMPRESSION:  Gastroparesis with associated symptoms of nausea, vomiting, and epigastric discomfort BMI 60.23  PLAN: - Continue Reglan (patient declined need for refills today) - Declined referral for nutrition consultation today - Consider TCA or mirtazapine if symptoms worsen - Follow-up in 6-12 months, earlier with new symptoms  Please see the "Patient Instructions" section for addition details about the plan.  HPI: Katelyn Aguilar is a 20 y.o. female who returns in follow-up with gastroparesis. Symptoms are well controlled if she remembers to take her medications.  Works a lot. Working multiple 12 hour shifts lately. Doesn't eat while at work.  She finds it difficult to comply with low residue/low fiber/low fat diet, multiple small meals a day, 5-6, separate solids and liquids and drink only sips during meals and hydrate with 4 ounces of water at a time in between meals. Declined nutrition consult.   No new complaints or concerns at this time.   Recent evaluation: - Normal LFTs and lipase 09/11/19 - CT ab/pelvis with contrast 09/12/19: no acute process - H pylori breath test negative 10/06/19 - EGD with gastropathy 04/19/20. Esophageal and duodenal biopsies normal. Parietal cell hyperplasia.  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/20  Past Medical History:  Diagnosis Date   Allergic rhinitis    Anxiety    Anxiety 11/17/2018   Dysrhythmia    tachycardia   GERD (gastroesophageal reflux disease)    Nausea and vomiting 04/06/2019   Obesity    Pilonidal abscess 12/04/2019   Pilonidal cyst without abscess 04/18/2020   Pyelonephritis 09/15/2019    Past Surgical History:  Procedure Laterality Date   BIOPSY  04/19/2020   Procedure: BIOPSY;  Surgeon: Tressia Danas, MD;  Location: [facility];  Service: Gastroenterology;;   ESOPHAGOGASTRODUODENOSCOPY      ESOPHAGOGASTRODUODENOSCOPY (EGD) WITH PROPOFOL N/A 04/19/2020   Procedure: ESOPHAGOGASTRODUODENOSCOPY (EGD) WITH PROPOFOL;  Surgeon: Tressia Danas, MD;  Location: [facility];  Service: Gastroenterology;  Laterality: N/A;   INCISION AND DRAINAGE ABSCESS N/A 04/26/2020   Procedure: excision of pilonidal cyst with open packing;  Surgeon: Darnell Level, MD;  Location: [facility];  Service: General;  Laterality: N/A;   NO PAST SURGERIES      Current Outpatient Medications  Medication Sig Dispense Refill   acetaminophen (TYLENOL) 325 MG tablet Take 650 mg by mouth every 6 (six) hours as needed for moderate pain.     ARIPiprazole (ABILIFY) 5 MG tablet Take 1 tablet (5 mg total) by mouth daily. 90 tablet 0   calcium carbonate (TUMS - DOSED IN MG ELEMENTAL CALCIUM) 500 MG chewable tablet Chew 1 tablet by mouth daily as needed for indigestion or heartburn.     cetirizine (ZYRTEC) 10 MG tablet Take 1 tablet (10 mg total) by mouth daily. 30 tablet 11   escitalopram (LEXAPRO) 10 MG tablet TAKE 1 TABLET(10 MG) BY MOUTH DAILY 30 tablet 0   hydrOXYzine (ATARAX/VISTARIL) 10 MG tablet Take 1 tablet (10 mg total) by mouth 2 (two) times daily as needed. May increase to three times a day after 1 week if needed 90 tablet 0   metoCLOPramide (REGLAN) 10 MG tablet Take 1 tablet (10 mg total) by mouth 4 (four) times daily. 120 tablet 3   pantoprazole (PROTONIX) 40 MG tablet Take 1 tablet (40 mg total) by mouth 2 (two) times daily. 60 tablet 3   No current facility-administered medications for this visit.  Allergies as of 09/09/2020   (No Known Allergies)    Family History  Problem Relation Age of Onset   COPD Mother    Depression Mother    Cancer Mother        blood cancer   Diabetes Other    Colon cancer Neg Hx    Esophageal cancer Neg Hx    Pancreatic cancer Neg Hx    Stomach cancer Neg Hx       Physical Exam: General:   Alert,  well-nourished, pleasant and cooperative in NAD Head:  Normocephalic  and atraumatic. Eyes:  Sclera clear, no icterus.   Conjunctiva pink. Ears:  Normal auditory acuity. Nose:  No deformity, discharge,  or lesions. Mouth:  No deformity or lesions.   Neck:  Supple; no masses or thyromegaly. Lungs:  Clear throughout to auscultation.   No wheezes. Heart:  Regular rate and rhythm; no murmurs. Abdomen:  Soft,nontender, nondistended, normal bowel sounds, no rebound or guarding. No hepatosplenomegaly.   Rectal:  Deferred  Msk:  Symmetrical. No boney deformities LAD: No inguinal or umbilical LAD Extremities:  No clubbing or edema. Neurologic:  Alert and  oriented x4;  grossly nonfocal Skin:  Intact without significant lesions or rashes. Psych:  Alert and cooperative. Normal mood and affect.     Patton Rabinovich L. Orvan Falconer, MD, MPH 09/09/2020, 12:08 PM

## 2020-09-21 ENCOUNTER — Ambulatory Visit: Payer: Medicaid Other | Admitting: Family Medicine

## 2020-09-22 ENCOUNTER — Other Ambulatory Visit: Payer: Self-pay | Admitting: Neurology

## 2020-09-22 ENCOUNTER — Other Ambulatory Visit: Payer: Self-pay

## 2020-09-22 ENCOUNTER — Ambulatory Visit
Admission: EM | Admit: 2020-09-22 | Discharge: 2020-09-22 | Disposition: A | Discharge status: Home / Self Care | Payer: Medicaid Other | Attending: Family Medicine | Admitting: Family Medicine

## 2020-09-22 DIAGNOSIS — R1084 Generalized abdominal pain: Secondary | ICD-10-CM | POA: Diagnosis not present

## 2020-09-22 DIAGNOSIS — R059 Cough, unspecified: Secondary | ICD-10-CM

## 2020-09-22 DIAGNOSIS — R42 Dizziness and giddiness: Secondary | ICD-10-CM

## 2020-09-22 DIAGNOSIS — R000 Tachycardia, unspecified: Secondary | ICD-10-CM

## 2020-09-22 LAB — POCT URINALYSIS DIP (MANUAL ENTRY)
Bilirubin, UA: NEGATIVE
Blood, UA: NEGATIVE
Glucose, UA: NEGATIVE mg/dL
Ketones, POC UA: NEGATIVE mg/dL
Nitrite, UA: NEGATIVE
Protein Ur, POC: NEGATIVE mg/dL
Spec Grav, UA: 1.02 (ref 1.010–1.025)
Urobilinogen, UA: 0.2 E.U./dL
pH, UA: 6.5 (ref 5.0–8.0)

## 2020-09-22 LAB — POCT FASTING CBG KUC MANUAL ENTRY: POCT Glucose (KUC): 102 mg/dL — AB (ref 70–99)

## 2020-09-22 LAB — POCT URINE PREGNANCY: Preg Test, Ur: NEGATIVE

## 2020-09-22 MED ORDER — MECLIZINE HCL 25 MG PO TABS: 25.0000 mg | ORAL_TABLET | Freq: Three times a day (TID) | ORAL | 0 refills | Status: DC | PRN

## 2020-09-22 NOTE — ED Triage Notes (Signed)
Pt c/o dizziness last night and abdominal cramping and slight cough this am. States had a neg home covid test.

## 2020-09-23 ENCOUNTER — Telehealth: Payer: Medicaid Other | Admitting: Physician Assistant

## 2020-09-23 DIAGNOSIS — R42 Dizziness and giddiness: Secondary | ICD-10-CM | POA: Diagnosis not present

## 2020-09-23 LAB — COMPREHENSIVE METABOLIC PANEL
ALT: 50 IU/L — ABNORMAL HIGH (ref 0–32)
AST: 37 IU/L (ref 0–40)
Albumin/Globulin Ratio: 1.9 (ref 1.2–2.2)
Albumin: 4.7 g/dL (ref 3.9–5.0)
Alkaline Phosphatase: 86 IU/L (ref 44–121)
BUN/Creatinine Ratio: 9 (ref 9–23)
BUN: 7 mg/dL (ref 6–20)
Bilirubin Total: 0.5 mg/dL (ref 0.0–1.2)
CO2: 22 mmol/L (ref 20–29)
Calcium: 9.7 mg/dL (ref 8.7–10.2)
Chloride: 105 mmol/L (ref 96–106)
Creatinine, Ser: 0.76 mg/dL (ref 0.57–1.00)
Globulin, Total: 2.5 g/dL (ref 1.5–4.5)
Glucose: 96 mg/dL (ref 65–99)
Potassium: 4.4 mmol/L (ref 3.5–5.2)
Sodium: 142 mmol/L (ref 134–144)
Total Protein: 7.2 g/dL (ref 6.0–8.5)
eGFR: 114 mL/min/{1.73_m2} (ref >59)

## 2020-09-23 LAB — CBC WITH DIFFERENTIAL/PLATELET
Basophils Absolute: 0.1 10*3/uL (ref 0.0–0.2)
Basos: 1 %
EOS (ABSOLUTE): 0.2 10*3/uL (ref 0.0–0.4)
Eos: 2 %
Hematocrit: 44.9 % (ref 34.0–46.6)
Hemoglobin: 14.8 g/dL (ref 11.1–15.9)
Immature Grans (Abs): 0.1 10*3/uL (ref 0.0–0.1)
Immature Granulocytes: 1 %
Lymphocytes Absolute: 1.8 10*3/uL (ref 0.7–3.1)
Lymphs: 16 %
MCH: 27.2 pg (ref 26.6–33.0)
MCHC: 33 g/dL (ref 31.5–35.7)
MCV: 83 fL (ref 79–97)
Monocytes Absolute: 0.7 10*3/uL (ref 0.1–0.9)
Monocytes: 6 %
Neutrophils Absolute: 8.3 10*3/uL — ABNORMAL HIGH (ref 1.4–7.0)
Neutrophils: 74 %
Platelets: 339 10*3/uL (ref 150–450)
RBC: 5.44 x10E6/uL — ABNORMAL HIGH (ref 3.77–5.28)
RDW: 13.5 % (ref 11.7–15.4)
WBC: 11.1 10*3/uL — ABNORMAL HIGH (ref 3.4–10.8)

## 2020-09-23 LAB — TSH: TSH: 1.75 u[IU]/mL (ref 0.450–4.500)

## 2020-09-23 MED ORDER — SCOPOLAMINE 1 MG/3DAYS TD PT72: 1.0000 | MEDICATED_PATCH | TRANSDERMAL | 0 refills | Status: DC

## 2020-09-23 NOTE — Patient Instructions (Signed)
Katelyn Aguilar, thank you for joining Katelyn Loveless, PA-C for today's virtual visit.  While this provider is not your primary care provider (PCP), if your PCP is located in our provider database this encounter information will be shared with them immediately following your visit.  Consent: (Patient) Katelyn Aguilar provided verbal consent for this virtual visit at the beginning of the encounter.  Current Medications:  Current Outpatient Medications:    scopolamine (TRANSDERM-SCOP) 1 MG/3DAYS, Place 1 patch (1.5 mg total) onto the skin every 3 (three) days., Disp: 4 patch, Rfl: 0   acetaminophen (TYLENOL) 325 MG tablet, Take 650 mg by mouth every 6 (six) hours as needed for moderate pain., Disp: , Rfl:    ARIPiprazole (ABILIFY) 5 MG tablet, Take 1 tablet (5 mg total) by mouth daily., Disp: 90 tablet, Rfl: 0   calcium carbonate (TUMS - DOSED IN MG ELEMENTAL CALCIUM) 500 MG chewable tablet, Chew 1 tablet by mouth daily as needed for indigestion or heartburn., Disp: , Rfl:    cetirizine (ZYRTEC) 10 MG tablet, Take 1 tablet (10 mg total) by mouth daily., Disp: 30 tablet, Rfl: 11   escitalopram (LEXAPRO) 10 MG tablet, TAKE 1 TABLET(10 MG) BY MOUTH DAILY, Disp: 30 tablet, Rfl: 0   hydrOXYzine (ATARAX/VISTARIL) 10 MG tablet, Take 1 tablet (10 mg total) by mouth 2 (two) times daily as needed. May increase to three times a day after 1 week if needed, Disp: 90 tablet, Rfl: 0   meclizine (ANTIVERT) 25 MG tablet, Take 1 tablet (25 mg total) by mouth 3 (three) times daily as needed for dizziness. May cause drowsiness, Disp: 30 tablet, Rfl: 0   metoCLOPramide (REGLAN) 10 MG tablet, Take 1 tablet (10 mg total) by mouth 4 (four) times daily., Disp: 120 tablet, Rfl: 3   pantoprazole (PROTONIX) 40 MG tablet, Take 1 tablet (40 mg total) by mouth 2 (two) times daily., Disp: 60 tablet, Rfl: 3   Medications ordered in this encounter:  Meds ordered this encounter  Medications   scopolamine (TRANSDERM-SCOP)  1 MG/3DAYS    Sig: Place 1 patch (1.5 mg total) onto the skin every 3 (three) days.    Dispense:  4 patch    Refill:  0    Order Specific Question:   Supervising Provider    Answer:   Hyacinth Meeker, BRIAN [[id number]]     *If you need refills on other medications prior to your next appointment, please contact your pharmacy*  Follow-Up: Call back or seek an in-person evaluation if the symptoms worsen or if the condition fails to improve as anticipated.  Other Instructions How to Perform the Epley Maneuver The Epley maneuver is an exercise that relieves symptoms of vertigo. Vertigo is the feeling that you or your surroundings are moving when they are not. When you feel vertigo, you may feel like the room is spinning and may have trouble walking. The Epley maneuver is used for a type of vertigo caused by a calcium deposit in a part of the inner ear. The maneuver involves changing headpositions to help the deposit move out of the area. You can do this maneuver at home whenever you have symptoms of vertigo. You canrepeat it in 24 hours if your vertigo has not gone away. Even though the Epley maneuver may relieve your vertigo for a few weeks, it is possible that your symptoms will return. This maneuver relieves vertigo, but itdoes not relieve dizziness. What are the risks? If it is done correctly, the Epley maneuver  is considered safe. Sometimes it can lead to dizziness or nausea that goes away after a short time. If you develop other symptoms--such as changes in vision, weakness, or numbness--stopdoing the maneuver and call your health care provider. Supplies needed: A bed or table. A pillow. How to do the Epley maneuver     Sit on the edge of a bed or table with your back straight and your legs extended or hanging over the edge of the bed or table. Turn your head halfway toward the affected ear or side as told by your health care provider. Lie backward quickly with your head turned until you are lying  flat on your back. Your head should dangle (head-hanging position). You may want to position a pillow under your shoulders. Hold this position for at least 30 seconds. If you feel dizzy or have symptoms of vertigo, continue to hold the position until the symptoms stop. Turn your head to the opposite direction until your unaffected ear is facing down. Your head should continue to dangle. Hold this position for at least 30 seconds. If you feel dizzy or have symptoms of vertigo, continue to hold the position until the symptoms stop. Turn your whole body to the same side as your head so that you are positioned on your side. Your head will now be nearly facedown and no longer needs to dangle. Hold for at least 30 seconds. If you feel dizzy or have symptoms of vertigo, continue to hold the position until the symptoms stop. Sit back up. You can repeat the maneuver in 24 hours if your vertigo does not go away. Follow these instructions at home: For 24 hours after doing the Epley maneuver: Keep your head in an upright position. When lying down to sleep or rest, keep your head raised (elevated) with two or more pillows. Avoid excessive neck movements. Activity Do not drive or use machinery if you feel dizzy. After doing the Epley maneuver, return to your normal activities as told by your health care provider. Ask your health care provider what activities are safe for you. General instructions Drink enough fluid to keep your urine pale yellow. Do not drink alcohol. Take over-the-counter and prescription medicines only as told by your health care provider. Keep all follow-up visits. This is important. Preventing vertigo symptoms Ask your health care provider if there is anything you should do at home to prevent vertigo. He or she may recommend that you: Keep your head elevated with two or more pillows while you sleep. Do not sleep on the side of your affected ear. Get up slowly from bed. Avoid sudden  movements during the day. Avoid extreme head positions or movement, such as looking up or bending over. Contact a health care provider if: Your vertigo gets worse. You have other symptoms, including: Nausea. Vomiting. Headache. Get help right away if you: Have vision changes. Have a headache or neck pain that is severe or getting worse. Cannot stop vomiting. Have new numbness or weakness in any part of your body. These symptoms may represent a serious problem that is an emergency. Do not wait to see if the symptoms will go away. Get medical help right away. Call your local emergency services (911 in the [place] Do not drive yourself to the hospital. Summary Vertigo is the feeling that you or your surroundings are moving when they are not. The Epley maneuver is an exercise that relieves symptoms of vertigo. If the Epley maneuver is done correctly, it is considered  safe. This information is not intended to replace advice given to you by your health care provider. Make sure you discuss any questions you have with your healthcare provider. Document Revised: 12/16/2019 Document Reviewed: 12/16/2019 Elsevier Patient Education  2022 [employer].    If you have been instructed to have an in-person evaluation today at a local Urgent Care facility, please use the link below. It will take you to a list of all of our available [facility], including address, phone number and hours of operation. Please do not delay care.  [facility]  If you or a family member do not have a primary care provider, use the link below to schedule a visit and establish care. When you choose a [facility] primary care physician or advanced practice provider, you gain a long-term partner in health. Find a Primary Care Provider  Learn more about [employer]'s in-office and virtual care options: [facility] Get Care Now

## 2020-09-23 NOTE — Progress Notes (Signed)
Virtual Visit Consent   [name], you are scheduled for a virtual visit with a [facility] provider today.     Just as with appointments in the office, your consent must be obtained to participate.  Your consent will be active for this visit and any virtual visit you may have with one of our providers in the next 365 days.     If you have a MyChart account, a copy of this consent can be sent to you electronically.  All virtual visits are billed to your insurance company just like a traditional visit in the office.    As this is a virtual visit, video technology does not allow for your provider to perform a traditional examination.  This may limit your provider's ability to fully assess your condition.  If your provider identifies any concerns that need to be evaluated in person or the need to arrange testing (such as labs, EKG, etc.), we will make arrangements to do so.     Although advances in technology are sophisticated, we cannot ensure that it will always work on either your end or our end.  If the connection with a video visit is poor, the visit may have to be switched to a telephone visit.  With either a video or telephone visit, we are not always able to ensure that we have a secure connection.     I need to obtain your verbal consent now.   Are you willing to proceed with your visit today?    ALANEE TING has provided verbal consent on 09/23/2020 for a virtual visit (video or telephone).   [name], PA-C   Date: 09/23/2020 7:58 PM   Virtual Visit via Video Note   I, [name], connected with  [name]  ([id number], October 06, 2000) on 09/23/20 at  7:15 PM EDT by a video-enabled telemedicine application and verified that I am speaking with the correct person using two identifiers.  Location: Patient: Virtual Visit Location Patient: Home Provider: Virtual Visit Location Provider: Home Office   I discussed the limitations of evaluation and  management by telemedicine and the availability of in person appointments. The patient expressed understanding and agreed to proceed.    History of Present Illness: Katelyn Aguilar is a 20 y.o. who identifies as a female who was assigned female at birth, and is being seen today for dizziness.  HPI: Dizziness This is a new problem. The current episode started in the past 7 days. The problem has been unchanged. Associated symptoms include nausea and vertigo. Pertinent negatives include no congestion, coughing, fatigue, fever, headaches, sore throat or visual change. The symptoms are aggravated by bending. She has tried rest, relaxation and position changes (meclizine) for the symptoms. The treatment provided mild relief.   She was seen at [facility] yesterday and started on Meclizine 25mg  TID. She reports that this has helped her nausea and vomiting but she is still having the dizziness. Mostly with head movements.   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   Tachycardia 02/28/2020   Fatigue 02/28/2020   Screen for STD (sexually transmitted disease) 01/12/2020   Irregular menses 12/04/2019   Dizziness 04/22/2019   Cramp in limb 04/10/2019  Simple tics 11/17/2018   Involuntary movements 10/21/2018   Encounter for birth control 10/21/2018    Allergies: No Known Allergies Medications:  Current Outpatient Medications:    scopolamine (TRANSDERM-SCOP) 1 MG/3DAYS, Place 1 patch (1.5 mg total) onto the skin every 3 (three) days., Disp: 4 patch, Rfl: 0   acetaminophen (TYLENOL) 325 MG tablet, Take 650 mg by mouth every 6 (six) hours as needed for moderate pain., Disp: , Rfl:    ARIPiprazole (ABILIFY) 5 MG tablet, Take 1 tablet (5 mg total) by mouth  daily., Disp: 90 tablet, Rfl: 0   calcium carbonate (TUMS - DOSED IN MG ELEMENTAL CALCIUM) 500 MG chewable tablet, Chew 1 tablet by mouth daily as needed for indigestion or heartburn., Disp: , Rfl:    cetirizine (ZYRTEC) 10 MG tablet, Take 1 tablet (10 mg total) by mouth daily., Disp: 30 tablet, Rfl: 11   escitalopram (LEXAPRO) 10 MG tablet, TAKE 1 TABLET(10 MG) BY MOUTH DAILY, Disp: 30 tablet, Rfl: 0   hydrOXYzine (ATARAX/VISTARIL) 10 MG tablet, Take 1 tablet (10 mg total) by mouth 2 (two) times daily as needed. May increase to three times a day after 1 week if needed, Disp: 90 tablet, Rfl: 0   meclizine (ANTIVERT) 25 MG tablet, Take 1 tablet (25 mg total) by mouth 3 (three) times daily as needed for dizziness. May cause drowsiness, Disp: 30 tablet, Rfl: 0   metoCLOPramide (REGLAN) 10 MG tablet, Take 1 tablet (10 mg total) by mouth 4 (four) times daily., Disp: 120 tablet, Rfl: 3   pantoprazole (PROTONIX) 40 MG tablet, Take 1 tablet (40 mg total) by mouth 2 (two) times daily., Disp: 60 tablet, Rfl: 3  Observations/Objective: Patient is well-developed, well-nourished in no acute distress.  Resting comfortably at home.  Head is normocephalic, atraumatic.  No labored breathing.  Speech is clear and coherent with logical content.  Patient is alert and oriented at baseline.    Assessment and Plan: 1. Vertigo - scopolamine (TRANSDERM-SCOP) 1 MG/3DAYS; Place 1 patch (1.5 mg total) onto the skin every 3 (three) days.  Dispense: 4 patch; Refill: 0 - Will add scopolamine patches for dizziness - Epley maneuver provided via AVS - Advised to follow up with PCP as well  Follow Up Instructions: I discussed the assessment and treatment plan with the patient. The patient was provided an opportunity to ask questions and all were answered. The patient agreed with the plan and demonstrated an understanding of the instructions.  A copy of instructions were sent to the patient via MyChart.  The patient was  advised to call back or seek an in-person evaluation if the symptoms worsen or if the condition fails to improve as anticipated.  Time:  I spent 13 minutes with the patient via telehealth technology discussing the above problems/concerns.    [name], PA-C

## 2020-09-24 NOTE — ED Provider Notes (Signed)
[facility]    CSN: [id number] Arrival date & time: 09/22/20  1246      History   Chief Complaint Chief Complaint  Patient presents with   Dizziness    HPI Katelyn Aguilar is a 20 y.o. female.   Patient presenting today with dizziness that is acute on chronic for her and she states has been occurring for several months.  She is also having some intermittent abdominal cramping and slight cough which she also states is not abnormal for her.  She states her anxiety oftentimes presents with the symptoms other than the cough.  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  She has so far not taking anything for symptoms.  She has a past medical history significant for seasonal allergies, anxiety, tachycardia, dizziness of uncertain cause, GERD, pyelonephritis.  She is so far not taking anything over-the-counter for symptoms.  Home COVID test negative.     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   Tachycardia 02/28/2020   Fatigue 02/28/2020   Screen for STD (sexually transmitted disease) 01/12/2020   Irregular menses 12/04/2019   Dizziness 04/22/2019   Cramp in limb 04/10/2019   Simple tics 11/17/2018   Involuntary movements 10/21/2018   Encounter for birth control 10/21/2018    Past  Surgical History:  Procedure Laterality Date   BIOPSY  04/19/2020   Procedure: BIOPSY;  Surgeon: Tressia Danas, MD;  Location: [facility];  Service: Gastroenterology;;   ESOPHAGOGASTRODUODENOSCOPY     ESOPHAGOGASTRODUODENOSCOPY (EGD) WITH PROPOFOL N/A 04/19/2020   Procedure: ESOPHAGOGASTRODUODENOSCOPY (EGD) WITH PROPOFOL;  Surgeon: Tressia Danas, MD;  Location: [facility];  Service: Gastroenterology;  Laterality: N/A;   INCISION AND DRAINAGE ABSCESS N/A 04/26/2020   Procedure: excision of pilonidal cyst with open packing;  Surgeon: Darnell Level, MD;  Location: [facility];  Service: General;  Laterality: N/A;   NO PAST SURGERIES      OB History   No obstetric history on file.      Home Medications    Prior to Admission medications   Medication Sig Start Date End Date Taking? Authorizing Provider  meclizine (ANTIVERT) 25 MG tablet Take 1 tablet (25 mg total) by mouth 3 (three) times daily as needed for dizziness. May cause drowsiness 09/22/20  Yes Particia Nearing, PA-C  acetaminophen (TYLENOL) 325 MG tablet Take 650 mg by mouth every 6 (six) hours as needed for moderate pain.    [provider]  ARIPiprazole (ABILIFY) 5 MG tablet Take 1 tablet (5 mg total) by mouth daily. 05/03/20   Levert Feinstein, MD  calcium carbonate (TUMS - DOSED IN MG ELEMENTAL CALCIUM) 500 MG chewable tablet Chew 1 tablet by mouth daily as needed for indigestion or heartburn.    [provider]  cetirizine (ZYRTEC) 10 MG  tablet Take 1 tablet (10 mg total) by mouth daily. 11/10/19   Dana Allan, MD  escitalopram (LEXAPRO) 10 MG tablet TAKE 1 TABLET(10 MG) BY MOUTH DAILY 08/24/20   Dana Allan, MD  hydrOXYzine (ATARAX/VISTARIL) 10 MG tablet Take 1 tablet (10 mg total) by mouth 2 (two) times daily as needed. May increase to three times a day after 1 week if needed 05/17/20   Dana Allan, MD  metoCLOPramide (REGLAN) 10 MG tablet Take 1 tablet (10 mg total) by mouth 4 (four) times daily. 08/04/20    Unk Lightning, PA  pantoprazole (PROTONIX) 40 MG tablet Take 1 tablet (40 mg total) by mouth 2 (two) times daily. 03/09/20   Tressia Danas, MD  scopolamine (TRANSDERM-SCOP) 1 MG/3DAYS Place 1 patch (1.5 mg total) onto the skin every 3 (three) days. 09/23/20   [name], PA-C    Family History Family History  Problem Relation Age of Onset   COPD Mother    Depression Mother    Cancer Mother        blood cancer   Diabetes Other    Colon cancer Neg Hx    Esophageal cancer Neg Hx    Pancreatic cancer Neg Hx    Stomach cancer Neg Hx     Social History Social History   Tobacco Use   Smoking status: Never    Passive exposure: Yes   Smokeless tobacco: Never  Vaping Use   Vaping Use: Former  Substance Use Topics   Alcohol use: Yes    Comment: occasional   Drug use: No     Allergies   Patient has no known allergies.   Review of Systems Review of Systems PER HPI   Physical Exam Triage Vital Signs ED Triage Vitals  Enc Vitals Group     BP 09/22/20 1257 (!) 166/86     Pulse Rate 09/22/20 1257 (!) 107     Resp 09/22/20 1257 18     Temp 09/22/20 1257 98.3 F (36.8 C)     Temp Source 09/22/20 1257 Oral     SpO2 09/22/20 1257 96 %     Weight --      Height --      Head Circumference --      Peak Flow --      Pain Score 09/22/20 1258 2     Pain Loc --      Pain Edu? --      Excl. in GC? --    No data found.  Updated Vital Signs BP (!) 166/86 (BP Location: Left Arm)   Pulse (!) 107   Temp 98.3 F (36.8 C) (Oral)   Resp 18   LMP  (LMP Unknown) Comment: States on Depo up til last month  SpO2 96%   Visual Acuity Right Eye Distance:   Left Eye Distance:   Bilateral Distance:    Right Eye Near:   Left Eye Near:    Bilateral Near:     Physical Exam Vitals and nursing note reviewed.  Constitutional:      Appearance: Normal appearance. She is not ill-appearing.  HENT:     Head: Atraumatic.     Nose: Nose normal.     Mouth/Throat:      Mouth: Mucous membranes are moist.     Pharynx: Oropharynx is clear. No posterior oropharyngeal erythema.  Eyes:     Extraocular Movements: Extraocular movements intact.     Conjunctiva/sclera: Conjunctivae normal.  Cardiovascular:  Rate and Rhythm: Regular rhythm. Tachycardia present.     Heart sounds: Normal heart sounds.  Pulmonary:     Effort: Pulmonary effort is normal.     Breath sounds: Normal breath sounds. No wheezing or rales.  Abdominal:     General: Bowel sounds are normal. There is no distension.     Palpations: Abdomen is soft.     Tenderness: There is no abdominal tenderness. There is no right CVA tenderness, left CVA tenderness or guarding.  Musculoskeletal:        General: Normal range of motion.     Cervical back: Normal range of motion and neck supple.  Skin:    General: Skin is warm and dry.  Neurological:     General: No focal deficit present.     Mental Status: She is alert and oriented to person, place, and time.     Sensory: No sensory deficit.     Motor: No weakness.     Coordination: Coordination normal.     Gait: Gait normal.  Psychiatric:        Mood and Affect: Mood normal.        Thought Content: Thought content normal.        Judgment: Judgment normal.   UC Treatments / Results  Labs (all labs ordered are listed, but only abnormal results are displayed) Labs Reviewed  CBC WITH DIFFERENTIAL/PLATELET - Abnormal; Notable for the following components:      Result Value   WBC 11.1 (*)    RBC 5.44 (*)    Neutrophils Absolute 8.3 (*)    All other components within normal limits   Narrative:    Performed at:  [address] - [facility] [address], [place], [place]  [id number] Lab Director: Jolene SchimkeSanjai Nagendra MD, Phone:  [phone]  COMPREHENSIVE METABOLIC PANEL - Abnormal; Notable for the following components:   ALT 50 (*)    All other components within normal limits   Narrative:    Performed at:  [address] - [facility] [address],  [place], [place]  [id number] Lab Director: Jolene SchimkeSanjai Nagendra MD, Phone:  [phone]  POCT URINALYSIS DIP (MANUAL ENTRY) - Abnormal; Notable for the following components:   Clarity, UA cloudy (*)    Leukocytes, UA Small (1+) (*)    All other components within normal limits  POCT FASTING CBG [facility] MANUAL ENTRY - Abnormal; Notable for the following components:   POCT Glucose ([facility]) 102 (*)    All other components within normal limits  TSH   Narrative:    Performed at:  [address] - [facility]  [address], [place], [place]  [id number] Lab Director: Jolene SchimkeSanjai Nagendra MD, Phone:  [phone]  POCT URINE PREGNANCY    EKG   Radiology No results found.  Procedures Procedures (including critical care time)  Medications Ordered in UC Medications - No data to display  Initial Impression / Assessment and Plan / UC Course  I have reviewed the triage vital signs and the nursing notes.  Pertinent labs & imaging results that were available during my care of the patient were reviewed by me and considered in my medical decision making (see chart for details).     Mild intermittent dizziness, continuous tachycardia which she states is baseline for her and followed by her primary care provider, suspected to be due to her anxiety conditions.  She declines an EKG today as she has had multiple through her primary care provider that have shown nothing but sinus tachycardia.  We will perform  basic labs, point-of-care glucose was fairly benign, urinalysis and urine pregnancy negative.  Trial meclizine as sometimes the dizziness does seem to be spinning in nature to see if this helps with symptoms, but suspect more related to her anxiety and tachycardia.  Close PCP follow-up recommended, possibly would benefit from a Holter monitor at some point.  Work note given.  Return for acutely worsening symptoms.  Final Clinical Impressions(s) / UC Diagnoses   Final diagnoses:  Tachycardia  Dizziness  Generalized  abdominal pain  Cough   Discharge Instructions   None    ED Prescriptions     Medication Sig Dispense Auth. Provider   meclizine (ANTIVERT) 25 MG tablet Take 1 tablet (25 mg total) by mouth 3 (three) times daily as needed for dizziness. May cause drowsiness 30 tablet Particia Nearing, [place]      PDMP not reviewed this encounter.   Particia Nearing, [place] 09/24/20 1026

## 2020-09-25 ENCOUNTER — Encounter (HOSPITAL_COMMUNITY): Payer: Self-pay | Admitting: Emergency Medicine

## 2020-09-25 ENCOUNTER — Emergency Department (HOSPITAL_COMMUNITY)
Admission: EM | Admit: 2020-09-25 | Discharge: 2020-09-26 | Disposition: A | Discharge status: Home / Self Care | Payer: Medicaid Other | Attending: Emergency Medicine | Admitting: Emergency Medicine

## 2020-09-25 ENCOUNTER — Other Ambulatory Visit: Payer: Self-pay

## 2020-09-25 DIAGNOSIS — R000 Tachycardia, unspecified: Secondary | ICD-10-CM | POA: Diagnosis not present

## 2020-09-25 DIAGNOSIS — N9489 Other specified conditions associated with female genital organs and menstrual cycle: Secondary | ICD-10-CM | POA: Insufficient documentation

## 2020-09-25 DIAGNOSIS — Z7722 Contact with and (suspected) exposure to environmental tobacco smoke (acute) (chronic): Secondary | ICD-10-CM | POA: Insufficient documentation

## 2020-09-25 DIAGNOSIS — R112 Nausea with vomiting, unspecified: Secondary | ICD-10-CM | POA: Diagnosis not present

## 2020-09-25 LAB — URINALYSIS, ROUTINE W REFLEX MICROSCOPIC
Bilirubin Urine: NEGATIVE
Glucose, UA: NEGATIVE mg/dL
Hgb urine dipstick: NEGATIVE
Ketones, ur: NEGATIVE mg/dL
Nitrite: NEGATIVE
Protein, ur: 30 mg/dL — AB
Specific Gravity, Urine: 1.029 (ref 1.005–1.030)
pH: 5 (ref 5.0–8.0)

## 2020-09-25 LAB — COMPREHENSIVE METABOLIC PANEL
ALT: 47 U/L — ABNORMAL HIGH (ref 0–44)
AST: 36 U/L (ref 15–41)
Albumin: 4.1 g/dL (ref 3.5–5.0)
Alkaline Phosphatase: 69 U/L (ref 38–126)
Anion gap: 12 (ref 5–15)
BUN: 15 mg/dL (ref 6–20)
CO2: 20 mmol/L — ABNORMAL LOW (ref 22–32)
Calcium: 9.7 mg/dL (ref 8.9–10.3)
Chloride: 105 mmol/L (ref 98–111)
Creatinine, Ser: 0.89 mg/dL (ref 0.44–1.00)
GFR, Estimated: >60 mL/min (ref >60)
Glucose, Bld: 93 mg/dL (ref 70–99)
Potassium: 3.8 mmol/L (ref 3.5–5.1)
Sodium: 137 mmol/L (ref 135–145)
Total Bilirubin: 1.2 mg/dL (ref 0.3–1.2)
Total Protein: 7.5 g/dL (ref 6.5–8.1)

## 2020-09-25 LAB — CBC WITH DIFFERENTIAL/PLATELET
Abs Immature Granulocytes: 0.06 10*3/uL (ref 0.00–0.07)
Basophils Absolute: 0.1 10*3/uL (ref 0.0–0.1)
Basophils Relative: 0 %
Eosinophils Absolute: 0.1 10*3/uL (ref 0.0–0.5)
Eosinophils Relative: 1 %
HCT: 43.9 % (ref 36.0–46.0)
Hemoglobin: 14.5 g/dL (ref 12.0–15.0)
Immature Granulocytes: 1 %
Lymphocytes Relative: 15 %
Lymphs Abs: 2 10*3/uL (ref 0.7–4.0)
MCH: 27.6 pg (ref 26.0–34.0)
MCHC: 33 g/dL (ref 30.0–36.0)
MCV: 83.5 fL (ref 80.0–100.0)
Monocytes Absolute: 0.9 10*3/uL (ref 0.1–1.0)
Monocytes Relative: 7 %
Neutro Abs: 9.8 10*3/uL — ABNORMAL HIGH (ref 1.7–7.7)
Neutrophils Relative %: 76 %
Platelets: 328 10*3/uL (ref 150–400)
RBC: 5.26 MIL/uL — ABNORMAL HIGH (ref 3.87–5.11)
RDW: 13.3 % (ref 11.5–15.5)
WBC: 12.9 10*3/uL — ABNORMAL HIGH (ref 4.0–10.5)
nRBC: 0 % (ref 0.0–0.2)

## 2020-09-25 LAB — I-STAT BETA HCG BLOOD, ED (MC, WL, AP ONLY): I-stat hCG, quantitative: <5 m[IU]/mL (ref <5)

## 2020-09-25 LAB — LIPASE, BLOOD: Lipase: 27 U/L (ref 11–51)

## 2020-09-25 NOTE — ED Triage Notes (Signed)
Pt reports emesis all day, states the previous dizziness has resided.

## 2020-09-25 NOTE — ED Provider Notes (Signed)
Emergency Medicine Provider Triage Evaluation Note  Katelyn Aguilar , a 20 y.o. female  was evaluated in triage.  Pt complains of vomiting all day.  States recently treated for vertigo at [facility].  Dizziness is better, but vomiting worse.  She denies fever, chills, diarrhea, abdominal pain.  No recent travel, changes in diet, abnormal food intake, or sick contacts with similar.  Does have scopolamine patch behind right ear, no other intervention tried.  Review of Systems  Positive: vomiting Negative: Diarrhea, fever  Physical Exam  BP (!) 135/92 (BP Location: Right Arm)   Pulse (!) 118   Temp 98.5 F (36.9 C) (Oral)   Resp 16   LMP  (LMP Unknown) Comment: States on Depo up til last month  SpO2 100%   Gen:   Awake, no distress   Resp:  Normal effort  MSK:   Moves extremities without difficulty  Other:  Abdomen soft, non-tender  Medical Decision Making  Medically screening exam initiated at 10:37 PM.  Appropriate orders placed.  Katelyn Aguilar was informed that the remainder of the evaluation will be completed by another provider, this initial triage assessment does not replace that evaluation, and the importance of remaining in the ED until their evaluation is complete.  Offered zofran in triage which patient refused as it "does not work".  Labs ordered.   Garlon Hatchet, PA-C 09/25/20 2240    Alvira Monday, MD 09/26/20 [phone]

## 2020-09-26 MED ORDER — PROCHLORPERAZINE MALEATE 10 MG PO TABS: 10.0000 mg | ORAL_TABLET | Freq: Two times a day (BID) | ORAL | 0 refills | Status: DC | PRN

## 2020-09-26 MED ORDER — PROCHLORPERAZINE EDISYLATE 10 MG/2ML IJ SOLN: 10.0000 mg | Freq: Once | INTRAMUSCULAR | Status: DC

## 2020-09-26 MED ORDER — PROCHLORPERAZINE EDISYLATE 10 MG/2ML IJ SOLN
10.0000 mg | Freq: Once | INTRAMUSCULAR | Status: AC
  Administered 2020-09-26: 10 mg via INTRAMUSCULAR
  Filled 2020-09-26: qty 2

## 2020-09-26 MED ORDER — LACTATED RINGERS IV BOLUS: 1000.0000 mL | Freq: Once | INTRAVENOUS | Status: DC

## 2020-09-26 NOTE — ED Notes (Signed)
Pt given ice water. Tolerating PO liquids well at this time. Denies N/V.

## 2020-09-26 NOTE — ED Notes (Signed)
Pt verbalizes understanding of discharge instructions. Opportunity for questions and answers were provided. Pt discharged from the ED.   ?

## 2020-09-26 NOTE — ED Provider Notes (Signed)
Patient improved with interventions here in the ER.  Tolerating PO hydration, HR improved to approx. 100 BPM.  Given prescription of antiemetic medication advised outpatient follow-up.   Cheryll Cockayne, MD 09/28/20 [phone]

## 2020-09-26 NOTE — ED Provider Notes (Signed)
[facility] EMERGENCY DEPARTMENT Provider Note   CSN: [id number] Arrival date & time: 09/25/20  2121     History Chief Complaint  Patient presents with   Emesis    Katelyn Aguilar is a 20 y.o. female.  The history is provided by the patient.  Emesis She has history of GERD and comes in because of vomiting.  She had been diagnosed with vertigo and has been treated with topical scopolamine.  Vertigo had improved.  Yesterday morning, she started having repeated episodes of emesis and has not been able to hold anything down throughout the day.  She complains of some abdominal soreness from vomiting, but no true abdominal pain.  She denies fever or chills or sweats.  She denies constipation or diarrhea.  She states that she has been diagnosed with gastroparesis and that ondansetron and promethazine have not been effective for her in the past.   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   Tachycardia 02/28/2020   Fatigue 02/28/2020   Screen for STD (sexually transmitted disease) 01/12/2020   Irregular menses 12/04/2019   Dizziness 04/22/2019   Cramp in limb 04/10/2019   Simple tics 11/17/2018   Involuntary movements 10/21/2018   Encounter for birth control 10/21/2018    Past Surgical History:  Procedure Laterality Date   BIOPSY  04/19/2020   Procedure: BIOPSY;  Surgeon: Tressia Danas, MD;  Location: [facility];  Service: Gastroenterology;;   ESOPHAGOGASTRODUODENOSCOPY     ESOPHAGOGASTRODUODENOSCOPY (EGD) WITH PROPOFOL N/A 04/19/2020   Procedure: ESOPHAGOGASTRODUODENOSCOPY (EGD) WITH PROPOFOL;  Surgeon: Tressia Danas, MD;  Location: [facility] ENDOSCOPY;  Service: Gastroenterology;  Laterality: N/A;   INCISION AND DRAINAGE ABSCESS N/A 04/26/2020   Procedure: excision of pilonidal cyst with open packing;  Surgeon: Darnell Level, MD;  Location: [facility] ORS;  Service: General;  Laterality: N/A;   NO PAST SURGERIES       OB History   No obstetric history on file.     Family History  Problem Relation Age of Onset   COPD Mother    Depression Mother    Cancer Mother        blood cancer   Diabetes Other    Colon cancer Neg Hx    Esophageal cancer Neg Hx    Pancreatic cancer Neg Hx    Stomach cancer Neg Hx     Social History   Tobacco Use   Smoking status: Never    Passive exposure: Yes   Smokeless tobacco: Never  Vaping Use   Vaping Use: Former  Substance Use Topics   Alcohol use: Yes    Comment: occasional   Drug use: No    Home Medications Prior to Admission medications   Medication Sig Start Date End Date Taking? Authorizing Provider  acetaminophen (TYLENOL) 325 MG tablet Take 650 mg by mouth every 6 (six) hours as needed for moderate pain.  [provider]  ARIPiprazole (ABILIFY) 5 MG tablet Take 1 tablet (5 mg total) by mouth daily. 05/03/20   Levert Feinstein, MD  calcium carbonate (TUMS - DOSED IN MG ELEMENTAL CALCIUM) 500 MG chewable tablet Chew 1 tablet by mouth daily as needed for indigestion or heartburn.    [provider]  cetirizine (ZYRTEC) 10 MG tablet Take 1 tablet (10 mg total) by mouth daily. 11/10/19   Dana Allan, MD  escitalopram (LEXAPRO) 10 MG tablet TAKE 1 TABLET(10 MG) BY MOUTH DAILY 08/24/20   Dana Allan, MD  hydrOXYzine (ATARAX/VISTARIL) 10 MG tablet Take 1 tablet (10 mg total) by mouth 2 (two) times daily as needed. May increase to three  times a day after 1 week if needed 05/17/20   Dana Allan, MD  meclizine (ANTIVERT) 25 MG tablet Take 1 tablet (25 mg total) by mouth 3 (three) times daily as needed for dizziness. May cause drowsiness 09/22/20   Particia Nearing, PA-C  metoCLOPramide (REGLAN) 10 MG tablet Take 1 tablet (10 mg total) by mouth 4 (four) times daily. 08/04/20   Unk Lightning, PA  pantoprazole (PROTONIX) 40 MG tablet Take 1 tablet (40 mg total) by mouth 2 (two) times daily. 03/09/20   Tressia Danas, MD  scopolamine (TRANSDERM-SCOP) 1 MG/3DAYS Place 1 patch (1.5 mg total) onto the skin every 3 (three) days. 09/23/20   [name], PA-C    Allergies    Patient has no known allergies.  Review of Systems   Review of Systems  Gastrointestinal:  Positive for vomiting.  All other systems reviewed and are negative.  Physical Exam Updated Vital Signs BP (!) 144/86 (BP Location: Right Arm)   Pulse (!) 102   Temp 98.2 F (36.8 C)   Resp 20   LMP  (LMP Unknown) Comment: States on Depo up til last month  SpO2 95%   Physical Exam Vitals and nursing note reviewed.  Morbidly obese 20 year old female, resting comfortably and in no acute distress. Vital signs are significant for mildly elevated blood pressure and mildly elevated heart rate. Oxygen saturation is 95%, which is normal. Head is normocephalic and atraumatic. PERRLA, EOMI. Oropharynx is clear. Neck is nontender and supple without adenopathy or JVD. Back is nontender and there is no CVA tenderness. Lungs are clear without rales, wheezes, or rhonchi. Chest is nontender. Heart has regular rate and rhythm without murmur. Abdomen is soft, flat, nontender without masses or hepatosplenomegaly and peristalsis is hypoactive. Extremities have no cyanosis or edema, full range of motion is present. Skin is warm and dry without rash. Neurologic: Mental status is normal, cranial nerves are intact, moves all extremities equally.  ED Results /  Procedures / Treatments   Labs (all labs ordered are listed, but only abnormal results are displayed) Labs Reviewed  CBC WITH DIFFERENTIAL/PLATELET - Abnormal; Notable for the following components:      Result Value   WBC 12.9 (*)    RBC 5.26 (*)    Neutro Abs 9.8 (*)    All other components within normal limits  COMPREHENSIVE METABOLIC PANEL - Abnormal; Notable for the following components:   CO2 20 (*)    ALT 47 (*)    All other components within normal limits  URINALYSIS, ROUTINE W REFLEX MICROSCOPIC - Abnormal; Notable for the following components:   Color, Urine AMBER (*)    APPearance TURBID (*)    Protein, ur 30 (*)    Leukocytes,Ua MODERATE (*)    Bacteria,  UA RARE (*)    All other components within normal limits  LIPASE, BLOOD  I-STAT BETA HCG BLOOD, ED ([facility], [facility], [facility] ONLY)    EKG EKG Interpretation  Date/Time:  Monday September 26 2020 06:41:56 EDT Ventricular Rate:  133 PR Interval:  116 QRS Duration: 82 QT Interval:  296 QTC Calculation: 440 R Axis:   84 Text Interpretation: Sinus tachycardia Otherwise normal ECG When compared with ECG of 06/06/2020, No significant change was found Confirmed by Dione Booze ([id number]) on 09/26/2020 6:49:54 AM  Procedures Procedures   Medications Ordered in ED Medications  prochlorperazine (COMPAZINE) injection 10 mg (has no administration in time range)    ED Course  I have reviewed the triage vital signs and the nursing notes.  Pertinent lab results that were available during my care of the patient were reviewed by me and considered in my medical decision making (see chart for details).   MDM Rules/Calculators/A&P                         Nausea and vomiting of uncertain cause.  Possible viral gastritis.  Doubt gastroparesis as actual cause of this episode.  Vertigo seems to have been resolved, doubt vomiting secondary to vertigo.  Labs showed minimal elevation of ALT of doubtful clinical significance.  Urine has protein but no  ketones, specific gravity is slightly elevated consistent with early dehydration.  She will be given IV fluids and prochlorperazine.  Prior ECG showed normal QT interval, but will check ECG today to evaluate QTC.  Old records reviewed, and she has no relevant past ED visits.  Unable to establish IV accident, prochlorperazine is given intramuscularly.  ECG shows sinus tachycardia but with normal QTc interval.  Case is signed out to Dr. Audley Hose.  Final Clinical Impression(s) / ED Diagnoses Final diagnoses:  Non-intractable vomiting with nausea, unspecified vomiting type    Rx / DC Orders ED Discharge Orders     None        Dione Booze, MD 09/26/20 [phone]

## 2020-09-26 NOTE — Discharge Instructions (Addendum)
Call your primary care doctor or specialist as discussed in the next 2-3 days.   Return immediately back to the ER if:  Your symptoms worsen within the next 12-24 hours. You develop new symptoms such as new fevers, persistent vomiting, new pain, shortness of breath, or new weakness or numbness, or if you have any other concerns.  

## 2020-09-27 ENCOUNTER — Telehealth: Payer: Self-pay | Admitting: *Deleted

## 2020-09-27 NOTE — Telephone Encounter (Signed)
Transition Care Management Follow-up Telephone Call Date of discharge and from where: 09/26/2020 - [facility] How have you been since you were released from the hospital? "About the same" Any questions or concerns? No  Items Reviewed: Did the pt receive and understand the discharge instructions provided? Yes  Medications obtained and verified? Yes  Other? No  Any new allergies since your discharge? No  Dietary orders reviewed? No Do you have support at home? Yes    Functional Questionnaire: (I = Independent and D = Dependent) ADLs: I  Bathing/Dressing- I  Meal Prep- I  Eating- I  Maintaining continence- I  Transferring/Ambulation- I  Managing Meds- I  Follow up appointments reviewed:  PCP Hospital f/u appt confirmed? Yes  Scheduled to see PCP on 10/06/2020 @ 0920. Specialist Hospital f/u appt confirmed? No   Are transportation arrangements needed? No  If their condition worsens, is the pt aware to call PCP or go to the Emergency Dept.? Yes Was the patient provided with contact information for the PCP's office or ED? Yes Was to pt encouraged to call back with questions or concerns? Yes

## 2020-09-28 ENCOUNTER — Encounter: Payer: Self-pay | Admitting: Emergency Medicine

## 2020-09-28 ENCOUNTER — Emergency Department (HOSPITAL_COMMUNITY): Payer: Medicaid Other

## 2020-09-28 ENCOUNTER — Other Ambulatory Visit: Payer: Self-pay

## 2020-09-28 ENCOUNTER — Ambulatory Visit
Admission: EM | Admit: 2020-09-28 | Discharge: 2020-09-28 | Disposition: A | Discharge status: Home / Self Care | Payer: Medicaid Other | Attending: Internal Medicine | Admitting: Internal Medicine

## 2020-09-28 ENCOUNTER — Encounter (HOSPITAL_COMMUNITY): Payer: Self-pay

## 2020-09-28 ENCOUNTER — Emergency Department (HOSPITAL_COMMUNITY)
Admission: EM | Admit: 2020-09-28 | Discharge: 2020-09-28 | Disposition: A | Discharge status: Home / Self Care | Payer: Medicaid Other | Attending: Emergency Medicine | Admitting: Emergency Medicine

## 2020-09-28 DIAGNOSIS — R072 Precordial pain: Secondary | ICD-10-CM | POA: Insufficient documentation

## 2020-09-28 DIAGNOSIS — R0602 Shortness of breath: Secondary | ICD-10-CM | POA: Insufficient documentation

## 2020-09-28 DIAGNOSIS — R0789 Other chest pain: Secondary | ICD-10-CM | POA: Diagnosis not present

## 2020-09-28 LAB — COMPREHENSIVE METABOLIC PANEL
ALT: 47 U/L — ABNORMAL HIGH (ref 0–44)
AST: 33 U/L (ref 15–41)
Albumin: 4.1 g/dL (ref 3.5–5.0)
Alkaline Phosphatase: 67 U/L (ref 38–126)
Anion gap: 10 (ref 5–15)
BUN: 11 mg/dL (ref 6–20)
CO2: 21 mmol/L — ABNORMAL LOW (ref 22–32)
Calcium: 9.3 mg/dL (ref 8.9–10.3)
Chloride: 111 mmol/L (ref 98–111)
Creatinine, Ser: 0.72 mg/dL (ref 0.44–1.00)
GFR, Estimated: >60 mL/min (ref >60)
Glucose, Bld: 103 mg/dL — ABNORMAL HIGH (ref 70–99)
Potassium: 3.8 mmol/L (ref 3.5–5.1)
Sodium: 142 mmol/L (ref 135–145)
Total Bilirubin: 0.4 mg/dL (ref 0.3–1.2)
Total Protein: 7.3 g/dL (ref 6.5–8.1)

## 2020-09-28 LAB — URINALYSIS, ROUTINE W REFLEX MICROSCOPIC
Bilirubin Urine: NEGATIVE
Glucose, UA: NEGATIVE mg/dL
Hgb urine dipstick: NEGATIVE
Ketones, ur: NEGATIVE mg/dL
Nitrite: NEGATIVE
Protein, ur: NEGATIVE mg/dL
Specific Gravity, Urine: >1.03 — ABNORMAL HIGH (ref 1.005–1.030)
pH: 6 (ref 5.0–8.0)

## 2020-09-28 LAB — CBC WITH DIFFERENTIAL/PLATELET
Abs Immature Granulocytes: 0.07 10*3/uL (ref 0.00–0.07)
Basophils Absolute: 0 10*3/uL (ref 0.0–0.1)
Basophils Relative: 0 %
Eosinophils Absolute: 0.3 10*3/uL (ref 0.0–0.5)
Eosinophils Relative: 2 %
HCT: 39.6 % (ref 36.0–46.0)
Hemoglobin: 13.1 g/dL (ref 12.0–15.0)
Immature Granulocytes: 1 %
Lymphocytes Relative: 22 %
Lymphs Abs: 2.6 10*3/uL (ref 0.7–4.0)
MCH: 27.7 pg (ref 26.0–34.0)
MCHC: 33.1 g/dL (ref 30.0–36.0)
MCV: 83.7 fL (ref 80.0–100.0)
Monocytes Absolute: 1 10*3/uL (ref 0.1–1.0)
Monocytes Relative: 9 %
Neutro Abs: 7.7 10*3/uL (ref 1.7–7.7)
Neutrophils Relative %: 66 %
Platelets: 275 10*3/uL (ref 150–400)
RBC: 4.73 MIL/uL (ref 3.87–5.11)
RDW: 13.2 % (ref 11.5–15.5)
WBC: 11.6 10*3/uL — ABNORMAL HIGH (ref 4.0–10.5)
nRBC: 0 % (ref 0.0–0.2)

## 2020-09-28 LAB — URINALYSIS, MICROSCOPIC (REFLEX): RBC / HPF: NONE SEEN RBC/hpf (ref 0–5)

## 2020-09-28 LAB — TROPONIN I (HIGH SENSITIVITY): Troponin I (High Sensitivity): 4 ng/L (ref <18)

## 2020-09-28 LAB — POC URINE PREG, ED: Preg Test, Ur: NEGATIVE

## 2020-09-28 MED ORDER — KETOROLAC TROMETHAMINE 60 MG/2ML IM SOLN
30.0000 mg | Freq: Once | INTRAMUSCULAR | Status: AC
  Administered 2020-09-28: 30 mg via INTRAMUSCULAR

## 2020-09-28 NOTE — ED Provider Notes (Signed)
[facility]-EMERGENCY DEPT Provider Note   CSN: [id number] Arrival date & time: 09/28/20  0203     History Chief Complaint  Patient presents with   Chest Pain   Arm Pain   Shortness of Breath    Katelyn Aguilar is a 20 y.o. female.  With past medical history of anxiety, GERD, tachycardia who presents emergency department with complaint of chest pain.  She states that chest pain started 15 minutes prior to arrival to the emergency department.  She describes it as intermittent retrosternal pressure that radiates to the left arm.  She states she has had chest pain before but never like this.  Also endorses that she has been short of breath and somewhat lightheaded since onset of chest pain.  States that she feels somewhat anxious however this is different than her anxiety.  During the interview patient states that she is also beginning to have a sensation of "pressure" under her chin and throat, and is concerned that her heart rate is only 93.  Patient reassured. Denies syncope, nausea or vomiting, diaphoresis. Denies cough, lower extremity swelling, recent travel, recent surgery, active malignancy. Denies fever, sick contacts, recent travel.    Chest Pain Associated symptoms: shortness of breath   Associated symptoms: no cough, no palpitations and no weakness   Arm Pain Associated symptoms include chest pain and shortness of breath.  Shortness of Breath Associated symptoms: chest pain   Associated symptoms: no cough    Past Medical History:  Diagnosis Date   Allergic rhinitis    Anxiety    Anxiety 11/17/2018   Dysrhythmia    tachycardia   GERD (gastroesophageal reflux disease)    Nausea and vomiting 04/06/2019   Obesity    Pilonidal abscess 12/04/2019   Pilonidal cyst without abscess 04/18/2020   Pyelonephritis 09/15/2019    Patient Active Problem List   Diagnosis Date Noted   Ingrown toenail of both feet 08/19/2020   Pain of left breast 06/26/2020   BMI  60.0-69.9, adult (HCC) 05/19/2020   Contraception management 05/19/2020   Anxiety with depression 05/09/2020   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/26/2020   Tachycardia 02/28/2020   Fatigue 02/28/2020   Screen for STD (sexually transmitted disease) 01/12/2020   Irregular menses 12/04/2019   Dizziness 04/22/2019   Cramp in limb 04/10/2019   Simple tics 11/17/2018   Involuntary movements 10/21/2018   Encounter for birth control 10/21/2018    Past Surgical History:  Procedure Laterality Date   BIOPSY  04/19/2020   Procedure: BIOPSY;  Surgeon: Tressia Danas, MD;  Location: [facility];  Service: Gastroenterology;;   ESOPHAGOGASTRODUODENOSCOPY     ESOPHAGOGASTRODUODENOSCOPY (EGD) WITH PROPOFOL N/A 04/19/2020   Procedure: ESOPHAGOGASTRODUODENOSCOPY (EGD) WITH PROPOFOL;  Surgeon: Tressia Danas, MD;  Location: [facility];  Service: Gastroenterology;  Laterality: N/A;   INCISION AND DRAINAGE ABSCESS N/A 04/26/2020   Procedure: excision of pilonidal cyst with open packing;  Surgeon: Darnell Level, MD;  Location: [facility];  Service: General;  Laterality: N/A;   NO PAST SURGERIES       OB History   No obstetric history on file.     Family History  Problem Relation Age of Onset   COPD Mother    Depression Mother    Cancer Mother        blood cancer   Diabetes Other    Colon cancer Neg Hx    Esophageal cancer Neg Hx    Pancreatic cancer Neg Hx    Stomach  cancer Neg Hx     Social History   Tobacco Use   Smoking status: Never    Passive exposure: Yes   Smokeless tobacco: Never  Vaping Use   Vaping Use: Former  Substance Use Topics   Alcohol use: Yes    Comment: occasional   Drug use: No    Home Medications Prior to Admission medications   Medication Sig Start Date End Date Taking? Authorizing Provider  acetaminophen (TYLENOL) 325 MG tablet Take 650 mg by mouth every 6 (six) hours as needed for moderate pain.    [provider]  ARIPiprazole (ABILIFY) 5 MG  tablet Take 1 tablet (5 mg total) by mouth daily. 05/03/20   Levert Feinstein, MD  calcium carbonate (TUMS - DOSED IN MG ELEMENTAL CALCIUM) 500 MG chewable tablet Chew 1 tablet by mouth daily as needed for indigestion or heartburn.    [provider]  cetirizine (ZYRTEC) 10 MG tablet Take 1 tablet (10 mg total) by mouth daily. 11/10/19   Dana Allan, MD  escitalopram (LEXAPRO) 10 MG tablet TAKE 1 TABLET(10 MG) BY MOUTH DAILY 08/24/20   Dana Allan, MD  hydrOXYzine (ATARAX/VISTARIL) 10 MG tablet Take 1 tablet (10 mg total) by mouth 2 (two) times daily as needed. May increase to three times a day after 1 week if needed 05/17/20   Dana Allan, MD  meclizine (ANTIVERT) 25 MG tablet Take 1 tablet (25 mg total) by mouth 3 (three) times daily as needed for dizziness. May cause drowsiness 09/22/20   Particia Nearing, PA-C  metoCLOPramide (REGLAN) 10 MG tablet Take 1 tablet (10 mg total) by mouth 4 (four) times daily. 08/04/20   Unk Lightning, PA  pantoprazole (PROTONIX) 40 MG tablet Take 1 tablet (40 mg total) by mouth 2 (two) times daily. 03/09/20   Tressia Danas, MD  prochlorperazine (COMPAZINE) 10 MG tablet Take 1 tablet (10 mg total) by mouth 2 (two) times daily as needed for nausea or vomiting. 09/26/20   Cheryll Cockayne, MD  scopolamine (TRANSDERM-SCOP) 1 MG/3DAYS Place 1 patch (1.5 mg total) onto the skin every 3 (three) days. 09/23/20   [name], PA-C    Allergies    Patient has no known allergies.  Review of Systems   Review of Systems  Constitutional: Negative.   HENT: Negative.    Eyes: Negative.   Respiratory:  Positive for shortness of breath. Negative for cough.   Cardiovascular:  Positive for chest pain. Negative for palpitations and leg swelling.  Gastrointestinal: Negative.   Genitourinary: Negative.   Musculoskeletal: Negative.   Skin: Negative.   Allergic/Immunologic: Negative.   Neurological:  Positive for light-headedness. Negative for syncope and  weakness.  Hematological: Negative.   Psychiatric/Behavioral:  The patient is nervous/anxious.    Physical Exam Updated Vital Signs BP (!) 167/106 (BP Location: Right Arm)   Pulse (!) 108   Temp 98.5 F (36.9 C) (Oral)   Resp 18   LMP  (LMP Unknown) Comment: States on Depo up til last month  SpO2 99%   Physical Exam Vitals and nursing note reviewed.  Constitutional:      General: She is not in acute distress.    Appearance: She is obese.  HENT:     Head: Normocephalic.  Eyes:     Extraocular Movements: Extraocular movements intact.     Pupils: Pupils are equal, round, and reactive to light.  Neck:     Vascular: No JVD.     Trachea: No tracheal  deviation.  Cardiovascular:     Rate and Rhythm: Normal rate and regular rhythm.     Pulses: Normal pulses.          Radial pulses are 2+ on the right side and 2+ on the left side.       Dorsalis pedis pulses are 2+ on the right side and 2+ on the left side.     Heart sounds: Normal heart sounds. No murmur heard. Pulmonary:     Effort: Pulmonary effort is normal. No tachypnea or respiratory distress.     Breath sounds: Normal breath sounds. No stridor.  Chest:     Chest wall: Tenderness present.  Abdominal:     General: Bowel sounds are normal.     Palpations: Abdomen is soft.     Tenderness: There is no abdominal tenderness.  Musculoskeletal:     Cervical back: Normal range of motion and neck supple.     Right lower leg: No edema.     Left lower leg: No edema.  Skin:    General: Skin is warm and dry.     Capillary Refill: Capillary refill takes less than 2 seconds.  Neurological:     General: No focal deficit present.     Mental Status: She is alert and oriented to person, place, and time.  Psychiatric:        Mood and Affect: Mood is anxious.    ED Results / Procedures / Treatments   Labs (all labs ordered are listed, but only abnormal results are displayed) Labs Reviewed  CBC WITH DIFFERENTIAL/PLATELET - Abnormal;  Notable for the following components:      Result Value   WBC 11.6 (*)    All other components within normal limits  COMPREHENSIVE METABOLIC PANEL - Abnormal; Notable for the following components:   CO2 21 (*)    Glucose, Bld 103 (*)    ALT 47 (*)    All other components within normal limits  URINALYSIS, ROUTINE W REFLEX MICROSCOPIC - Abnormal; Notable for the following components:   APPearance HAZY (*)    Specific Gravity, Urine >1.030 (*)    Leukocytes,Ua TRACE (*)    All other components within normal limits  URINALYSIS, MICROSCOPIC (REFLEX) - Abnormal; Notable for the following components:   Bacteria, UA FEW (*)    All other components within normal limits  POC URINE PREG, ED  TROPONIN I (HIGH SENSITIVITY)  TROPONIN I (HIGH SENSITIVITY)    EKG EKG Interpretation  Date/Time:  Wednesday September 28 2020 02:11:43 EDT Ventricular Rate:  103 PR Interval:  120 QRS Duration: 88 QT Interval:  340 QTC Calculation: 445 R Axis:   84 Text Interpretation: Sinus tachycardia Borderline Q waves in inferior leads Confirmed by Geoffery Lyons ([id number]) on 09/28/2020 2:18:18 AM  Radiology DG Chest 2 View  Result Date: 09/28/2020 CLINICAL DATA:  Chest pain, shortness of breath EXAM: CHEST - 2 VIEW COMPARISON:  03/11/2016 FINDINGS: Lungs are clear.  No pleural effusion or pneumothorax. The heart is normal in size. Visualized osseous structures are within normal limits. IMPRESSION: Normal chest radiographs. Electronically Signed   By: Charline Bills M.D.   On: 09/28/2020 03:22    Procedures Procedures   Medications Ordered in ED Medications - No data to display  ED Course  I have reviewed the triage vital signs and the nursing notes.  Pertinent labs & imaging results that were available during my care of the patient were reviewed by me and considered in my medical  decision making (see chart for details). Wells 0  PERC 0  HEART Score 2- low  MDM Rules/Calculators/A&P Presents emergency  department with chest pain that started 15 minutes prior to arrival. Her exam shows no sign of fluid volume overload lowering any concerned about heart failure.  EKG is without ischemia or infarction.  Troponin 4.  Ruling out STEMI/NSTEMI.  Her presentation is not consistent with an acute PE and Wells score of 0, PERC negative.  There is no pneumothorax visualized on chest x-ray. Her HPI and physical exam are not consistent with pericarditis, tamponade, myocarditis, pneumonia.   Exam is reassuring and at this time she is stable for discharge.  She has been hemodynamically stable throughout emergency department visit.  She has a primary care appointment on 10/06/2020.  I have instructed her to ensure that her primary care provider is aware of her emergency department visit today.   Final Clinical Impression(s) / ED Diagnoses Final diagnoses:  Precordial pain    Rx / DC Orders ED Discharge Orders     None        Cristopher Peruutry, Khrystina Bonnes E, PA-C 09/28/20 [phone]    Geoffery Lyonselo, [place], MD 09/28/20 [phone]

## 2020-09-28 NOTE — Discharge Instructions (Addendum)
You are seen in the emergency department today for chest pain and shortness of breath.  Work-up included EKG, chest x-ray, lab work all of which was negative.  No evidence that you are having heart attack, infection or other emergency.  I have attached instructions to the back of your discharge paperwork regarding chest pain.  Please follow-up with your primary care provider at your 10/06/2020 appointment.  Please return to the emergency department for any reason.

## 2020-09-28 NOTE — ED Triage Notes (Signed)
Pt complains of chest pain, arm pain, and shortness of breath x 20 mins ago. Pt states that she feels pressure in her chest that goes down her left arm.

## 2020-09-28 NOTE — ED Provider Notes (Signed)
[facility]    CSN: [id number] Arrival date & time: 09/28/20  0954      History   Chief Complaint Chief Complaint  Patient presents with   Chest Pain    HPI Katelyn Aguilar is a 20 y.o. female.   Patient presents today with chest pain that radiates down left arm that started approximately at 1 AM this morning.  Patient was seen in ER about 15 minutes after chest pain started.  Received a full work-up that included CBC, CMP, chest x-ray, EKG, pregnancy test, urinalysis.  All results were unremarkable.  Patient presents the urgent care due to continued chest pain.  States that chest pain also radiates up into left jaw area.  Denies any trauma to chest or history of chest pain.  Patient does have anxiety but states that this "feels different".  Chest pain is described as somebody "squeezing her chest" and is intermittent.  Also has some mild shortness of breath when chest pain first started but denies current shortness of breath.  Denies any fevers, upper respiratory symptoms, abdominal pain, diarrhea but did have some nausea and vomiting when symptoms were started as well.   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   Tachycardia 02/28/2020   Fatigue 02/28/2020   Screen for STD (sexually transmitted disease) 01/12/2020   Irregular menses 12/04/2019   Dizziness 04/22/2019   Cramp in limb 04/10/2019    Simple tics 11/17/2018   Involuntary movements 10/21/2018   Encounter for birth control 10/21/2018    Past Surgical History:  Procedure Laterality Date   BIOPSY  04/19/2020   Procedure: BIOPSY;  Surgeon: Tressia Danas, MD;  Location: [facility];  Service: Gastroenterology;;   ESOPHAGOGASTRODUODENOSCOPY     ESOPHAGOGASTRODUODENOSCOPY (EGD) WITH PROPOFOL N/A 04/19/2020   Procedure: ESOPHAGOGASTRODUODENOSCOPY (EGD) WITH PROPOFOL;  Surgeon: Tressia Danas, MD;  Location: [facility];  Service: Gastroenterology;  Laterality: N/A;   INCISION AND DRAINAGE ABSCESS N/A 04/26/2020   Procedure: excision of pilonidal cyst with open packing;  Surgeon: Darnell Level, MD;  Location: [facility];  Service: General;  Laterality: N/A;   NO PAST SURGERIES      OB History   No obstetric history on file.      Home Medications    Prior to Admission medications   Medication Sig Start Date End Date Taking? Authorizing Provider  acetaminophen (TYLENOL) 325 MG tablet Take 650 mg by mouth every 6 (six) hours as needed for moderate pain.    [provider]  ARIPiprazole (ABILIFY) 5 MG tablet Take 1 tablet (5 mg total) by mouth daily. 05/03/20   Levert Feinstein, MD  calcium carbonate (TUMS - DOSED IN MG ELEMENTAL CALCIUM) 500 MG chewable tablet Chew 1 tablet by mouth daily as needed for indigestion or heartburn.    [provider]  cetirizine (ZYRTEC) 10 MG tablet Take 1 tablet (10  mg total) by mouth daily. 11/10/19   Dana Allan, MD  escitalopram (LEXAPRO) 10 MG tablet TAKE 1 TABLET(10 MG) BY MOUTH DAILY 08/24/20   Dana Allan, MD  hydrOXYzine (ATARAX/VISTARIL) 10 MG tablet Take 1 tablet (10 mg total) by mouth 2 (two) times daily as needed. May increase to three times a day after 1 week if needed 05/17/20   Dana Allan, MD  meclizine (ANTIVERT) 25 MG tablet Take 1 tablet (25 mg total) by mouth 3 (three) times daily as needed for dizziness. May cause drowsiness 09/22/20   Particia Nearing, PA-C   metoCLOPramide (REGLAN) 10 MG tablet Take 1 tablet (10 mg total) by mouth 4 (four) times daily. 08/04/20   Unk Lightning, PA  pantoprazole (PROTONIX) 40 MG tablet Take 1 tablet (40 mg total) by mouth 2 (two) times daily. 03/09/20   Tressia Danas, MD  prochlorperazine (COMPAZINE) 10 MG tablet Take 1 tablet (10 mg total) by mouth 2 (two) times daily as needed for nausea or vomiting. 09/26/20   Cheryll Cockayne, MD  scopolamine (TRANSDERM-SCOP) 1 MG/3DAYS Place 1 patch (1.5 mg total) onto the skin every 3 (three) days. 09/23/20   [name], PA-C    Family History Family History  Problem Relation Age of Onset   COPD Mother    Depression Mother    Cancer Mother        blood cancer   Diabetes Other    Colon cancer Neg Hx    Esophageal cancer Neg Hx    Pancreatic cancer Neg Hx    Stomach cancer Neg Hx     Social History Social History   Tobacco Use   Smoking status: Never    Passive exposure: Yes   Smokeless tobacco: Never  Vaping Use   Vaping Use: Former  Substance Use Topics   Alcohol use: Yes    Comment: occasional   Drug use: No     Allergies   Patient has no known allergies.   Review of Systems Review of Systems Per HPI  Physical Exam Triage Vital Signs ED Triage Vitals  Enc Vitals Group     BP 09/28/20 1007 (!) 149/94     Pulse Rate 09/28/20 1007 (!) 104     Resp 09/28/20 1007 18     Temp 09/28/20 1007 97.6 F (36.4 C)     Temp Source 09/28/20 1007 Oral     SpO2 09/28/20 1007 97 %     Weight --      Height --      Head Circumference --      Peak Flow --      Pain Score 09/28/20 1010 4     Pain Loc --      Pain Edu? --      Excl. in GC? --    No data found.  Updated Vital Signs BP (!) 149/94 (BP Location: Right Arm)   Pulse (!) 104   Temp 97.6 F (36.4 C) (Oral)   Resp 18   LMP  (LMP Unknown) Comment: States on Depo up til last month  SpO2 97%   Visual Acuity Right Eye Distance:   Left Eye Distance:   Bilateral Distance:     Right Eye Near:   Left Eye Near:    Bilateral Near:     Physical Exam Exam conducted with a chaperone present.  Constitutional:      Appearance: Normal appearance.  HENT:     Head: Normocephalic and atraumatic.  Eyes:     Extraocular Movements: Extraocular movements intact.     Conjunctiva/sclera: Conjunctivae normal.  Cardiovascular:     Rate and Rhythm: Normal rate and regular rhythm.     Pulses: Normal pulses.     Heart sounds: Normal heart sounds.  Pulmonary:     Effort: Pulmonary effort is normal. No respiratory distress.     Breath sounds: Normal breath sounds. No wheezing or rhonchi.  Chest:     Chest wall: Tenderness present.     Comments: Tenderness to palpation to chest wall directly below left clavicle. Abdominal:     General: Bowel sounds are normal. There is no distension.     Palpations: Abdomen is soft.     Tenderness: There is no abdominal tenderness.  Musculoskeletal:     Right shoulder: Normal.     Left shoulder: Tenderness present. No swelling or crepitus. Normal strength. Normal pulse.     Comments: Tenderness to palpation generalized to left shoulder.  Neurovascular intact.  Skin:    General: Skin is warm and dry.  Neurological:     General: No focal deficit present.     Mental Status: She is alert and oriented to person, place, and time. Mental status is at baseline.  Psychiatric:        Mood and Affect: Mood normal.        Behavior: Behavior normal.        Thought Content: Thought content normal.        Judgment: Judgment normal.     UC Treatments / Results  Labs (all labs ordered are listed, but only abnormal results are displayed) Labs Reviewed - No data to display  EKG   Radiology DG Chest 2 View  Result Date: 09/28/2020 CLINICAL DATA:  Chest pain, shortness of breath EXAM: CHEST - 2 VIEW COMPARISON:  03/11/2016 FINDINGS: Lungs are clear.  No pleural effusion or pneumothorax. The heart is normal in size. Visualized osseous  structures are within normal limits. IMPRESSION: Normal chest radiographs. Electronically Signed   By: Charline BillsSriyesh  Krishnan M.D.   On: 09/28/2020 03:22    Procedures Procedures (including critical care time)  Medications Ordered in UC Medications  ketorolac (TORADOL) injection 30 mg (30 mg Intramuscular Given 09/28/20 1037)    Initial Impression / Assessment and Plan / UC Course  I have reviewed the triage vital signs and the nursing notes.  Pertinent labs & imaging results that were available during my care of the patient were reviewed by me and considered in my medical decision making (see chart for details).     Reviewed ER notes from patient's visit earlier this morning.  All labs and tests were unremarkable.  EKG normal sinus rhythm in urgent care.  No red flags at this time.  Suspect pain is musculoskeletal in nature due to tenderness to palpation on exam.  Ketorolac injection administered today in urgent care.  Advised patient to avoid any over-the-counter NSAIDs for at least 24 hours following injection.  Patient to follow-up with PCP for further evaluation and management.  Advised patient to go back to the ER if pain significantly worsens.Discussed strict return precautions. Patient verbalized understanding and is agreeable with plan.  Discussed clinical course with supervising physician. Final Clinical Impressions(s) / UC Diagnoses   Final diagnoses:  Other chest pain     Discharge Instructions      Please follow up with primary care for further evaluation and management.  You have been given ketorolac injection today in urgent  care to help with pain and inflammation.  Please do not take any over-the-counter ibuprofen, Advil, Aleve for at least 24 hours following injection.  Go to the hospital if symptoms worsen.     ED Prescriptions   None    PDMP not reviewed this encounter.   Lance Muss, FNP 09/28/20 1055

## 2020-09-28 NOTE — ED Triage Notes (Signed)
C/o chest pain that came on suddenly at 1 am this morning that was shooting down left arm. While in the ER began having neck and jaw pain. Reports mild shortness of breath. Denies cough, fever

## 2020-09-28 NOTE — Discharge Instructions (Addendum)
Please follow up with primary care for further evaluation and management.  You have been given ketorolac injection today in urgent care to help with pain and inflammation.  Please do not take any over-the-counter ibuprofen, Advil, Aleve for at least 24 hours following injection.  Go to the hospital if symptoms worsen.

## 2020-09-29 ENCOUNTER — Telehealth: Payer: Self-pay | Admitting: *Deleted

## 2020-09-29 DIAGNOSIS — Z419 Encounter for procedure for purposes other than remedying health state, unspecified: Secondary | ICD-10-CM | POA: Diagnosis not present

## 2020-09-29 NOTE — Telephone Encounter (Signed)
error 

## 2020-09-29 NOTE — Telephone Encounter (Signed)
Transition Care Management Unsuccessful Follow-up Telephone Call  Date of discharge and from where:  09/28/2020 - [facility]  Attempts:  1st Attempt  Reason for unsuccessful TCM follow-up call:  Voice mail full

## 2020-09-30 NOTE — Telephone Encounter (Signed)
Transition Care Management Unsuccessful Follow-up Telephone Call  Date of discharge and from where:  09/28/2020 - [facility]  Attempts:  2nd Attempt  Reason for unsuccessful TCM follow-up call:  Voice mail full

## 2020-10-04 ENCOUNTER — Other Ambulatory Visit: Payer: Self-pay

## 2020-10-04 ENCOUNTER — Ambulatory Visit (INDEPENDENT_AMBULATORY_CARE_PROVIDER_SITE_OTHER): Payer: Medicaid Other | Admitting: Licensed Clinical Social Worker

## 2020-10-04 DIAGNOSIS — F411 Generalized anxiety disorder: Secondary | ICD-10-CM | POA: Diagnosis not present

## 2020-10-04 DIAGNOSIS — F332 Major depressive disorder, recurrent severe without psychotic features: Secondary | ICD-10-CM | POA: Insufficient documentation

## 2020-10-04 NOTE — Plan of Care (Signed)
Pt agreeable to plan  ?
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
## 2020-10-04 NOTE — Telephone Encounter (Signed)
Transition Care Management Unsuccessful Follow-up Telephone Call  Date of discharge and from where:  09/28/2020-[facility]  Attempts:  3rd Attempt  Reason for unsuccessful TCM follow-up call:  Voice mail full

## 2020-10-06 ENCOUNTER — Ambulatory Visit (INDEPENDENT_AMBULATORY_CARE_PROVIDER_SITE_OTHER): Payer: Medicaid Other | Admitting: Family Medicine

## 2020-10-06 ENCOUNTER — Other Ambulatory Visit: Payer: Self-pay

## 2020-10-06 ENCOUNTER — Ambulatory Visit (HOSPITAL_COMMUNITY)
Admission: RE | Admit: 2020-10-06 | Discharge: 2020-10-06 | Disposition: A | Discharge status: Home / Self Care | Payer: Medicaid Other | Source: Ambulatory Visit | Attending: Family Medicine | Admitting: Family Medicine

## 2020-10-06 VITALS — BP 122/68 | HR 108 | Ht 63.0 in | Wt 334.2 lb

## 2020-10-06 DIAGNOSIS — F339 Major depressive disorder, recurrent, unspecified: Secondary | ICD-10-CM

## 2020-10-06 DIAGNOSIS — F959 Tic disorder, unspecified: Secondary | ICD-10-CM

## 2020-10-06 DIAGNOSIS — R42 Dizziness and giddiness: Secondary | ICD-10-CM

## 2020-10-06 DIAGNOSIS — R259 Unspecified abnormal involuntary movements: Secondary | ICD-10-CM | POA: Diagnosis not present

## 2020-10-06 DIAGNOSIS — F418 Other specified anxiety disorders: Secondary | ICD-10-CM | POA: Diagnosis not present

## 2020-10-06 DIAGNOSIS — F411 Generalized anxiety disorder: Secondary | ICD-10-CM | POA: Diagnosis not present

## 2020-10-06 MED ORDER — SCOPOLAMINE 1 MG/3DAYS TD PT72: 1.0000 | MEDICATED_PATCH | TRANSDERMAL | 0 refills | Status: DC

## 2020-10-06 MED ORDER — ARIPIPRAZOLE 5 MG PO TABS: 5.0000 mg | ORAL_TABLET | Freq: Every day | ORAL | 0 refills | Status: DC

## 2020-10-06 NOTE — Assessment & Plan Note (Signed)
Refilled Abilify per patient request, says she is close to being out.  Tolerating medicine well.  No complaints.  Follow-up with PCP scheduled.

## 2020-10-06 NOTE — Assessment & Plan Note (Addendum)
Dizziness has been occurring for years, worsening in the last month.  Nausea is helped by scopolamine patch. Only pattern patient aware of at that this is worse with heightened anxiety or fatigue.  No association with position changes.  Hallpike maneuver negative in office today.  Heart regular, without murmur, EKG demonstrates sinus tachycardia at her baseline.  Given improvement with scopolamine, normal heart sounds, unremarkable EKG, I do not believe there is a cardiac component to this and decline to refer her to cardiology at this time.  - Referral for neurovestibular rehab eval and treat - Refill scopolamine patches - Follow-up with PCP scheduled

## 2020-10-06 NOTE — Progress Notes (Signed)
SUBJECTIVE:   CHIEF COMPLAINT / HPI:   Chest pain, dizziness - feels better after ED and UC for the most part - received transdermal scopolamine patch at UC, this has been working and requests refill - got zofran IM, worked for a little bit but wore off and vomiting by the next morning - vomiting for 3 days total, has been a week or so since last emesis - reports that nausea medicine only works for her if in the form of IV or IM, PO doesn't work for her and she vomits through it - chest pain went away with toradol shot in UC, hasn't come back  - would also like abilify refilled, PCP has been the one refilling it after discontinuing with neurologist Dr. 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 female.  With past medical history of anxiety, GERD, tachycardia who presents emergency department with complaint of chest pain.  She states that chest pain started 15 minutes prior to arrival to the emergency department.  She describes it as intermittent retrosternal pressure that radiates to the left arm.  She states she has had chest pain before but never like this.  Also endorses that she has been short of breath and somewhat lightheaded since onset of chest pain.  States that she feels somewhat anxious however this is different than her anxiety.  During the interview patient states that she is also beginning to have a sensation of "pressure" under her chin and throat, and is concerned that her heart rate is only 93.  Patient reassured. Denies syncope, nausea or vomiting, diaphoresis." Wells 0  PERC 0  HEART Score 2- low   Troponin 4  CHEST - 2 VIEW CLINICAL DATA:  Chest pain, shortness of breath COMPARISON:  03/11/2016 FINDINGS: Lungs are clear.  No pleural effusion or pneumothorax. The heart is normal in size. Visualized osseous structures are within normal limits. IMPRESSION: Normal chest radiographs.  8/31 [facility] "Reviewed ER notes from patient's visit earlier this morning.  All labs and tests were unremarkable.  EKG normal sinus rhythm in urgent care.  No red flags at this time.  Suspect pain is musculoskeletal in nature due to tenderness to palpation on exam.  Ketorolac injection administered today in urgent care.  Advised patient to avoid any over-the-counter NSAIDs for at least 24 hours following injection.  Patient to follow-up with PCP for further evaluation and management."  PERTINENT  PMH / PSH: MDD, GAD, simple tics, tachycardia, obesity, irregular menses, pilonidal cyst  OBJECTIVE:   BP 122/68   Pulse (!) 108   Ht 5\' 3"  (1.6 m)   Wt (!) 334 lb 3.2 oz (151.6 kg)   LMP  (LMP Unknown) Comment: States on Depo up til last month  SpO2 97%   BMI 59.20 kg/m    PHQ-9:  Depression screen [facility] 2/9 10/06/2020 09/01/2020 08/30/2020  Decreased Interest 2 1 1   Down, Depressed, Hopeless 1 0 0  PHQ - 2 Score 3 1 1   Altered sleeping 1 1 1   Tired, decreased energy 1 0 1  Change in appetite  1 1 1   Feeling bad or failure about yourself  0 0 0  Trouble concentrating 1 1 0  Moving slowly or fidgety/restless 0 0 0  Suicidal thoughts 0 0 0  PHQ-9 Score 7 4 4   Difficult doing work/chores Not difficult at all Not difficult at all -  Some encounter information is confidential and restricted. Go to Review Flowsheets activity to see all data.  Some recent data might be hidden     GAD-7:  GAD 7 : Generalized Anxiety Score 08/17/2020 07/28/2020 06/06/2020 05/09/2020  Nervous, Anxious, on Edge 3 1 3 3   Control/stop worrying 2 1 2 3   Worry too much - different things 2 1 2 3   Trouble relaxing 1 1 2 3    Restless 1 1 2 3   Easily annoyed or irritable 1 1 2 3   Afraid - awful might happen 1 1 1 3   Total GAD 7 Score 11 7 14 21   Anxiety Difficulty Not difficult at all - - -  Some encounter information is confidential and restricted. Go to Review Flowsheets activity to see all data.     Physical Exam General: Awake, alert, oriented, visibly anxious with quick speech and bouncing leg Cardiovascular: Regular rate and rhythm, S1 and S2 present, no murmurs auscultated Respiratory: Lung fields clear to auscultation bilaterally Special tests: Hallpike maneuver negative  EKG in office demonstrates sinus tachycardia at rate of 108 bpm with narrow QRS, QTC 450, no ST elevation or depression  ASSESSMENT/PLAN:   Dizziness Dizziness has been occurring for years, worsening in the last month.  Nausea is helped by scopolamine patch. Only pattern patient aware of at that this is worse with heightened anxiety or fatigue.  No association with position changes.  Hallpike maneuver negative in office today.  Heart regular, without murmur, EKG demonstrates sinus tachycardia at her baseline.  Given improvement with scopolamine, normal heart sounds, unremarkable EKG, I do not believe there is a cardiac component to this and decline to refer her to cardiology at this time.  - Referral for neurovestibular rehab eval and treat - Refill scopolamine patches - Follow-up with PCP scheduled  Anxiety with depression Refilled Abilify per patient request, says she is close to being out.  Tolerating medicine well.  No complaints.  Follow-up with PCP scheduled.     08/06/2020, MD [facility]

## 2020-10-06 NOTE — Patient Instructions (Signed)
It was wonderful to meet you today. Thank you for allowing me to be a part of your care. Below is a short summary of what we discussed at your visit today:  Dizziness - I sent a refill of her scopolamine patches to your pharmacy - I have placed a referral to neurovestibular rehab for evaluation and treatment of your dizziness - Follow-up with your PCP Dr. Clent Ridges at the end of the month, appointment details below   Please bring all of your medications to every appointment!  If you have any questions or concerns, please do not hesitate to contact us via phone or MyChart message.   Fayette Pho, MD

## 2020-10-11 ENCOUNTER — Ambulatory Visit (HOSPITAL_COMMUNITY)
Admission: EM | Admit: 2020-10-11 | Discharge: 2020-10-11 | Disposition: A | Discharge status: Home / Self Care | Payer: Medicaid Other | Attending: Emergency Medicine | Admitting: Emergency Medicine

## 2020-10-11 ENCOUNTER — Encounter (HOSPITAL_COMMUNITY): Payer: Self-pay | Admitting: Emergency Medicine

## 2020-10-11 ENCOUNTER — Other Ambulatory Visit: Payer: Self-pay

## 2020-10-11 DIAGNOSIS — F17200 Nicotine dependence, unspecified, uncomplicated: Secondary | ICD-10-CM

## 2020-10-11 DIAGNOSIS — H66002 Acute suppurative otitis media without spontaneous rupture of ear drum, left ear: Secondary | ICD-10-CM | POA: Diagnosis not present

## 2020-10-11 MED ORDER — AMOXICILLIN 500 MG PO CAPS: 500.0000 mg | ORAL_CAPSULE | Freq: Two times a day (BID) | ORAL | 0 refills | Status: AC

## 2020-10-11 NOTE — Discharge Instructions (Addendum)
You have an ear infection in your left ear.  I provided you with a prescription for amoxicillin that you should take twice daily for 10 days.  Please take all as prescribed and do not discontinue even if you are feeling better.  If you have increased fever, increased sore throat, acute shortness of breath please return for further evaluation.

## 2020-10-11 NOTE — ED Triage Notes (Signed)
Pt presents with cough, sore throat, and runny nose that started this am.

## 2020-10-11 NOTE — ED Provider Notes (Signed)
[facility]    CSN: [id number] Arrival date & time: 10/11/20  1513      History   Chief Complaint Chief Complaint  Patient presents with   Cough   Sore Throat   Nasal Congestion    HPI Katelyn Aguilar is a 20 y.o. female.   Complains of waking up this morning with cough, congestion and sore throat.  Patient is a current every day smoker.  Patient reports a history of frequent ear infections, denies ear pain at this time.  Cough is not productive, is intermittent, is new, patient reports sick contact with brother.  Patient has not tried any medications or home remedies for her symptoms.   Cough Sore Throat   Past Medical History:  Diagnosis Date   Allergic rhinitis    Anxiety    Anxiety 11/17/2018   Dysrhythmia    tachycardia   GERD (gastroesophageal reflux disease)    Nausea and vomiting 04/06/2019   Obesity    Pilonidal abscess 12/04/2019   Pilonidal cyst without abscess 04/18/2020   Pyelonephritis 09/15/2019    Patient Active Problem List   Diagnosis Date Noted   Severe episode of recurrent major depressive disorder, 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   GAD (generalized anxiety disorder) 10/04/2020   Ingrown toenail of both feet 08/19/2020   Pain of left breast 06/26/2020   BMI 60.0-69.9, adult (HCC) 05/19/2020   Contraception management 05/19/2020   Anxiety with depression 05/09/2020   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/26/2020   Tachycardia 02/28/2020   Fatigue 02/28/2020   Screen for STD (sexually transmitted disease) 01/12/2020   Irregular menses 12/04/2019   Dizziness 04/22/2019   Cramp in limb 04/10/2019   Simple tics 11/17/2018   Involuntary movements 10/21/2018   Encounter for birth control 10/21/2018    Past Surgical History:  Procedure Laterality Date   BIOPSY  04/19/2020   Procedure: BIOPSY;  Surgeon: Tressia Danas, MD;  Location: [facility];  Service: Gastroenterology;;   ESOPHAGOGASTRODUODENOSCOPY      ESOPHAGOGASTRODUODENOSCOPY (EGD) WITH PROPOFOL N/A 04/19/2020   Procedure: ESOPHAGOGASTRODUODENOSCOPY (EGD) WITH PROPOFOL;  Surgeon: Tressia Danas, MD;  Location: [facility];  Service: Gastroenterology;  Laterality: N/A;   INCISION AND DRAINAGE ABSCESS N/A 04/26/2020   Procedure: excision of pilonidal cyst with open packing;  Surgeon: Darnell Level, MD;  Location: [facility];  Service: General;  Laterality: N/A;   NO PAST SURGERIES      OB History   No obstetric history on file.      Home Medications    Prior to Admission medications   Medication Sig Start Date End Date Taking? Authorizing Provider  amoxicillin (AMOXIL) 500 MG capsule Take 1 capsule (500 mg total) by mouth 2 (two) times daily for 10 days. 10/11/20 10/21/20 Yes Theadora Rama Scales, PA-C  acetaminophen (TYLENOL) 325 MG tablet Take 650 mg by mouth every 6 (six) hours as needed for moderate pain.    [provider]  ARIPiprazole (ABILIFY) 5 MG tablet Take 1 tablet (5 mg total) by mouth daily. 10/06/20   Fayette Pho, MD  calcium carbonate (TUMS - DOSED IN MG ELEMENTAL CALCIUM) 500 MG chewable tablet Chew 1 tablet by mouth daily as needed for indigestion or heartburn.    [provider]  cetirizine (ZYRTEC) 10 MG tablet Take 1 tablet (10 mg total) by mouth daily. 11/10/19   Dana Allan, MD  escitalopram (LEXAPRO) 10 MG tablet TAKE 1 TABLET(10 MG) BY MOUTH DAILY 08/24/20   Dana Allan, MD  hydrOXYzine (ATARAX/VISTARIL) 10 MG tablet Take 1 tablet (10 mg total) by mouth 2 (two) times daily as needed. May increase to three times a day after 1 week if needed 05/17/20   Dana Allan, MD  metoCLOPramide (REGLAN) 10 MG tablet Take 1 tablet (10 mg total) by mouth 4 (four) times daily. 08/04/20   Unk Lightning, PA  pantoprazole (PROTONIX) 40 MG tablet Take 1 tablet (40 mg total) by mouth 2 (two) times daily. 03/09/20   Tressia Danas, MD  scopolamine (TRANSDERM-SCOP) 1 MG/3DAYS Place 1 patch (1.5 mg total) onto  the skin every 3 (three) days. 10/06/20   Fayette Pho, MD    Family History Family History  Problem Relation Age of Onset   COPD Mother    Depression Mother    Cancer Mother        blood cancer   Diabetes Other    Colon cancer Neg Hx    Esophageal cancer Neg Hx    Pancreatic cancer Neg Hx    Stomach cancer Neg Hx     Social History Social History   Tobacco Use   Smoking status: Never    Passive exposure: Yes   Smokeless tobacco: Never  Vaping Use   Vaping Use: Former  Substance Use Topics   Alcohol use: Yes    Comment: occasional   Drug use: No     Allergies   Patient has no known allergies.   Review of Systems Review of Systems Per HPI  Physical Exam Triage Vital Signs ED Triage Vitals  Enc Vitals Group     BP 10/11/20 1546 128/86     Pulse Rate 10/11/20 1546 66     Resp 10/11/20 1546 18     Temp 10/11/20 1546 99.2 F (37.3 C)     Temp Source 10/11/20 1546 Oral     SpO2 10/11/20 1546 98 %     Weight --      Height --      Head Circumference --      Peak Flow --      Pain Score 10/11/20 1544 0     Pain Loc --      Pain Edu? --      Excl. in GC? --    No data found.  Updated Vital Signs BP 128/86 (BP Location: Right Wrist)   Pulse 66   Temp 99.2 F (37.3 C) (Oral)   Resp 18   LMP  (LMP Unknown) Comment: States has not had period since stopping depo 1-2 months ago.  SpO2 98%   Visual Acuity Right Eye Distance:   Left Eye Distance:   Bilateral Distance:    Right Eye Near:   Left Eye Near:    Bilateral Near:     Physical Exam Constitutional:      Appearance: She is well-developed.  HENT:     Head: Normocephalic and atraumatic.     Right Ear: Tympanic membrane and ear canal normal. Right ear middle ear effusion: Suppurative.     Left Ear: Ear canal normal. A middle ear effusion (Suppurative) is present.     Mouth/Throat:     Mouth: Mucous membranes are moist. No oral lesions.     Pharynx: Oropharynx is clear. Uvula midline. No  pharyngeal swelling, oropharyngeal exudate, posterior oropharyngeal erythema or uvula swelling.     Tonsils: No tonsillar exudate or tonsillar abscesses. 0 on the right. 0 on the left.  Eyes:     Conjunctiva/sclera: Conjunctivae normal.  Pupils: Pupils are equal, round, and reactive to light.  Cardiovascular:     Rate and Rhythm: Normal rate and regular rhythm.     Heart sounds: Normal heart sounds.  Pulmonary:     Effort: Pulmonary effort is normal.     Breath sounds: Normal breath sounds.  Musculoskeletal:     Cervical back: Normal range of motion and neck supple.  Lymphadenopathy:     Cervical: Cervical adenopathy present.  Neurological:     General: No focal deficit present.     Mental Status: She is alert and oriented to person, place, and time.  Psychiatric:        Mood and Affect: Mood normal.        Behavior: Behavior normal.     UC Treatments / Results  Labs (all labs ordered are listed, but only abnormal results are displayed) Labs Reviewed - No data to display  EKG   Radiology No results found.  Procedures Procedures (including critical care time)  Medications Ordered in UC Medications - No data to display  Initial Impression / Assessment and Plan / UC Course  I have reviewed the triage vital signs and the nursing notes.  Pertinent labs & imaging results that were available during my care of the patient were reviewed by me and considered in my medical decision making (see chart for details).     Patient has otitis media in her left ear, patient denies ear pain and loss of hearing at this time.  Will treat patient with amoxicillin for 10 days to resolve this issue.  Patient has been advised to quit smoking as this does increase her risk for recurrent ear infections. Final Clinical Impressions(s) / UC Diagnoses   Final diagnoses:  Acute suppurative otitis media of left ear without spontaneous rupture of tympanic membrane, recurrence not specified   Tobacco dependence     Discharge Instructions      You have an ear infection in your left ear.  I provided you with a prescription for amoxicillin that you should take twice daily for 10 days.  Please take all as prescribed and do not discontinue even if you are feeling better.  If you have increased fever, increased sore throat, acute shortness of breath please return for further evaluation.     ED Prescriptions     Medication Sig Dispense Auth. Provider   amoxicillin (AMOXIL) 500 MG capsule Take 1 capsule (500 mg total) by mouth 2 (two) times daily for 10 days. 20 capsule Theadora Rama Scales, PA-C      PDMP not reviewed this encounter.   Theadora Rama Scales, PA-C 10/11/20 1635

## 2020-10-20 ENCOUNTER — Ambulatory Visit (INDEPENDENT_AMBULATORY_CARE_PROVIDER_SITE_OTHER): Payer: Medicaid Other | Admitting: Licensed Clinical Social Worker

## 2020-10-20 DIAGNOSIS — F411 Generalized anxiety disorder: Secondary | ICD-10-CM

## 2020-10-20 DIAGNOSIS — F332 Major depressive disorder, recurrent severe without psychotic features: Secondary | ICD-10-CM | POA: Diagnosis not present

## 2020-10-20 NOTE — Progress Notes (Signed)
   THERAPIST PROGRESS NOTE  Session Time: 84  Virtual Visit via Telephone Note  I connected with Katelyn Aguilar on 10/20/20 at  2:00 PM EDT by telephone and verified that I am speaking with the correct person using two identifiers.  Location: Patient: [facility]  Provider: Providers Home    I discussed the limitations, risks, security and privacy concerns of performing an evaluation and management service by telephone and the availability of in person appointments. I also discussed with the patient that there may be a patient responsible charge related to this service. The patient expressed understanding and agreed to proceed.  Pt was alert and oriented x 5. She was dressed casually and engaged well in therapy session. Pt presented with depressed and anxious mood/affect. Katelyn Aguilar was pleasant, cooperative, and maintained good eye contact.   Primary stressor for pt include work, family conflict, and financial. Pt reports that she quit her job 2 months ago. 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 Pt reports that she lives with her stepdad, mom, brother, and fianc. Income that contributes to household bills are fianc and stepdad. Rent and bills are going up monthly to 300.00 every two weeks for pt and her fianc or 600 per month. Pt reports that this is manageable even without her having an income. Katelyn Aguilar states depressive and anxiety symptoms for tension, worry, worthlessness, hopelessness, irritability and mood swings. Pt states that people can "set me off with the littlest of things". Pt used the example of her dad making a smirk when she said she wanted to workout and lose weight.    Intervention/Plan: LCSW utilized supportive therapy, person centered therapy, and CBT in today's session. LCSW used genuineness, positive regard for person centered therapy. CBT was utilized for cognitive restructuring and motivational interviewing. Positive affirmation  was discussed in depth for 10 minutes in session. Plan for pt is to utilize planner 4/7 days per week. Pt to work out 7/7 days per week for 5 to 10 minutes. Katelyn Aguilar to utilize positive affirmation 3 per week.     I discussed the assessment and treatment plan with the patient. The patient was provided an opportunity to ask questions and all were answered. The patient agreed with the plan and demonstrated an understanding of the instructions.   The patient was advised to call back or seek an in-person evaluation if the symptoms worsen or if the condition fails to improve as anticipated.  I provided 40 minutes of non-face-to-face time during this encounter.   Weber Cooks, LCSW   Participation Level: Active  Behavioral Response: CasualAlertAnxious and Depressed  Type of Therapy: Individual Therapy  Treatment Goals addressed: Communication: depression  and Diagnosis: anxiety   Interventions: CBT, Motivational Interviewing, Supportive, and Reframing   Suicidal/Homicidal: NAwithout intent/plan    Plan: Return again in 4 weeks.     Weber Cooks, LCSW 10/20/2020

## 2020-10-21 ENCOUNTER — Ambulatory Visit: Payer: Medicaid Other | Admitting: Gastroenterology

## 2020-10-24 ENCOUNTER — Ambulatory Visit: Payer: Medicaid Other | Attending: Family Medicine

## 2020-10-24 ENCOUNTER — Other Ambulatory Visit: Payer: Self-pay

## 2020-10-24 DIAGNOSIS — R2681 Unsteadiness on feet: Secondary | ICD-10-CM

## 2020-10-24 DIAGNOSIS — R42 Dizziness and giddiness: Secondary | ICD-10-CM

## 2020-10-24 NOTE — Therapy (Signed)
[facility] [address] [place], [place], [address] Phone: [phone]   Fax:  [phone]  Physical Therapy Evaluation  Patient Details  Name: Katelyn Aguilar MRN: [id number] Date of Birth: 2000-07-17 Referring Provider (PT): Pearlean Brownie, MD   Encounter Date: 10/24/2020   PT End of Session - 10/24/20 1056     Visit Number 1    Number of Visits 5    Date for PT Re-Evaluation --   at 5th visit   Authorization Type [employer] (VL: 27)    Authorization Time Period awaiting authorization    PT Start Time 1058    PT Stop Time 1141    PT Time Calculation (min) 43 min    Activity Tolerance Patient tolerated treatment well    Behavior During Therapy [facility] for tasks assessed/performed             Past Medical History:  Diagnosis Date   Allergic rhinitis    Anxiety    Anxiety 11/17/2018   Dysrhythmia    tachycardia   GERD (gastroesophageal reflux disease)    Nausea and vomiting 04/06/2019   Obesity    Pilonidal abscess 12/04/2019   Pilonidal cyst without abscess 04/18/2020   Pyelonephritis 09/15/2019    Past Surgical History:  Procedure Laterality Date   BIOPSY  04/19/2020   Procedure: BIOPSY;  Surgeon: Tressia Danas, MD;  Location: [facility];  Service: Gastroenterology;;   ESOPHAGOGASTRODUODENOSCOPY     ESOPHAGOGASTRODUODENOSCOPY (EGD) WITH PROPOFOL N/A 04/19/2020   Procedure: ESOPHAGOGASTRODUODENOSCOPY (EGD) WITH PROPOFOL;  Surgeon: Tressia Danas, MD;  Location: [facility];  Service: Gastroenterology;  Laterality: N/A;   INCISION AND DRAINAGE ABSCESS N/A 04/26/2020   Procedure: excision of pilonidal cyst with open packing;  Surgeon: Darnell Level, MD;  Location: [facility];  Service: General;  Laterality: N/A;   NO PAST SURGERIES      There were no vitals filed for this visit.    Subjective Assessment - 10/24/20 1057     Subjective Patient reports the dizziness comes/goes. Patient reports the  other day, she felt as if one side of her body was dizzy and the other side was not. Patient reports she used to get dizziness, eat something sweet and would improve. She related to blood sugar. Reports episodes last hours to days. Patient reports that she notices it happens at night. Reports she also has anxiety tics. Patient reports sometimes she feels like she is spinning, and then sometimes she feels like she is rocking back and forth like on a boat. No history of motion sickness. Patient currently not working, but was restricting her work prior. Denies falls, but has had a stumble because of the dizziness. Patient reports she does get migraines, sometimes feel as this can trigger the dizziness along with light sensitivity. Denies hearing changes, but intermittent blurred vision.    Pertinent History Anxiety, GERD, Obesity    Limitations Walking;House hold activities    Patient Stated Goals Improve the Dizziness    Currently in Pain? No/denies                [facility] PT Assessment - 10/24/20 0001       Assessment   Medical Diagnosis Dizziness    Referring Provider (PT) Pearlean Brownie, MD    Onset Date/Surgical Date 10/06/20   referral date   Hand Dominance Right      Precautions   Precautions Other (comment)    Precaution Comments Anxiety, GERD, Obesity, Tics  Restrictions   Weight Bearing Restrictions No      Balance Screen   Has the patient fallen in the past 6 months No    Has the patient had a decrease in activity level because of a fear of falling?  Yes   when having an episode of dizziness   Is the patient reluctant to leave their home because of a fear of falling?  Yes   when having an episode of dizziness     Home Nurse, mental health Private residence    Living Arrangements Parent;Spouse/significant other    Available Help at Discharge Family    Type of Home House    Home Access Stairs to enter    Entrance Stairs-Number of Steps 2    Entrance  Stairs-Rails None    Home Layout One level      Prior Function   Level of Independence Independent    Vocation Unemployed    Leisure does not drive; not currently working      Observation/Other Assessments   Focus on Therapeutic Outcomes (FOTO)  staff did not capture on evaluation      Sensation   Light Touch Appears Intact      ROM / Strength   AROM / PROM / Strength Strength      Strength   Overall Strength Within functional limits for tasks performed                    Vestibular Assessment - 10/24/20 0001       Symptom Behavior   Subjective history of current problem see subjective; reports history of dizzy spells since high school    Type of Dizziness  Blurred vision;Spinning;Unsteady with head/body turns;Imbalance;Lightheadedness    Frequency of Dizziness couple times a week    Duration of Dizziness hours to days    Symptom Nature Motion provoked;Intermittent    Aggravating Factors Activity in general;Turning body quickly;Turning head quickly;Forward bending    Relieving Factors Head stationary;Rest    Progression of Symptoms Worse      Oculomotor Exam   Oculomotor Alignment Normal    Ocular ROM WNL    Spontaneous Absent    Gaze-induced  Absent    Smooth Pursuits Intact    Saccades Intact      Oculomotor Exam-Fixation Suppressed    Left Head Impulse Negative    Right Head Impulse Negative      Vestibulo-Ocular Reflex   VOR 1 Head Only (x 1 viewing) Normal. No Dizziness.    VOR to Slow Head Movement Normal    VOR Cancellation Normal      Visual Acuity   Static 10    Dynamic 10   blurry vision reported, no dizziness     Positional Testing   Dix-Hallpike Dix-Hallpike Right;Dix-Hallpike Left    Horizontal Canal Testing Horizontal Canal Right;Horizontal Canal Left      Dix-Hallpike Right   Dix-Hallpike Right Duration 0    Dix-Hallpike Right Symptoms No nystagmus      Dix-Hallpike Left   Dix-Hallpike Left Duration 0    Dix-Hallpike Left  Symptoms No nystagmus      Horizontal Canal Right   Horizontal Canal Right Duration 0    Horizontal Canal Right Symptoms Normal      Horizontal Canal Left   Horizontal Canal Left Duration 0    Horizontal Canal Left Symptoms Normal      Positional Sensitivities   Sit to Supine No dizziness    Supine  to Left Side No dizziness    Supine to Right Side No dizziness    Supine to Sitting Lightheadedness    Right Hallpike No dizziness    Up from Right Hallpike No dizziness    Up from Left Hallpike No dizziness    Nose to Right Knee No dizziness    Right Knee to Sitting Mild dizziness    Nose to Left Knee No dizziness    Left Knee to Sitting No dizziness    Head Turning x 5 Mild dizziness    Head Nodding x 5 Mild dizziness    Pivot Right in Standing No dizziness    Pivot Left in Standing No dizziness    Rolling Right No dizziness    Rolling Left No dizziness    Positional Sensitivities Comments mild motion sensitivity; resolution quickly noted                Objective measurements completed on examination: See above findings.                PT Education - 10/24/20 1109     Education Details POC/Evaluation Findings    Person(s) Educated Patient    Methods Explanation    Comprehension Verbalized understanding              PT Short Term Goals - 10/24/20 1146       PT SHORT TERM GOAL #1   Title = LTGs               PT Long Term Goals - 10/24/20 1147       PT LONG TERM GOAL #1   Title Patient will be independent with final vestibular/balance HEP (ALL LTGs Due at 5th Visit)    Baseline no HEP established    Time 4    Period Weeks    Status New    Target Date --   at 5th visit     PT LONG TERM GOAL #2   Title LTG to be set for M-CTSIB as applicable    Baseline TBA    Time 4    Period Weeks    Status New      PT LONG TERM GOAL #3   Title Patient will report </= 1 on all components of MSQ to demo improved activity tolerance    Baseline  1-2/5    Time 4    Period Weeks    Status New                    Plan - 10/24/20 1149     Clinical Impression Statement Patient is a 20 y.o. female referred to Neuro OPPT services for Dizziness. Patient's PMH significant for the following: Anxiety, GERD, Obesity, Tics. Patient presents with the following on evaluation: (-) positional testing indicating no evidence of BPPV, normal oculomotor exam.  Increased motion senstivity noted on MSQ and mild unsteadiness of gait. Will undergo formal balance assesment at next visit. Mild dizziness noted with MSQ demonstrating increased motion but resolution quickly noted. Normal DVA but did experience mild blurry vision. Patient will benefit from skilled PT services to address impairments and improved activity tolerance.    Personal Factors and Comorbidities Comorbidity 3+;Time since onset of injury/illness/exacerbation    Comorbidities Anxiety, GERD, Obesity, Tics    Examination-Activity Limitations Bend;Stand;Locomotion Level    Examination-Participation Restrictions Interpersonal Relationship;Occupation;Community Activity    Stability/Clinical Decision Making Stable/Uncomplicated    Clinical Decision Making Low    Rehab Potential Good  PT Frequency 1x / week    PT Duration 4 weeks    PT Treatment/Interventions ADLs/Self Care Home Management;Canalith Repostioning;Cryotherapy;Electrical Stimulation;Moist Heat;Gait training;Stair training;Functional mobility training;Therapeutic activities;Therapeutic exercise;Balance training;Neuromuscular re-education;Patient/family education;Manual techniques;Vestibular;Passive range of motion    PT Next Visit Plan Assess M-CTSIB. Initiate HEP focused on balance and habituation to head turns, bending    Consulted and Agree with Plan of Care Patient             Patient will benefit from skilled therapeutic intervention in order to improve the following deficits and impairments:  Decreased balance,  Decreased activity tolerance, Dizziness  Visit Diagnosis: Dizziness and giddiness  Unsteadiness on feet     Problem List Patient Active Problem List   Diagnosis Date Noted   Severe episode of recurrent major depressive disorder, 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   GAD (generalized anxiety disorder) 10/04/2020   Ingrown toenail of both feet 08/19/2020   Pain of left breast 06/26/2020   BMI 60.0-69.9, adult (HCC) 05/19/2020   Contraception management 05/19/2020   Anxiety with depression 05/09/2020   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/26/2020   Tachycardia 02/28/2020   Fatigue 02/28/2020   Screen for STD (sexually transmitted disease) 01/12/2020   Irregular menses 12/04/2019   Dizziness 04/22/2019   Cramp in limb 04/10/2019   Simple tics 11/17/2018   Involuntary movements 10/21/2018   Encounter for birth control 10/21/2018   [facility] Authorization   Choose one: Neuro Rehabilitative  Standardized Assessment or Functional Outcome Tool: See Pain Assessment and Other MSQ: 1-2/5   Body Parts Treated (Select each separately):  Other Inner Ear . Overall deficits/functional limitations for body part selected: mild Other BLE/Balance . Overall deficits/functional limitations for body part selected: mild   Tempie Donning, PT, DPT 10/24/2020, 11:54 AM  [facility] [address] [place], [place], [address] Phone: [phone]   Fax:  [phone]  Name: Katelyn Aguilar MRN: [id number] Date of Birth: 04-Oct-2000

## 2020-10-25 ENCOUNTER — Ambulatory Visit (INDEPENDENT_AMBULATORY_CARE_PROVIDER_SITE_OTHER): Payer: Medicaid Other | Admitting: Family Medicine

## 2020-10-25 ENCOUNTER — Encounter: Payer: Self-pay | Admitting: Family Medicine

## 2020-10-25 VITALS — BP 124/83 | HR 93 | Wt 330.0 lb

## 2020-10-25 DIAGNOSIS — F418 Other specified anxiety disorders: Secondary | ICD-10-CM

## 2020-10-25 DIAGNOSIS — Z30019 Encounter for initial prescription of contraceptives, unspecified: Secondary | ICD-10-CM

## 2020-10-25 DIAGNOSIS — N926 Irregular menstruation, unspecified: Secondary | ICD-10-CM | POA: Diagnosis not present

## 2020-10-25 DIAGNOSIS — H6642 Suppurative otitis media, unspecified, left ear: Secondary | ICD-10-CM | POA: Diagnosis not present

## 2020-10-25 LAB — POCT URINE PREGNANCY: Preg Test, Ur: NEGATIVE

## 2020-10-25 MED ORDER — ESCITALOPRAM OXALATE 10 MG PO TABS: ORAL_TABLET | ORAL | 0 refills | Status: DC

## 2020-10-25 MED ORDER — FOLIC ACID 400 MCG PO TABS
400.0000 ug | ORAL_TABLET | Freq: Every day | ORAL | 3 refills | Status: DC
Start: 2020-10-25 — End: 2020-12-18

## 2020-10-25 NOTE — Progress Notes (Signed)
    SUBJECTIVE:   CHIEF COMPLAINT / HPI: forgot what visit was for  Recently seen in ED for left ear infection, treated with antibiotics for 10 days.  Completed course 09/23.  Would like recheck of ear.  Denies any fevers, ear pain, discharge or decrease in hearing.  Had a cold which is now resolving.  Amenorrhea Depo stopped 1-2 months.  No menses since.  Sexually active.  Not using any for of BCP.    Mood disorder Doing well.  Planning to find a new counselor that suits her better.  Tolerating medications well.  Does not want to adjust dosing.  Denies any SI/HI  PERTINENT  PMH / PSH:  Anxiety disorder  OBJECTIVE:   BP 124/83   Pulse 93   Wt (!) 330 lb (149.7 kg)   LMP  (LMP Unknown) Comment: States has not had period since stopping depo 1-2 months ago.  SpO2 98%   BMI 58.46 kg/m    General: Alert, no acute distress HEENT:  TM's visible bilaterally, no erythema, bulging or drainage.  No pain over tragus or post auricular. Cardio: Normal S1 and S2, RRR, no r/m/g Pulm: CTAB, normal work of breathing   ASSESSMENT/PLAN:   Left otitis media Completed treatment and now resolved Follow up as needed  Irregular menses Amenorrhea for 2 months after stopping Depo UPT negative Declined other options for birth control Desires pregnancy Folic Acid 400 mcg daily Scheduled for initial PAP 10/31   Anxiety with depression GAD 10. PHQ 12.  No SI/HI. -Lexapro 10 mg refilled.  -Continue Atarax as needed -Abilify for TICS, if increasing will need to see Neurologist to adjust medication -Encourage to find therapist that will meet her needs -Follow up as needed -Strict return precautions provided -She has 24 hr crisis line     Dana Allan, MD [facility]

## 2020-10-25 NOTE — Patient Instructions (Addendum)
Thank you for coming to see me today. It was a pleasure.   Your pregnancy test is negative. Left ear looks good, no infection  Continue with Neuro rehab for dizziness  Continue Therapy for Anxiety Refill sent for Lexapro  PAP is booked for Oct 31 at 230pm  Please follow-up with PCP as needed  If you have any questions or concerns, please do not hesitate to call the office at [phone].  Best,   Dana Allan, MD

## 2020-10-26 ENCOUNTER — Ambulatory Visit (HOSPITAL_COMMUNITY): Payer: Medicaid Other | Admitting: Psychiatry

## 2020-10-27 ENCOUNTER — Encounter: Payer: Self-pay | Admitting: Family Medicine

## 2020-10-27 DIAGNOSIS — H6692 Otitis media, unspecified, left ear: Secondary | ICD-10-CM | POA: Insufficient documentation

## 2020-10-27 NOTE — Assessment & Plan Note (Signed)
Completed treatment and now resolved Follow up as needed

## 2020-10-27 NOTE — Assessment & Plan Note (Signed)
Amenorrhea for 2 months after stopping Depo UPT negative Declined other options for birth control Desires pregnancy Folic Acid 400 mcg daily Scheduled for initial PAP 10/31

## 2020-10-27 NOTE — Assessment & Plan Note (Signed)
GAD 10. PHQ 12.  No SI/HI. -Lexapro 10 mg refilled.  -Continue Atarax as needed -Abilify for TICS, if increasing will need to see Neurologist to adjust medication -Encourage to find therapist that will meet her needs -Follow up as needed -Strict return precautions provided -She has 24 hr crisis line

## 2020-10-29 DIAGNOSIS — Z419 Encounter for procedure for purposes other than remedying health state, unspecified: Secondary | ICD-10-CM | POA: Diagnosis not present

## 2020-11-01 ENCOUNTER — Ambulatory Visit: Payer: Medicaid Other

## 2020-11-08 ENCOUNTER — Ambulatory Visit: Payer: Medicaid Other | Attending: Family Medicine | Admitting: Physical Therapy

## 2020-11-08 ENCOUNTER — Other Ambulatory Visit: Payer: Self-pay

## 2020-11-08 DIAGNOSIS — R42 Dizziness and giddiness: Secondary | ICD-10-CM | POA: Insufficient documentation

## 2020-11-08 DIAGNOSIS — R2681 Unsteadiness on feet: Secondary | ICD-10-CM | POA: Diagnosis not present

## 2020-11-08 NOTE — Therapy (Signed)
[facility] [address] [place], [place], [address] Phone: [phone]   Fax:  [phone]  Physical Therapy Treatment  Patient Details  Name: Katelyn Aguilar MRN: [id number] Date of Birth: 28-Mar-2000 Referring Provider (PT): Pearlean Brownie, MD   Encounter Date: 11/08/2020   PT End of Session - 11/08/20 1107     Visit Number 2    Number of Visits 5    Date for PT Re-Evaluation --   at 5th visit   Authorization Type [employer] (VL: 27)    Authorization Time Period awaiting authorization    PT Start Time 1105    PT Stop Time 1145    PT Time Calculation (min) 40 min    Activity Tolerance Patient tolerated treatment well    Behavior During Therapy [facility] for tasks assessed/performed             Past Medical History:  Diagnosis Date   Allergic rhinitis    Anxiety    Anxiety 11/17/2018   Dysrhythmia    tachycardia   GERD (gastroesophageal reflux disease)    Nausea and vomiting 04/06/2019   Obesity    Pilonidal abscess 12/04/2019   Pilonidal cyst without abscess 04/18/2020   Pyelonephritis 09/15/2019    Past Surgical History:  Procedure Laterality Date   BIOPSY  04/19/2020   Procedure: BIOPSY;  Surgeon: Tressia Danas, MD;  Location: [facility];  Service: Gastroenterology;;   ESOPHAGOGASTRODUODENOSCOPY     ESOPHAGOGASTRODUODENOSCOPY (EGD) WITH PROPOFOL N/A 04/19/2020   Procedure: ESOPHAGOGASTRODUODENOSCOPY (EGD) WITH PROPOFOL;  Surgeon: Tressia Danas, MD;  Location: [facility];  Service: Gastroenterology;  Laterality: N/A;   INCISION AND DRAINAGE ABSCESS N/A 04/26/2020   Procedure: excision of pilonidal cyst with open packing;  Surgeon: Darnell Level, MD;  Location: [facility];  Service: General;  Laterality: N/A;   NO PAST SURGERIES      There were no vitals filed for this visit.   Subjective Assessment - 11/08/20 1108     Subjective Pt reports increased dizziness after evaluation that lasted  rest of the day, on and off.  Symptoms have stayed the same over the past two weeks.  Intermittent onset, mostly at night.  Pt typically sleeps in and then stays up late.  Has acid reflux but it is well controlled - has crackles in back of throat but not burning.    Pertinent History Anxiety, GERD, Obesity    Limitations Walking;House hold activities    Patient Stated Goals Improve the Dizziness    Currently in Pain? No/denies               Vestibular Assessment - 11/08/20 1114       Other Tests   Comments MCTSIB: 30 seconds all 4 conditions, mild sway      Orthostatics   BP supine (x 5 minutes) 134/86    HR supine (x 5 minutes) 100    BP standing (after 1 minute) 153/92    HR standing (after 1 minute) 128    BP standing (after 3 minutes) 145/85    HR standing (after 3 minutes) 125                Vestibular Treatment/Exercise - 11/08/20 1140       Vestibular Treatment/Exercise   Vestibular Treatment Provided Habituation    Habituation Exercises Standing Vertical Head Turns      Standing Vertical Head Turns   Number of Reps  2    Symptom Description  performed  bending down to R and L x 2 reps each side to retrieve item from floor.  After 2nd set pt began to experience significant blurring of vision and room "teeter tottering", re-assessed BP after habituation: 131/84, HR: 134                PT Education - 11/08/20 1202     Education Details if no vestibular findings PT may refer pt back to PCP to address BP changes or assess if GERD could be contributing to dizziness; no HEP this session    Person(s) Educated Patient    Methods Explanation    Comprehension Verbalized understanding              PT Short Term Goals - 10/24/20 1146       PT SHORT TERM GOAL #1   Title = LTGs               PT Long Term Goals - 11/08/20 1152       PT LONG TERM GOAL #1   Title Patient will be independent with final vestibular/balance HEP (ALL LTGs Due at 5th  Visit)    Baseline no HEP established    Time 4    Period Weeks    Status New      PT LONG TERM GOAL #2   Title LTG to be set for M-CTSIB as applicable    Baseline Deferred, MCTSIB WNL    Status Deferred      PT LONG TERM GOAL #3   Title Patient will report </= 1 on all components of MSQ to demo improved activity tolerance    Baseline 1-2/5    Time 4    Period Weeks    Status New                   Plan - 11/08/20 1154     Clinical Impression Statement Continued assessment of dizziness and if symptoms are vestibular in nature.  Performed MCTSIB -all 4 conditions WNL.  When initiating habituation exercises for bending forwards to the floor pt experience significant symptoms after 2 reps and reported a feeling of lightheadedness.  Continued assessment with orthostatic assessment - pt did not experience a drop in BP but did experience a significant increase in BP and HR with supine > standing; BP and HR Remained elevated in standing after 3 minutes.  Returned to bending down to the ground multiple times with pt experiencing a drop in BP but increase in HR that corresponded with symptoms.  Will report findings back to PCP to determine if pt needs further assessment of BP.  No HEP provided today.    Personal Factors and Comorbidities Comorbidity 3+;Time since onset of injury/illness/exacerbation    Comorbidities Anxiety, GERD, Obesity, Tics    Examination-Activity Limitations Bend;Stand;Locomotion Level    Examination-Participation Restrictions Interpersonal Relationship;Occupation;Community Activity    Stability/Clinical Decision Making Stable/Uncomplicated    Rehab Potential Good    PT Frequency 1x / week    PT Duration 4 weeks    PT Treatment/Interventions ADLs/Self Care Home Management;Canalith Repostioning;Cryotherapy;Electrical Stimulation;Moist Heat;Gait training;Stair training;Functional mobility training;Therapeutic activities;Therapeutic exercise;Balance  training;Neuromuscular re-education;Patient/family education;Manual techniques;Vestibular;Passive range of motion    PT Next Visit Plan MCTSIB was WNL.  Check BP and HR when pt has symptoms.  Did she speak with PCP about BP/HR.  Initiate HEP focused on balance and habituation to head turns, bending    Consulted and Agree with Plan of Care Patient  Patient will benefit from skilled therapeutic intervention in order to improve the following deficits and impairments:  Decreased balance, Decreased activity tolerance, Dizziness  Visit Diagnosis: Dizziness and giddiness  Unsteadiness on feet   Problem List Patient Active Problem List   Diagnosis Date Noted   Left otitis media 10/27/2020   Severe episode of recurrent major depressive disorder, 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   GAD (generalized anxiety disorder) 10/04/2020   Ingrown toenail of both feet 08/19/2020   Pain of left breast 06/26/2020   BMI 60.0-69.9, adult (HCC) 05/19/2020   Contraception management 05/19/2020   Anxiety with depression 05/09/2020   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/26/2020   Tachycardia 02/28/2020   Fatigue 02/28/2020   Screen for STD (sexually transmitted disease) 01/12/2020   Irregular menses 12/04/2019   Dizziness 04/22/2019   Cramp in limb 04/10/2019   Simple tics 11/17/2018   Involuntary movements 10/21/2018   Encounter for birth control 10/21/2018    Dierdre Highman, PT, DPT 11/08/20    12:06 PM  [facility] [address] [place], [place], [address] Phone: [phone]   Fax:  [phone]  Name: Katelyn Aguilar MRN: [id number] Date of Birth: 2000-09-03

## 2020-11-15 ENCOUNTER — Ambulatory Visit: Payer: Medicaid Other

## 2020-11-16 ENCOUNTER — Other Ambulatory Visit: Payer: Self-pay

## 2020-11-16 ENCOUNTER — Ambulatory Visit (INDEPENDENT_AMBULATORY_CARE_PROVIDER_SITE_OTHER): Payer: Medicaid Other | Admitting: Licensed Clinical Social Worker

## 2020-11-16 DIAGNOSIS — F332 Major depressive disorder, recurrent severe without psychotic features: Secondary | ICD-10-CM

## 2020-11-16 DIAGNOSIS — F411 Generalized anxiety disorder: Secondary | ICD-10-CM

## 2020-11-16 NOTE — Progress Notes (Signed)
   THERAPIST PROGRESS NOTE  Session Time: 64   Virtual Visit via Video Note  I connected with Katelyn Aguilar on 11/16/20 at  2:00 PM EDT by a video enabled telemedicine application and verified that I am speaking with the correct person using two identifiers.  Location: Patient: Katelyn Aguilar  Provider: [facility]   I discussed the limitations of evaluation and management by telemedicine and the availability of in person appointments. The patient expressed understanding and agreed to proceed.     I discussed the assessment and treatment plan with the patient. The patient was provided an opportunity to ask questions and all were answered. The patient agreed with the plan and demonstrated an understanding of the instructions.   The patient was advised to call back or seek an in-person evaluation if the symptoms worsen or if the condition fails to improve as anticipated.  I provided 30 minutes of non-face-to-face time during this encounter.   Katelyn Cooks, LCSW   Participation Level: Active  Behavioral Response: CasualAlertAnxious and Depressed  Type of Therapy: Individual Therapy  Treatment Goals addressed: Anxiety and Diagnosis: depression  Interventions: CBT, Motivational Interviewing, and Supportive  Summary: Katelyn Aguilar is a 20 y.o. female who presents with depressed and anxious mood\affect.  Patient was alert and oriented x5.  Katelyn Aguilar was pleasant, cooperative, and maintained good eye contact.  Patient was distractible in session as evidenced by avoiding eye contact and long periods of silence in between questions.  LCSW did confront patient about this and she said that "my cat is needing a lot of attention right now sorry".  CSW and patient spoke about her homework for the past 4 weeks.  Patient was supposed to utilize positive affirmations and utilize her planner.  Katelyn Aguilar states that she did not receive the 50 positive affirmations  that LCSW was supposed to email to her.  LCSW did review his email and upon review saw that it was not signed and saved in draft.  Goal/objective moving forward will be to utilize positive affirmations at least 4 out of the 7 days/week.  Katelyn Aguilar spoke about utilizing her planner more as she has gotten away from it.  Goal\objective for patient utilizing her planner will be to utilize it at least 3 out of the 7 days/week.  Suicidal/Homicidal: NAwithout intent/plan  Therapist Response:    Intervention/Plan: Interventions utilized in today's session was supportive therapy, cognitive behavioral therapy, and person centered therapy.  LCSW administered GAD-7 previous scores were 21 at initial assessment, 19 at first follow-up appointment, and today's score was a 15.  LCSW also administered a PHQ-9 original score for patient was a 20 at initial assessment, 17 at first follow-up visit, and 19 today.  Patient does report an increase in depressive symptoms for hopelessness, worthlessness, irritability, and tearfulness.  Patient states that she has not been utilizing coping skills taught in session and is agreeable to start time back up as evidenced by note written above.  Plan: Return again in 4 weeks.     Katelyn Cooks, LCSW 11/16/2020

## 2020-11-22 ENCOUNTER — Ambulatory Visit: Payer: Medicaid Other

## 2020-11-23 ENCOUNTER — Ambulatory Visit (HOSPITAL_COMMUNITY): Payer: Medicaid Other | Admitting: Psychiatry

## 2020-11-24 ENCOUNTER — Ambulatory Visit (HOSPITAL_COMMUNITY): Payer: Medicaid Other | Admitting: Student in an Organized Health Care Education/Training Program

## 2020-11-28 ENCOUNTER — Ambulatory Visit (INDEPENDENT_AMBULATORY_CARE_PROVIDER_SITE_OTHER): Payer: Medicaid Other | Admitting: Family Medicine

## 2020-11-28 ENCOUNTER — Ambulatory Visit (INDEPENDENT_AMBULATORY_CARE_PROVIDER_SITE_OTHER): Payer: Medicaid Other

## 2020-11-28 ENCOUNTER — Other Ambulatory Visit: Payer: Self-pay

## 2020-11-28 ENCOUNTER — Other Ambulatory Visit (HOSPITAL_COMMUNITY)
Admission: RE | Admit: 2020-11-28 | Discharge: 2020-11-28 | Disposition: A | Discharge status: Home / Self Care | Payer: Medicaid Other | Source: Ambulatory Visit | Attending: Family Medicine | Admitting: Family Medicine

## 2020-11-28 ENCOUNTER — Encounter: Payer: Self-pay | Admitting: Family Medicine

## 2020-11-28 VITALS — BP 130/100 | HR 102 | Ht 63.0 in | Wt 329.4 lb

## 2020-11-28 DIAGNOSIS — R030 Elevated blood-pressure reading, without diagnosis of hypertension: Secondary | ICD-10-CM | POA: Diagnosis not present

## 2020-11-28 DIAGNOSIS — Z23 Encounter for immunization: Secondary | ICD-10-CM | POA: Diagnosis not present

## 2020-11-28 DIAGNOSIS — Z1272 Encounter for screening for malignant neoplasm of vagina: Secondary | ICD-10-CM | POA: Insufficient documentation

## 2020-11-28 DIAGNOSIS — Z124 Encounter for screening for malignant neoplasm of cervix: Secondary | ICD-10-CM | POA: Diagnosis not present

## 2020-11-28 LAB — POCT URINE PREGNANCY: Preg Test, Ur: NEGATIVE

## 2020-11-28 MED ORDER — BLOOD PRESSURE KIT: PACK | 0 refills | Status: DC

## 2020-11-28 NOTE — Patient Instructions (Addendum)
Thank you for coming to see me today. It was a pleasure.   Monitor blood pressure 2-3 times a week  Follow up with PCP in 3-4 weeks with blood pressure readings  If blood pressure greater that 170/100 please call PCP or go to ED for evaluation  Will MyChart you with results of pregnancy test and PAP results  If you have any questions or concerns, please do not hesitate to call the office at [phone].  Best,   Dana Allan, MD

## 2020-11-28 NOTE — Progress Notes (Signed)
    SUBJECTIVE:   CHIEF COMPLAINT / HPI:   First PAP today Unsure when LMP Sexually active with one partner.  Actively trying to get pregnant  PERTINENT  PMH / PSH:  Anxiety Simple Tics Class 3 Obesity  OBJECTIVE:   BP (!) 130/100   Pulse (!) 102   Ht _0  (1.6 m)   Wt (!) 329 lb 6.4 oz (149.4 kg)   LMP  (LMP Unknown)   SpO2 98%   BMI 58.35 kg/m    General: Alert, no acute distress Pelvic Exam chaperoned by CMA Alexis        External: normal female genitalia without lesions or masses        Vagina: normal without lesions or masses        Cervix: normal without lesions or masses          ASSESSMENT/PLAN:   Cervical cancer screening UPT negative Declined birth control methods Pap smear: performed Declined Wet prep, GC/Chlamydia obtained Follow up with results  Elevated blood pressure reading Elevated BP today.  Review of chart does not reflect previous hypertension. Likely multifactorial.  Reports increased stress lately.   Will have patient monitor BP -BP kit ordered. -Strict return precautions provided -Follow up in 2-3 weeks    Carollee Leitz, MD [place]

## 2020-11-28 NOTE — Progress Notes (Signed)
.  pap

## 2020-11-29 ENCOUNTER — Encounter: Payer: Self-pay | Admitting: Family Medicine

## 2020-11-29 DIAGNOSIS — Z124 Encounter for screening for malignant neoplasm of cervix: Secondary | ICD-10-CM | POA: Insufficient documentation

## 2020-11-29 DIAGNOSIS — R030 Elevated blood-pressure reading, without diagnosis of hypertension: Secondary | ICD-10-CM | POA: Insufficient documentation

## 2020-11-29 DIAGNOSIS — Z419 Encounter for procedure for purposes other than remedying health state, unspecified: Secondary | ICD-10-CM | POA: Diagnosis not present

## 2020-11-29 LAB — CYTOLOGY - PAP: Diagnosis: NEGATIVE

## 2020-11-29 NOTE — Assessment & Plan Note (Signed)
Elevated BP today.  Review of chart does not reflect previous hypertension. Likely multifactorial.  Reports increased stress lately.   Will have patient monitor BP -BP kit ordered. -Strict return precautions provided -Follow up in 2-3 weeks  

## 2020-11-29 NOTE — Assessment & Plan Note (Signed)
UPT negative Declined birth control methods Pap smear: performed Declined Wet prep, GC/Chlamydia obtained Follow up with results

## 2020-12-15 ENCOUNTER — Ambulatory Visit (HOSPITAL_COMMUNITY): Payer: Medicaid Other | Admitting: Licensed Clinical Social Worker

## 2020-12-17 ENCOUNTER — Encounter (HOSPITAL_COMMUNITY): Payer: Self-pay | Admitting: Emergency Medicine

## 2020-12-17 ENCOUNTER — Emergency Department (HOSPITAL_COMMUNITY)
Admission: EM | Admit: 2020-12-17 | Discharge: 2020-12-18 | Disposition: A | Discharge status: Home / Self Care | Payer: Medicaid Other | Attending: Emergency Medicine | Admitting: Emergency Medicine

## 2020-12-17 DIAGNOSIS — Z7722 Contact with and (suspected) exposure to environmental tobacco smoke (acute) (chronic): Secondary | ICD-10-CM | POA: Insufficient documentation

## 2020-12-17 DIAGNOSIS — Z20822 Contact with and (suspected) exposure to covid-19: Secondary | ICD-10-CM | POA: Diagnosis not present

## 2020-12-17 DIAGNOSIS — J101 Influenza due to other identified influenza virus with other respiratory manifestations: Secondary | ICD-10-CM | POA: Insufficient documentation

## 2020-12-17 DIAGNOSIS — R000 Tachycardia, unspecified: Secondary | ICD-10-CM | POA: Insufficient documentation

## 2020-12-17 DIAGNOSIS — R059 Cough, unspecified: Secondary | ICD-10-CM | POA: Diagnosis not present

## 2020-12-17 DIAGNOSIS — N9489 Other specified conditions associated with female genital organs and menstrual cycle: Secondary | ICD-10-CM | POA: Insufficient documentation

## 2020-12-17 NOTE — ED Triage Notes (Signed)
Pt reports N/V, congestion, cough and chills that started this morning. States that her fiance had a fever yesterday and last night. She took a COVID test that was negative. States that her fiance was negative for flu and COVID as well. She states that she always has a high heart rate.

## 2020-12-18 ENCOUNTER — Emergency Department (HOSPITAL_COMMUNITY): Payer: Medicaid Other

## 2020-12-18 ENCOUNTER — Encounter (HOSPITAL_COMMUNITY): Payer: Self-pay | Admitting: Student

## 2020-12-18 DIAGNOSIS — R059 Cough, unspecified: Secondary | ICD-10-CM | POA: Diagnosis not present

## 2020-12-18 LAB — COMPREHENSIVE METABOLIC PANEL
ALT: 40 U/L (ref 0–44)
AST: 29 U/L (ref 15–41)
Albumin: 4.4 g/dL (ref 3.5–5.0)
Alkaline Phosphatase: 71 U/L (ref 38–126)
Anion gap: 11 (ref 5–15)
BUN: 9 mg/dL (ref 6–20)
CO2: 22 mmol/L (ref 22–32)
Calcium: 8.9 mg/dL (ref 8.9–10.3)
Chloride: 103 mmol/L (ref 98–111)
Creatinine, Ser: 0.78 mg/dL (ref 0.44–1.00)
GFR, Estimated: >60 mL/min (ref >60)
Glucose, Bld: 100 mg/dL — ABNORMAL HIGH (ref 70–99)
Potassium: 3.5 mmol/L (ref 3.5–5.1)
Sodium: 136 mmol/L (ref 135–145)
Total Bilirubin: 1 mg/dL (ref 0.3–1.2)
Total Protein: 7.5 g/dL (ref 6.5–8.1)

## 2020-12-18 LAB — CBC WITH DIFFERENTIAL/PLATELET
Abs Immature Granulocytes: 0.03 10*3/uL (ref 0.00–0.07)
Basophils Absolute: 0 10*3/uL (ref 0.0–0.1)
Basophils Relative: 0 %
Eosinophils Absolute: 0 10*3/uL (ref 0.0–0.5)
Eosinophils Relative: 0 %
HCT: 39.1 % (ref 36.0–46.0)
Hemoglobin: 13 g/dL (ref 12.0–15.0)
Immature Granulocytes: 0 %
Lymphocytes Relative: 5 %
Lymphs Abs: 0.5 10*3/uL — ABNORMAL LOW (ref 0.7–4.0)
MCH: 27.8 pg (ref 26.0–34.0)
MCHC: 33.2 g/dL (ref 30.0–36.0)
MCV: 83.7 fL (ref 80.0–100.0)
Monocytes Absolute: 0.8 10*3/uL (ref 0.1–1.0)
Monocytes Relative: 8 %
Neutro Abs: 8.7 10*3/uL — ABNORMAL HIGH (ref 1.7–7.7)
Neutrophils Relative %: 87 %
Platelets: 245 10*3/uL (ref 150–400)
RBC: 4.67 MIL/uL (ref 3.87–5.11)
RDW: 13.3 % (ref 11.5–15.5)
WBC: 10.1 10*3/uL (ref 4.0–10.5)
nRBC: 0 % (ref 0.0–0.2)

## 2020-12-18 LAB — URINALYSIS, ROUTINE W REFLEX MICROSCOPIC
Bilirubin Urine: NEGATIVE
Glucose, UA: NEGATIVE mg/dL
Ketones, ur: NEGATIVE mg/dL
Nitrite: NEGATIVE
Specific Gravity, Urine: >1.03 — ABNORMAL HIGH (ref 1.005–1.030)
pH: 6 (ref 5.0–8.0)

## 2020-12-18 LAB — RESP PANEL BY RT-PCR (FLU A&B, COVID) ARPGX2
Influenza A by PCR: POSITIVE — AB
Influenza B by PCR: NEGATIVE
SARS Coronavirus 2 by RT PCR: NEGATIVE

## 2020-12-18 LAB — URINALYSIS, MICROSCOPIC (REFLEX)
RBC / HPF: NONE SEEN RBC/hpf (ref 0–5)
Squamous Epithelial / HPF: >50 (ref 0–5)
WBC, UA: NONE SEEN WBC/hpf (ref 0–5)

## 2020-12-18 LAB — LIPASE, BLOOD: Lipase: 25 U/L (ref 11–51)

## 2020-12-18 LAB — I-STAT BETA HCG BLOOD, ED (MC, WL, AP ONLY): I-stat hCG, quantitative: <5 m[IU]/mL (ref <5)

## 2020-12-18 MED ORDER — OSELTAMIVIR PHOSPHATE 75 MG PO CAPS: 75.0000 mg | ORAL_CAPSULE | Freq: Two times a day (BID) | ORAL | 0 refills | Status: DC

## 2020-12-18 MED ORDER — SODIUM CHLORIDE 0.9 % IV BOLUS
1000.0000 mL | Freq: Once | INTRAVENOUS | Status: AC
  Administered 2020-12-18: 1000 mL via INTRAVENOUS

## 2020-12-18 MED ORDER — DIPHENHYDRAMINE HCL 50 MG/ML IJ SOLN
12.5000 mg | Freq: Once | INTRAMUSCULAR | Status: AC
  Administered 2020-12-18: 12.5 mg via INTRAVENOUS
  Filled 2020-12-18: qty 1

## 2020-12-18 MED ORDER — SODIUM CHLORIDE 0.9 % IV BOLUS: 1000.0000 mL | Freq: Once | INTRAVENOUS | Status: DC

## 2020-12-18 MED ORDER — METOCLOPRAMIDE HCL 10 MG PO TABS: 10.0000 mg | ORAL_TABLET | Freq: Three times a day (TID) | ORAL | 0 refills | Status: DC | PRN

## 2020-12-18 MED ORDER — FAMOTIDINE IN NACL 20-0.9 MG/50ML-% IV SOLN
20.0000 mg | Freq: Once | INTRAVENOUS | Status: AC
  Administered 2020-12-18: 20 mg via INTRAVENOUS
  Filled 2020-12-18: qty 50

## 2020-12-18 MED ORDER — ACETAMINOPHEN 325 MG PO TABS
650.0000 mg | ORAL_TABLET | Freq: Once | ORAL | Status: AC
  Administered 2020-12-18: 650 mg via ORAL
  Filled 2020-12-18: qty 2

## 2020-12-18 MED ORDER — SODIUM CHLORIDE 0.9 % IV BOLUS
2000.0000 mL | Freq: Once | INTRAVENOUS | Status: AC
  Administered 2020-12-18: 2000 mL via INTRAVENOUS

## 2020-12-18 MED ORDER — METOCLOPRAMIDE HCL 5 MG/ML IJ SOLN
10.0000 mg | Freq: Once | INTRAMUSCULAR | Status: AC
  Administered 2020-12-18: 10 mg via INTRAVENOUS
  Filled 2020-12-18: qty 2

## 2020-12-18 MED ORDER — ONDANSETRON HCL 4 MG/2ML IJ SOLN
4.0000 mg | Freq: Once | INTRAMUSCULAR | Status: AC
  Administered 2020-12-18: 4 mg via INTRAVENOUS
  Filled 2020-12-18: qty 2

## 2020-12-18 MED ORDER — BENZONATATE 100 MG PO CAPS: 100.0000 mg | ORAL_CAPSULE | Freq: Three times a day (TID) | ORAL | 0 refills | Status: DC

## 2020-12-18 NOTE — Discharge Instructions (Addendum)
You were seen in the emergency department today for cough with nausea and vomiting.  Your influenza a test returned positive which we suspect is the cause of your symptoms.  We are sending home with Reglan to take every 8 hours as needed for nausea and vomiting, Tessalon to take every 8 hours as needed for cough, and Tamiflu to take for flu treatment.  We have prescribed you new medication(s) today. Discuss the medications prescribed today with your pharmacist as they can have adverse effects and interactions with your other medicines including over the counter and prescribed medications. Seek medical evaluation if you start to experience new or abnormal symptoms after taking one of these medicines, seek care immediately if you start to experience difficulty breathing, feeling of your throat closing, facial swelling, or rash as these could be indications of a more serious allergic reaction  Please try to stay well-hydrated drinking electrolyte containing fluids, please get plenty of rest.  Follow-up with your primary care provider within 3 days.  Return to the emergency department for new or worsening symptoms including but not limited to new or worsening pain, trouble breathing, passing out, coughing up blood, blood in vomit or stool, inability to keep fluids down, or any other concerns.

## 2020-12-18 NOTE — ED Provider Notes (Signed)
[facility] DEPT Provider Note   CSN: [id number] Arrival date & time: 12/17/20  2246     History Chief Complaint  Patient presents with   Cough    Katelyn Aguilar is a 20 y.o. female with a hx of anxiety, depression, and GERD who presents to the ED with complaints of cough and vomiting throughout the day today.  Patient reports that she has had chills,body aches, nasal congestion, throat irritation, cough, nausea, vomiting, diarrhea, and chest pain with coughing.  No significant alleviating or aggravating factors to her symptoms.  She has been able to keep minimal amounts of fluid down throughout the day.  Her boyfriend is sick with similar symptoms about a negative COVID/flu test, she states that she had a negative COVID.  Denies fever, hematemesis, melena, hematochezia, dyspnea, syncope, rashes, or leg swelling.  HPI     Past Medical History:  Diagnosis Date   Allergic rhinitis    Anxiety    Anxiety 11/17/2018   Dysrhythmia    tachycardia   GERD (gastroesophageal reflux disease)    Nausea and vomiting 04/06/2019   Obesity    Pilonidal abscess 12/04/2019   Pilonidal cyst without abscess 04/18/2020   Pyelonephritis 09/15/2019    Patient Active Problem List   Diagnosis Date Noted   Cervical cancer screening 11/29/2020   Elevated blood pressure reading 11/29/2020   Left otitis media 10/27/2020   Severe episode of recurrent major depressive disorder, without psychotic features ([place]) 10/04/2020   GAD (generalized anxiety disorder) 10/04/2020   Ingrown toenail of both feet 08/19/2020   Pain of left breast 06/26/2020   BMI 60.0-69.9, adult ([place]) 05/19/2020   Contraception management 05/19/2020   Anxiety with depression 05/09/2020   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/26/2020   Tachycardia 02/28/2020   Fatigue 02/28/2020   Screen for STD (sexually transmitted disease) 01/12/2020   Irregular menses 12/04/2019   Dizziness 04/22/2019   Cramp in limb  04/10/2019   Simple tics 11/17/2018   Involuntary movements 10/21/2018   Encounter for birth control 10/21/2018    Past Surgical History:  Procedure Laterality Date   BIOPSY  04/19/2020   Procedure: BIOPSY;  Surgeon: Thornton Park, MD;  Location: [facility];  Service: Gastroenterology;;   ESOPHAGOGASTRODUODENOSCOPY     ESOPHAGOGASTRODUODENOSCOPY (EGD) WITH PROPOFOL N/A 04/19/2020   Procedure: ESOPHAGOGASTRODUODENOSCOPY (EGD) WITH PROPOFOL;  Surgeon: Thornton Park, MD;  Location: [facility];  Service: Gastroenterology;  Laterality: N/A;   INCISION AND DRAINAGE ABSCESS N/A 04/26/2020   Procedure: excision of pilonidal cyst with open packing;  Surgeon: Armandina Gemma, MD;  Location: [facility];  Service: General;  Laterality: N/A;   NO PAST SURGERIES       OB History   No obstetric history on file.     Family History  Problem Relation Age of Onset   COPD Mother    Depression Mother    Cancer Mother        blood cancer   Diabetes Other    Colon cancer Neg Hx    Esophageal cancer Neg Hx    Pancreatic cancer Neg Hx    Stomach cancer Neg Hx     Social History   Tobacco Use   Smoking status: Never    Passive exposure: Yes   Smokeless tobacco: Never  Vaping Use   Vaping Use: Former  Substance Use Topics   Alcohol use: Yes    Comment: occasional   Drug use: No    Home Medications Prior to  Admission medications   Medication Sig Start Date End Date Taking? Authorizing Provider  acetaminophen (TYLENOL) 325 MG tablet Take 650 mg by mouth every 6 (six) hours as needed for moderate pain.    [provider]  ARIPiprazole (ABILIFY) 5 MG tablet Take 1 tablet (5 mg total) by mouth daily. 10/06/20   Ezequiel Essex, MD  Blood Pressure KIT Check blood pressure 2-3 times week 11/28/20   Carollee Leitz, MD  calcium carbonate (TUMS - DOSED IN MG ELEMENTAL CALCIUM) 500 MG chewable tablet Chew 1 tablet by mouth daily as needed for indigestion or heartburn.    [provider]  cetirizine (ZYRTEC) 10 MG tablet Take 1 tablet (10 mg total) by mouth daily. 11/10/19   Carollee Leitz, MD  escitalopram (LEXAPRO) 10 MG tablet TAKE 1 TABLET(10 MG) BY MOUTH DAILY 10/25/20   Carollee Leitz, MD  folic acid (FOLVITE) 194 MCG tablet Take 1 tablet (400 mcg total) by mouth daily. 10/25/20   Carollee Leitz, MD  hydrOXYzine (ATARAX/VISTARIL) 10 MG tablet Take 1 tablet (10 mg total) by mouth 2 (two) times daily as needed. May increase to three times a day after 1 week if needed 05/17/20   Carollee Leitz, MD  metoCLOPramide (REGLAN) 10 MG tablet Take 1 tablet (10 mg total) by mouth 4 (four) times daily. 08/04/20   Levin Erp, PA  pantoprazole (PROTONIX) 40 MG tablet Take 1 tablet (40 mg total) by mouth 2 (two) times daily. 03/09/20   Thornton Park, MD  scopolamine (TRANSDERM-SCOP) 1 MG/3DAYS Place 1 patch (1.5 mg total) onto the skin every 3 (three) days. 10/06/20   Ezequiel Essex, MD    Allergies    Patient has no known allergies.  Review of Systems   Review of Systems  Constitutional:  Positive for chills. Negative for fever.  HENT:  Positive for congestion and sore throat. Negative for ear pain.   Respiratory:  Positive for cough. Negative for shortness of breath.   Cardiovascular:  Positive for chest pain (w/ coughing). Negative for leg swelling.  Gastrointestinal:  Positive for diarrhea, nausea and vomiting. Negative for abdominal pain, anal bleeding, blood in stool and constipation.  Musculoskeletal:  Positive for myalgias.  Neurological:  Negative for syncope.  All other systems reviewed and are negative.  Physical Exam Updated Vital Signs BP (!) 152/87 (BP Location: Left Arm)   Pulse (!) 123   Temp 98.7 F (37.1 C) (Oral)   Resp (!) 22   LMP  (LMP Unknown)   SpO2 98%   Physical Exam Vitals and nursing note reviewed.  Constitutional:      General: She is not in acute distress.    Appearance: She is well-developed.  HENT:     Head: Normocephalic  and atraumatic.     Right Ear: Ear canal normal. Tympanic membrane is not perforated, erythematous, retracted or bulging.     Left Ear: Ear canal normal. Tympanic membrane is not perforated, erythematous, retracted or bulging.     Ears:     Comments: No mastoid erythema/swelling/tenderness.     Nose:     Right Sinus: No maxillary sinus tenderness or frontal sinus tenderness.     Left Sinus: No maxillary sinus tenderness or frontal sinus tenderness.     Mouth/Throat:     Mouth: Mucous membranes are dry.     Pharynx: Uvula midline. No oropharyngeal exudate or posterior oropharyngeal erythema.     Comments: Posterior oropharynx is symmetric appearing. Patient tolerating own secretions without difficulty. No  trismus. No drooling. No hot potato voice. No swelling beneath the tongue, submandibular compartment is soft.  Eyes:     General:        Right eye: No discharge.        Left eye: No discharge.     Conjunctiva/sclera: Conjunctivae normal.     Pupils: Pupils are equal, round, and reactive to light.  Cardiovascular:     Rate and Rhythm: Regular rhythm. Tachycardia present.     Heart sounds: No murmur heard. Pulmonary:     Effort: Pulmonary effort is normal. No respiratory distress.     Breath sounds: Normal breath sounds. No wheezing, rhonchi or rales.  Chest:     Chest wall: Tenderness (anterior chest wall) present.  Abdominal:     General: There is no distension.     Palpations: Abdomen is soft.     Tenderness: There is no abdominal tenderness. There is no guarding or rebound.  Musculoskeletal:     Cervical back: Normal range of motion and neck supple. No edema or rigidity.     Right lower leg: No edema.     Left lower leg: No edema.  Lymphadenopathy:     Cervical: No cervical adenopathy.  Skin:    General: Skin is warm and dry.     Findings: No rash.  Neurological:     Mental Status: She is alert.  Psychiatric:        Behavior: Behavior normal.    ED Results / Procedures  / Treatments   Labs (all labs ordered are listed, but only abnormal results are displayed) Labs Reviewed - No data to display  EKG None  Radiology DG Chest Portable 1 View  Result Date: 12/18/2020 CLINICAL DATA:  Cough and congestion EXAM: PORTABLE CHEST 1 VIEW COMPARISON:  09/28/2020 FINDINGS: The heart size and mediastinal contours are within normal limits. Both lungs are clear. The visualized skeletal structures are unremarkable. IMPRESSION: No active disease. Electronically Signed   By: Inez [name] M.D.   On: 12/18/2020 03:29    Procedures Procedures   Medications Ordered in ED Medications - No data to display  ED Course  I have reviewed the triage vital signs and the nursing notes.  Pertinent labs & imaging results that were available during my care of the patient were reviewed by me and considered in my medical decision making (see chart for details).    MDM Rules/Calculators/A&P                           Patient presents to the ED with complaints of flu like sxs.  Nontoxic, vitals with tachycardia, initial hypertension has improved.  Additional history obtained:  Additional history obtained from chart review & nursing note review.   Lab Tests:  I Ordered, reviewed, and interpreted labs, which included:  CBC/CMP/lipase: Unremarkable Pregnancy test: Negative COVID test: Negative Influenza a test: Positive UA: Many bacteria, leukocytes, nitrite negative, notably contaminated.   Imaging Studies ordered:  I ordered imaging studies which included chest x-ray, I independently reviewed, formal radiology impression shows:  No active disease  ED Course:  Patient positive for influenza A which I suspect is the primary cause of her symptoms.  She does have many bacteria in her urine with moderate leukocytes, however she does have notable contamination, she does not have urinary symptoms, but states that typically her UTIs present with back pain, she has had a mild amount  of back discomfort, will send  for culture, but do not feel this needs treatment at this time.  Her renal function is preserved and she has no significant electrolyte derangements.  She has had symptomatic improvement and is able to tolerate p.o. her heart rate is improved, she remains mildly tachycardic, repeat temp 100.2 likely contributory, she did just receive Tylenol.  She overall appears reasonable for discharge.  We discussed  risks/benefits of Tamiflu which she would like to proceed with, additional supportive care prescribed. I discussed results, treatment plan, need for follow-up, and return precautions with the patient. Provided opportunity for questions, patient confirmed understanding and is in agreement with plan.   Portions of this note were generated with [employer]. Dictation errors may occur despite best attempts at proofreading.  Final Clinical Impression(s) / ED Diagnoses Final diagnoses:  Influenza A    Rx / DC Orders ED Discharge Orders          Ordered    metoCLOPramide (REGLAN) 10 MG tablet  Every 8 hours PRN        12/18/20 0601    oseltamivir (TAMIFLU) 75 MG capsule  Every 12 hours        12/18/20 0601    benzonatate (TESSALON) 100 MG capsule  Every 8 hours        12/18/20 0601             Sahib Pella, Glynda Jaeger, PA-C 12/18/20 1515    Quintella Reichert, MD 12/18/20 [phone]

## 2020-12-19 ENCOUNTER — Telehealth: Payer: Self-pay

## 2020-12-19 LAB — URINE CULTURE

## 2020-12-19 NOTE — Telephone Encounter (Signed)
Transition Care Management Follow-up Telephone Call Date of discharge and from where: 12/18/2020 from [facility] How have you been since you were released from the hospital? Pt stated that she is feeling some better. Pt has started all rx'ed meds except the  Any questions or concerns? No  Items Reviewed: Did the pt receive and understand the discharge instructions provided? Yes  Medications obtained and verified? Yes  Other? No  Any new allergies since your discharge? No  Dietary orders reviewed? No Do you have support at home? Yes   Functional Questionnaire: (I = Independent and D = Dependent) ADLs: I Bathing/Dressing- I Meal Prep- I Eating- I Maintaining continence- I Transferring/Ambulation- I Managing Meds- I  Follow up appointments reviewed: PCP Hospital f/u appt confirmed? No   Specialist Hospital f/u appt confirmed? No   Are transportation arrangements needed? No  If their condition worsens, is the pt aware to call PCP or go to the Emergency Dept.? Yes Was the patient provided with contact information for the PCP's office or ED? Yes Was to pt encouraged to call back with questions or concerns? Yes

## 2020-12-21 ENCOUNTER — Emergency Department (HOSPITAL_COMMUNITY)
Admission: EM | Admit: 2020-12-21 | Discharge: 2020-12-21 | Disposition: A | Discharge status: Home / Self Care | Payer: Medicaid Other | Attending: Emergency Medicine | Admitting: Emergency Medicine

## 2020-12-21 ENCOUNTER — Encounter (HOSPITAL_COMMUNITY): Payer: Self-pay

## 2020-12-21 ENCOUNTER — Telehealth: Payer: Medicaid Other | Admitting: Physician Assistant

## 2020-12-21 ENCOUNTER — Emergency Department (HOSPITAL_COMMUNITY): Payer: Medicaid Other

## 2020-12-21 ENCOUNTER — Ambulatory Visit: Payer: Medicaid Other | Admitting: Family Medicine

## 2020-12-21 DIAGNOSIS — Z7722 Contact with and (suspected) exposure to environmental tobacco smoke (acute) (chronic): Secondary | ICD-10-CM | POA: Diagnosis not present

## 2020-12-21 DIAGNOSIS — R1031 Right lower quadrant pain: Secondary | ICD-10-CM | POA: Diagnosis not present

## 2020-12-21 DIAGNOSIS — R110 Nausea: Secondary | ICD-10-CM | POA: Insufficient documentation

## 2020-12-21 DIAGNOSIS — R1084 Generalized abdominal pain: Secondary | ICD-10-CM | POA: Diagnosis not present

## 2020-12-21 DIAGNOSIS — K760 Fatty (change of) liver, not elsewhere classified: Secondary | ICD-10-CM | POA: Diagnosis not present

## 2020-12-21 DIAGNOSIS — R1032 Left lower quadrant pain: Secondary | ICD-10-CM | POA: Diagnosis not present

## 2020-12-21 DIAGNOSIS — R3989 Other symptoms and signs involving the genitourinary system: Secondary | ICD-10-CM

## 2020-12-21 DIAGNOSIS — R109 Unspecified abdominal pain: Secondary | ICD-10-CM

## 2020-12-21 LAB — URINALYSIS, ROUTINE W REFLEX MICROSCOPIC
Bilirubin Urine: NEGATIVE
Glucose, UA: NEGATIVE mg/dL
Hgb urine dipstick: NEGATIVE
Ketones, ur: NEGATIVE mg/dL
Nitrite: NEGATIVE
Protein, ur: 30 mg/dL — AB
Specific Gravity, Urine: 1.023 (ref 1.005–1.030)
WBC, UA: >50 WBC/hpf — ABNORMAL HIGH (ref 0–5)
pH: 5 (ref 5.0–8.0)

## 2020-12-21 LAB — CBC WITH DIFFERENTIAL/PLATELET
Abs Immature Granulocytes: 0.03 10*3/uL (ref 0.00–0.07)
Basophils Absolute: 0 10*3/uL (ref 0.0–0.1)
Basophils Relative: 1 %
Eosinophils Absolute: 0.2 10*3/uL (ref 0.0–0.5)
Eosinophils Relative: 3 %
HCT: 39.7 % (ref 36.0–46.0)
Hemoglobin: 13.4 g/dL (ref 12.0–15.0)
Immature Granulocytes: 0 %
Lymphocytes Relative: 19 %
Lymphs Abs: 1.5 10*3/uL (ref 0.7–4.0)
MCH: 28.2 pg (ref 26.0–34.0)
MCHC: 33.8 g/dL (ref 30.0–36.0)
MCV: 83.4 fL (ref 80.0–100.0)
Monocytes Absolute: 0.7 10*3/uL (ref 0.1–1.0)
Monocytes Relative: 9 %
Neutro Abs: 5.6 10*3/uL (ref 1.7–7.7)
Neutrophils Relative %: 68 %
Platelets: 271 10*3/uL (ref 150–400)
RBC: 4.76 MIL/uL (ref 3.87–5.11)
RDW: 13.1 % (ref 11.5–15.5)
WBC: 8.1 10*3/uL (ref 4.0–10.5)
nRBC: 0 % (ref 0.0–0.2)

## 2020-12-21 LAB — I-STAT CHEM 8, ED
BUN: 4 mg/dL — ABNORMAL LOW (ref 6–20)
Calcium, Ion: 1.04 mmol/L — ABNORMAL LOW (ref 1.15–1.40)
Chloride: 105 mmol/L (ref 98–111)
Creatinine, Ser: 0.6 mg/dL (ref 0.44–1.00)
Glucose, Bld: 92 mg/dL (ref 70–99)
HCT: 40 % (ref 36.0–46.0)
Hemoglobin: 13.6 g/dL (ref 12.0–15.0)
Potassium: 3.6 mmol/L (ref 3.5–5.1)
Sodium: 141 mmol/L (ref 135–145)
TCO2: 22 mmol/L (ref 22–32)

## 2020-12-21 LAB — PREGNANCY, URINE: Preg Test, Ur: NEGATIVE

## 2020-12-21 MED ORDER — OXYCODONE-ACETAMINOPHEN 5-325 MG PO TABS
1.0000 | ORAL_TABLET | Freq: Once | ORAL | Status: AC
  Administered 2020-12-21: 1 via ORAL
  Filled 2020-12-21: qty 1

## 2020-12-21 MED ORDER — ONDANSETRON HCL 4 MG PO TABS: 4.0000 mg | ORAL_TABLET | Freq: Four times a day (QID) | ORAL | 0 refills | Status: DC

## 2020-12-21 MED ORDER — CEPHALEXIN 500 MG PO CAPS: 500.0000 mg | ORAL_CAPSULE | Freq: Two times a day (BID) | ORAL | 0 refills | Status: DC

## 2020-12-21 MED ORDER — ACETAMINOPHEN 500 MG PO TABS: 500.0000 mg | ORAL_TABLET | Freq: Four times a day (QID) | ORAL | 0 refills | Status: AC | PRN

## 2020-12-21 MED ORDER — IOHEXOL 350 MG/ML SOLN
80.0000 mL | Freq: Once | INTRAVENOUS | Status: AC | PRN
  Administered 2020-12-21: 80 mL via INTRAVENOUS

## 2020-12-21 MED ORDER — NAPROXEN 500 MG PO TABS: 500.0000 mg | ORAL_TABLET | Freq: Two times a day (BID) | ORAL | 0 refills | Status: DC

## 2020-12-21 MED ORDER — KETOROLAC TROMETHAMINE 30 MG/ML IJ SOLN
15.0000 mg | Freq: Once | INTRAMUSCULAR | Status: AC
  Administered 2020-12-21: 15 mg via INTRAVENOUS
  Filled 2020-12-21: qty 1

## 2020-12-21 NOTE — Discharge Instructions (Addendum)
We saw you in the ER for the pain in your back. All the results in the ER are normal, labs and imaging. We are not sure what is causing your symptoms. The workup in the ER is not complete, and is limited to screening for life threatening and emergent conditions only, so please see a primary care doctor for further evaluation.  Take the antibiotics if you start having urinary complaints. Please return to the ER if your symptoms worsen; you have increased pain, fevers, chills, inability to keep any medications down, confusion. Otherwise see the outpatient doctor as requested.

## 2020-12-21 NOTE — Addendum Note (Signed)
Addended by: [name] on: 12/21/2020 11:16 AM   Modules accepted: Level of Service

## 2020-12-21 NOTE — Progress Notes (Signed)
Virtual Visit Consent   Katelyn Aguilar, you are scheduled for a virtual visit with a [facility] provider today.     Just as with appointments in the office, your consent must be obtained to participate.  Your consent will be active for this visit and any virtual visit you may have with one of our providers in the next 365 days.     If you have a MyChart account, a copy of this consent can be sent to you electronically.  All virtual visits are billed to your insurance company just like a traditional visit in the office.    As this is a virtual visit, video technology does not allow for your provider to perform a traditional examination.  This may limit your provider's ability to fully assess your condition.  If your provider identifies any concerns that need to be evaluated in person or the need to arrange testing (such as labs, EKG, etc.), we will make arrangements to do so.     Although advances in technology are sophisticated, we cannot ensure that it will always work on either your end or our end.  If the connection with a video visit is poor, the visit may have to be switched to a telephone visit.  With either a video or telephone visit, we are not always able to ensure that we have a secure connection.     I need to obtain your verbal consent now.   Are you willing to proceed with your visit today?    Katelyn Aguilar has provided verbal consent on 12/21/2020 for a virtual visit (video or telephone).   Mar Daring, PA-C   Date: 12/21/2020 11:12 AM   Virtual Visit via Video Note   I, Mar Daring, connected with  Katelyn Aguilar  ([id number], [birth year]) on 12/21/20 at 11:00 AM EST by a video-enabled telemedicine application and verified that I am speaking with the correct person using two identifiers.  Location: Patient: Virtual Visit Location Patient: Home Provider: Virtual Visit Location Provider: Home Office   I discussed the limitations of evaluation and  management by telemedicine and the availability of in person appointments. The patient expressed understanding and agreed to proceed.    Interactive audio and video communications were attempted, although failed due to patient's inability to connect to video. Continued visit with audio only interaction with patient agreement.   History of Present Illness: Katelyn Aguilar is a 20 y.o. who identifies as a female who was assigned female at birth, and is being seen today for suspected UTI.  HPI: Urinary Tract Infection  This is a new problem. The current episode started in the past 7 days. The problem occurs every urination. The problem has been gradually worsening. The quality of the pain is described as aching, burning and stabbing. The pain is severe. Maximum temperature: subjective fever. The fever has been present for Less than 1 day. She is Sexually active. There is No history of pyelonephritis. Associated symptoms include chills, flank pain, nausea, sweats and vomiting. Pertinent negatives include no frequency, hematuria, hesitancy or urgency. She has tried NSAIDs for the symptoms. The treatment provided no relief.     Problems:  Patient Active Problem List   Diagnosis Date Noted   Cervical cancer screening 11/29/2020   Elevated blood pressure reading 11/29/2020   Left otitis media 10/27/2020   Severe episode of recurrent major depressive disorder, without psychotic features ([place]) 10/04/2020   GAD (generalized anxiety disorder) 10/04/2020  Ingrown toenail of both feet 08/19/2020   Pain of left breast 06/26/2020   BMI 60.0-69.9, adult ([place]) 05/19/2020   Contraception management 05/19/2020   Anxiety with depression 05/09/2020   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/26/2020   Tachycardia 02/28/2020   Fatigue 02/28/2020   Screen for STD (sexually transmitted disease) 01/12/2020   Irregular menses 12/04/2019   Dizziness 04/22/2019   Cramp in limb 04/10/2019   Simple tics 11/17/2018    Involuntary movements 10/21/2018   Encounter for birth control 10/21/2018    Allergies: No Known Allergies Medications:  Current Outpatient Medications:    albuterol (VENTOLIN HFA) 108 (90 Base) MCG/ACT inhaler, Inhale 1-2 puffs into the lungs every 6 (six) hours as needed for wheezing or shortness of breath., Disp: , Rfl:    ARIPiprazole (ABILIFY) 5 MG tablet, Take 1 tablet (5 mg total) by mouth daily., Disp: 90 tablet, Rfl: 0   benzonatate (TESSALON) 100 MG capsule, Take 1 capsule (100 mg total) by mouth every 8 (eight) hours., Disp: 21 capsule, Rfl: 0   Blood Pressure KIT, Check blood pressure 2-3 times week, Disp: 1 kit, Rfl: 0   calcium carbonate (TUMS - DOSED IN MG ELEMENTAL CALCIUM) 500 MG chewable tablet, Chew 1 tablet by mouth daily as needed for indigestion or heartburn., Disp: , Rfl:    cetirizine (ZYRTEC) 10 MG tablet, Take 1 tablet (10 mg total) by mouth daily., Disp: 30 tablet, Rfl: 11   escitalopram (LEXAPRO) 10 MG tablet, TAKE 1 TABLET(10 MG) BY MOUTH DAILY, Disp: 30 tablet, Rfl: 0   hydrOXYzine (ATARAX/VISTARIL) 10 MG tablet, Take 1 tablet (10 mg total) by mouth 2 (two) times daily as needed. May increase to three times a day after 1 week if needed, Disp: 90 tablet, Rfl: 0   ibuprofen (ADVIL) 600 MG tablet, Take 600 mg by mouth every 6 (six) hours as needed for headache or moderate pain., Disp: , Rfl:    metoCLOPramide (REGLAN) 10 MG tablet, Take 1 tablet (10 mg total) by mouth every 8 (eight) hours as needed for nausea., Disp: 10 tablet, Rfl: 0   oseltamivir (TAMIFLU) 75 MG capsule, Take 1 capsule (75 mg total) by mouth every 12 (twelve) hours., Disp: 10 capsule, Rfl: 0   pantoprazole (PROTONIX) 40 MG tablet, Take 1 tablet (40 mg total) by mouth 2 (two) times daily., Disp: 60 tablet, Rfl: 3   scopolamine (TRANSDERM-SCOP) 1 MG/3DAYS, Place 1 patch (1.5 mg total) onto the skin every 3 (three) days., Disp: 4 patch, Rfl: 0  Observations/Objective: Patient is well-developed,  well-nourished in no acute distress.   No labored breathing.  Speech is clear and coherent with logical content.  Patient is alert and oriented at baseline.    Assessment and Plan: 1. Suspected UTI  - Symptoms concerning for pyelonephritis or possible severe UTI with systemic symptoms.  - Advised patient to seek urgent care in person at ER for further evaluation  Follow Up Instructions: I discussed the assessment and treatment plan with the patient. The patient was provided an opportunity to ask questions and all were answered. The patient agreed with the plan and demonstrated an understanding of the instructions.  A copy of instructions were sent to the patient via MyChart unless otherwise noted below.    The patient was advised to call back or seek an in-person evaluation if the symptoms worsen or if the condition fails to improve as anticipated.  Time:  I spent 13 minutes with the patient via telehealth technology discussing the  above problems/concerns.    Mar Daring, PA-C

## 2020-12-21 NOTE — ED Triage Notes (Signed)
Pt arrived via POV, c/o bilateral flank pain and nausea. States this is normal presentation for her UTIs

## 2020-12-21 NOTE — ED Provider Notes (Signed)
[facility] DEPT Provider Note   CSN: [id number] Arrival date & time: 12/21/20  1144     History Chief Complaint  Patient presents with   Flank Pain    Katelyn Aguilar is a 20 y.o. female.  HPI    20 year old female comes in with chief complaint of bilateral flank pain with some nausea.  Patient was seen in the ER few days ago with URI-like symptoms.  At that time she had complained of back pain as well, however it was thought that the back pain was sequelae of her influenza.  Patient has had UTI with this type of back pain in the past.  Now she is feeling like she is getting over influenza.  However today her back pain was getting worse, specifically on the right side, prompting her to come to the ER.  Pain is in the flank region, radiates up towards the thoracic region.  She denies any burning with urination, urinary frequency, but often she has had UTI without the symptoms were   Past Medical History:  Diagnosis Date   Allergic rhinitis    Anxiety    Anxiety 11/17/2018   Dysrhythmia    tachycardia   GERD (gastroesophageal reflux disease)    Nausea and vomiting 04/06/2019   Obesity    Pilonidal abscess 12/04/2019   Pilonidal cyst without abscess 04/18/2020   Pyelonephritis 09/15/2019    Patient Active Problem List   Diagnosis Date Noted   Cervical cancer screening 11/29/2020   Elevated blood pressure reading 11/29/2020   Left otitis media 10/27/2020   Severe episode of recurrent major depressive disorder, without psychotic features ([place]) 10/04/2020   GAD (generalized anxiety disorder) 10/04/2020   Ingrown toenail of both feet 08/19/2020   Pain of left breast 06/26/2020   BMI 60.0-69.9, adult ([place]) 05/19/2020   Contraception management 05/19/2020   Anxiety with depression 05/09/2020   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/26/2020   Tachycardia 02/28/2020   Fatigue 02/28/2020   Screen for STD (sexually transmitted disease) 01/12/2020    Irregular menses 12/04/2019   Dizziness 04/22/2019   Cramp in limb 04/10/2019   Simple tics 11/17/2018   Involuntary movements 10/21/2018   Encounter for birth control 10/21/2018    Past Surgical History:  Procedure Laterality Date   BIOPSY  04/19/2020   Procedure: BIOPSY;  Surgeon: [place] Park, MD;  Location: [facility];  Service: Gastroenterology;;   ESOPHAGOGASTRODUODENOSCOPY     ESOPHAGOGASTRODUODENOSCOPY (EGD) WITH PROPOFOL N/A 04/19/2020   Procedure: ESOPHAGOGASTRODUODENOSCOPY (EGD) WITH PROPOFOL;  Surgeon: Thornton Park, MD;  Location: [facility];  Service: Gastroenterology;  Laterality: N/A;   INCISION AND DRAINAGE ABSCESS N/A 04/26/2020   Procedure: excision of pilonidal cyst with open packing;  Surgeon: Armandina Gemma, MD;  Location: [facility];  Service: General;  Laterality: N/A;   NO PAST SURGERIES       OB History   No obstetric history on file.     Family History  Problem Relation Age of Onset   COPD Mother    Depression Mother    Cancer Mother        blood cancer   Diabetes Other    Colon cancer Neg Hx    Esophageal cancer Neg Hx    Pancreatic cancer Neg Hx    Stomach cancer Neg Hx     Social History   Tobacco Use   Smoking status: Never    Passive exposure: Yes   Smokeless tobacco: Never  Vaping Use  Vaping Use: Former  Substance Use Topics   Alcohol use: Yes    Comment: occasional   Drug use: No    Home Medications Prior to Admission medications   Medication Sig Start Date End Date Taking? Authorizing Provider  acetaminophen (TYLENOL) 500 MG tablet Take 1 tablet (500 mg total) by mouth every 6 (six) hours as needed. 12/21/20  Yes Cheyrl Buley, MD  naproxen (NAPROSYN) 500 MG tablet Take 1 tablet (500 mg total) by mouth 2 (two) times daily. 12/21/20  Yes Varney Biles, MD  albuterol (VENTOLIN HFA) 108 (90 Base) MCG/ACT inhaler Inhale 1-2 puffs into the lungs every 6 (six) hours as needed for wheezing or shortness of breath.     [provider]  ARIPiprazole (ABILIFY) 5 MG tablet Take 1 tablet (5 mg total) by mouth daily. 10/06/20   Ezequiel Essex, MD  benzonatate (TESSALON) 100 MG capsule Take 1 capsule (100 mg total) by mouth every 8 (eight) hours. 12/18/20   Petrucelli, Glynda Jaeger, PA-C  Blood Pressure KIT Check blood pressure 2-3 times week 11/28/20   Carollee Leitz, MD  calcium carbonate (TUMS - DOSED IN MG ELEMENTAL CALCIUM) 500 MG chewable tablet Chew 1 tablet by mouth daily as needed for indigestion or heartburn.    [provider]  cetirizine (ZYRTEC) 10 MG tablet Take 1 tablet (10 mg total) by mouth daily. 11/10/19   Carollee Leitz, MD  escitalopram (LEXAPRO) 10 MG tablet TAKE 1 TABLET(10 MG) BY MOUTH DAILY 10/25/20   Carollee Leitz, MD  hydrOXYzine (ATARAX/VISTARIL) 10 MG tablet Take 1 tablet (10 mg total) by mouth 2 (two) times daily as needed. May increase to three times a day after 1 week if needed 05/17/20   Carollee Leitz, MD  ibuprofen (ADVIL) 600 MG tablet Take 600 mg by mouth every 6 (six) hours as needed for headache or moderate pain.    [provider]  metoCLOPramide (REGLAN) 10 MG tablet Take 1 tablet (10 mg total) by mouth every 8 (eight) hours as needed for nausea. 12/18/20   Petrucelli, Glynda Jaeger, PA-C  oseltamivir (TAMIFLU) 75 MG capsule Take 1 capsule (75 mg total) by mouth every 12 (twelve) hours. 12/18/20   Petrucelli, Samantha R, PA-C  pantoprazole (PROTONIX) 40 MG tablet Take 1 tablet (40 mg total) by mouth 2 (two) times daily. 03/09/20   [place], MD  scopolamine (TRANSDERM-SCOP) 1 MG/3DAYS Place 1 patch (1.5 mg total) onto the skin every 3 (three) days. 10/06/20   Ezequiel Essex, MD    Allergies    Patient has no known allergies.  Review of Systems   Review of Systems  Constitutional:  Positive for activity change.  Musculoskeletal:  Positive for back pain.  All other systems reviewed and are negative.  Physical Exam Updated Vital Signs BP (!) 141/79   Pulse  96   Temp 98.7 F (37.1 C) (Oral)   Resp 18   LMP  (LMP Unknown)   SpO2 99%   Physical Exam Vitals and nursing note reviewed.  Constitutional:      Appearance: She is well-developed.  HENT:     Head: Atraumatic.  Cardiovascular:     Rate and Rhythm: Normal rate.  Pulmonary:     Effort: Pulmonary effort is normal.  Abdominal:     Tenderness: There is no abdominal tenderness.  Musculoskeletal:     Cervical back: Normal range of motion and neck supple.  Skin:    General: Skin is warm and dry.  Neurological:  Mental Status: She is alert and oriented to person, place, and time.    ED Results / Procedures / Treatments   Labs (all labs ordered are listed, but only abnormal results are displayed) Labs Reviewed  URINALYSIS, ROUTINE W REFLEX MICROSCOPIC - Abnormal; Notable for the following components:      Result Value   APPearance TURBID (*)    Protein, ur 30 (*)    Leukocytes,Ua SMALL (*)    WBC, UA >50 (*)    Bacteria, UA MANY (*)    All other components within normal limits  I-STAT CHEM 8, ED - Abnormal; Notable for the following components:   BUN 4 (*)    Calcium, Ion 1.04 (*)    All other components within normal limits  PREGNANCY, URINE  CBC WITH DIFFERENTIAL/PLATELET    EKG None  Radiology CT ABDOMEN PELVIS W CONTRAST  Result Date: 12/21/2020 CLINICAL DATA:  Right lower quadrant pain and right back pain EXAM: CT ABDOMEN AND PELVIS WITH CONTRAST TECHNIQUE: Multidetector CT imaging of the abdomen and pelvis was performed using the standard protocol following bolus administration of intravenous contrast. CONTRAST:  59m OMNIPAQUE IOHEXOL 350 MG/ML SOLN COMPARISON:  08/05/2020 FINDINGS: Lower chest: There are a few scattered foci of ground-glass opacity within the right lower lobe. Left lung bases clear. Heart size is normal. Hepatobiliary: Diffusely decreased attenuation of the hepatic parenchyma compatible with hepatic steatosis. No focal liver lesion identified.  Unremarkable gallbladder. No hyperdense gallstone. No biliary dilatation. Pancreas: Unremarkable. No pancreatic ductal dilatation or surrounding inflammatory changes. Spleen: Normal in size without focal abnormality. Adrenals/Urinary Tract: Unremarkable adrenal glands. Kidneys enhance symmetrically without focal lesion, stone, or hydronephrosis. Ureters are nondilated. Urinary bladder appears unremarkable for the degree of distension. Stomach/Bowel: Stomach is within normal limits. Appendix appears normal (series 2, image 62). No evidence of bowel wall thickening, distention, or inflammatory changes. Vascular/Lymphatic: No significant vascular findings are present. No pathologically enlarged abdominal or pelvic lymph nodes. Reproductive: Uterus and bilateral adnexa are unremarkable. Other: No free fluid. No abdominopelvic fluid collection. No pneumoperitoneum. No abdominal wall hernia. Musculoskeletal: No acute or significant osseous findings. IMPRESSION: 1. No acute abdominopelvic findings. Normal appendix. 2. Multiple scattered foci of ground-glass opacity within the right lower lobe suggesting an infectious or inflammatory process. 3. Hepatic steatosis. Electronically Signed   By: NDavina PokeD.O.   On: 12/21/2020 16:58    Procedures Procedures   Medications Ordered in ED Medications  oxyCODONE-acetaminophen (PERCOCET/ROXICET) 5-325 MG per tablet 1 tablet (has no administration in time range)  ketorolac (TORADOL) 30 MG/ML injection 15 mg (15 mg Intravenous Given 12/21/20 1554)  iohexol (OMNIPAQUE) 350 MG/ML injection 80 mL (80 mLs Intravenous Contrast Given 12/21/20 1637)    ED Course  I have reviewed the triage vital signs and the nursing notes.  Pertinent labs & imaging results that were available during my care of the patient were reviewed by me and considered in my medical decision making (see chart for details).    MDM Rules/Calculators/A&P                           Patient comes  in with chief complaint of bilateral flank pain, right worse than left.  Her initial work-up is reassuring.  UA reveals contaminated sample, that has bacteriuria and pyuria.  Difficult to interpret her UA in the setting of contamination.  Clinically, she is not having any classic symptoms of UTI but reports that in the past  her UTI has presented simply with back pain like this.  CT scan was completed to ensure there is no ovarian torsion, hemorrhagic cyst, large fibroid and also to ensure there is no kidney stone -it is negative.  The CT scan is showing right-sided pleural consolidation, I suspect this is just a sequela of her flu.  Patient denies any worsening cough, fevers, pleuritic chest pain.  I do not think she has pneumonia.  She will be discharged with Naprosyn and Tylenol.  Keflex has been prescribed with wait and watch approach. Zofran has been prescribed for her nausea.  The patient appears reasonably screened and/or stabilized for discharge and I doubt any other medical condition or other [facility] requiring further screening, evaluation, or treatment in the ED at this time prior to discharge.   Results from the ER workup discussed with the patient face to face and all questions answered to the best of my ability. The patient is safe for discharge with strict return precautions.   Final Clinical Impression(s) / ED Diagnoses Final diagnoses:  Bilateral flank pain    Rx / DC Orders ED Discharge Orders          Ordered    naproxen (NAPROSYN) 500 MG tablet  2 times daily        12/21/20 1719    acetaminophen (TYLENOL) 500 MG tablet  Every 6 hours PRN        12/21/20 [place], Aaylah Pokorny, MD 12/21/20 1730

## 2020-12-21 NOTE — ED Notes (Addendum)
An After Visit Summary was printed and given to the patient. Discharge instructions given and no further questions at this time.  Pt states her boyfriend is driving her home.

## 2020-12-21 NOTE — Patient Instructions (Signed)
Katelyn Aguilar, thank you for joining Mar Daring, PA-C for today's virtual visit.  While this provider is not your primary care provider (PCP), if your PCP is located in our provider database this encounter information will be shared with them immediately following your visit.  Consent: (Patient) Katelyn Aguilar provided verbal consent for this virtual visit at the beginning of the encounter.  Current Medications:  Current Outpatient Medications:    albuterol (VENTOLIN HFA) 108 (90 Base) MCG/ACT inhaler, Inhale 1-2 puffs into the lungs every 6 (six) hours as needed for wheezing or shortness of breath., Disp: , Rfl:    ARIPiprazole (ABILIFY) 5 MG tablet, Take 1 tablet (5 mg total) by mouth daily., Disp: 90 tablet, Rfl: 0   benzonatate (TESSALON) 100 MG capsule, Take 1 capsule (100 mg total) by mouth every 8 (eight) hours., Disp: 21 capsule, Rfl: 0   Blood Pressure KIT, Check blood pressure 2-3 times week, Disp: 1 kit, Rfl: 0   calcium carbonate (TUMS - DOSED IN MG ELEMENTAL CALCIUM) 500 MG chewable tablet, Chew 1 tablet by mouth daily as needed for indigestion or heartburn., Disp: , Rfl:    cetirizine (ZYRTEC) 10 MG tablet, Take 1 tablet (10 mg total) by mouth daily., Disp: 30 tablet, Rfl: 11   escitalopram (LEXAPRO) 10 MG tablet, TAKE 1 TABLET(10 MG) BY MOUTH DAILY, Disp: 30 tablet, Rfl: 0   hydrOXYzine (ATARAX/VISTARIL) 10 MG tablet, Take 1 tablet (10 mg total) by mouth 2 (two) times daily as needed. May increase to three times a day after 1 week if needed, Disp: 90 tablet, Rfl: 0   ibuprofen (ADVIL) 600 MG tablet, Take 600 mg by mouth every 6 (six) hours as needed for headache or moderate pain., Disp: , Rfl:    metoCLOPramide (REGLAN) 10 MG tablet, Take 1 tablet (10 mg total) by mouth every 8 (eight) hours as needed for nausea., Disp: 10 tablet, Rfl: 0   oseltamivir (TAMIFLU) 75 MG capsule, Take 1 capsule (75 mg total) by mouth every 12 (twelve) hours., Disp: 10 capsule, Rfl: 0    pantoprazole (PROTONIX) 40 MG tablet, Take 1 tablet (40 mg total) by mouth 2 (two) times daily., Disp: 60 tablet, Rfl: 3   scopolamine (TRANSDERM-SCOP) 1 MG/3DAYS, Place 1 patch (1.5 mg total) onto the skin every 3 (three) days., Disp: 4 patch, Rfl: 0   Medications ordered in this encounter:  No orders of the defined types were placed in this encounter.    *If you need refills on other medications prior to your next appointment, please contact your pharmacy*  Follow-Up: Call back or seek an in-person evaluation if the symptoms worsen or if the condition fails to improve as anticipated.  Other Instructions Based on what you shared with me, I feel your condition warrants further evaluation as soon as possible at an Emergency department.      If you are having a true medical emergency please call 911.      Emergency [facility]  Get Driving Directions  [phone]  [address]  [place], [place] [address]  Open 24/7/365      [facility] Emergency Department at [place]  [address]  [place], [place] [address]  Open 24/7/365    Emergency [facility]  Get Driving Directions  [phone]  [address], [place] [address]  Open 24/7/365      [facility] Emergency Department at [facility]  [facility]  Get Driving Directions  [phone]  [place], [place] [address]  Open 24/7/365    [facility]  Emergency [place]  Get Driving Directions  [phone]  [place], [place] [address]  Open 24/7/365    [facility]  Emergency Department- [facility] [place]  Get Driving Directions  [address]  [place], [place] [address]  Open 24/7/365    [facility]  Emergency [facility]  Get Driving Directions  [phone]  [address]  [place], [place] [address]  Open 24/7/365      If you have been instructed to have an in-person evaluation today at a local Urgent Care facility, please use the link below. It will take you to a list of all of our available [facility], including address, phone number and hours of operation. Please do not delay care.  [facility]  If you or a family member do not have a primary care provider, use the link below to schedule a visit and establish care. When you choose a [facility] primary care physician or advanced practice provider, you gain a long-term partner in health. Find a Primary Care Provider  Learn more about [facility]'s in-office and virtual care options: [facility]

## 2020-12-21 NOTE — Progress Notes (Deleted)
    SUBJECTIVE:   CHIEF COMPLAINT / HPI:   Concern for UTI: 20 year old female presenting with concern for urinary tract infection.  Endorses***.  Denies***.  PERTINENT  PMH / PSH: None relevant  OBJECTIVE:   LMP  (LMP Unknown)  ***  General: NAD, pleasant, able to participate in exam Cardiac: RRR, no murmurs. Respiratory: CTAB, normal effort, No wheezes, rales or rhonchi Abdomen: Bowel sounds present, nontender, nondistended, no hepatosplenomegaly. Extremities: no edema or cyanosis. Skin: warm and dry, no rashes noted Neuro: alert, no obvious focal deficits Psych: Normal affect and mood  ASSESSMENT/PLAN:   No problem-specific Assessment & Plan notes found for this encounter.     Jackelyn Poling, DO [facility]    {    This will disappear when note is signed, click to select method of visit    :1}

## 2020-12-23 ENCOUNTER — Telehealth: Payer: Self-pay

## 2020-12-23 NOTE — Telephone Encounter (Signed)
Transition Care Management Follow-up Telephone Call Date of discharge and from where: 12/21/2020-[facility]  How have you been since you were released from the hospital? Patient stated she is still having some pain. She is taking her pain medications but will contact GI on Monday.  Any questions or concerns? No  Items Reviewed: Did the pt receive and understand the discharge instructions provided? Yes  Medications obtained and verified? Yes  Other? No  Any new allergies since your discharge? No  Dietary orders reviewed? No Do you have support at home? Yes   Home Care and Equipment/Supplies: Were home health services ordered? not applicable If so, what is the name of the agency? N/A  Has the agency set up a time to come to the patient's home? not applicable Were any new equipment or medical supplies ordered?  No What is the name of the medical supply agency? N/A Were you able to get the supplies/equipment? not applicable Do you have any questions related to the use of the equipment or supplies? No  Functional Questionnaire: (I = Independent and D = Dependent) ADLs: I  Bathing/Dressing- I  Meal Prep- I  Eating- I  Maintaining continence- I  Transferring/Ambulation- I  Managing Meds- I  Follow up appointments reviewed:  PCP Hospital f/u appt confirmed? No   Specialist Hospital f/u appt confirmed? No Are transportation arrangements needed? No  If their condition worsens, is the pt aware to call PCP or go to the Emergency Dept.? Yes Was the patient provided with contact information for the PCP's office or ED? Yes Was to pt encouraged to call back with questions or concerns? Yes

## 2020-12-26 ENCOUNTER — Encounter: Payer: Self-pay | Admitting: Emergency Medicine

## 2020-12-26 ENCOUNTER — Other Ambulatory Visit: Payer: Self-pay

## 2020-12-26 ENCOUNTER — Ambulatory Visit
Admission: EM | Admit: 2020-12-26 | Discharge: 2020-12-26 | Disposition: A | Discharge status: Home / Self Care | Payer: Medicaid Other | Attending: Internal Medicine | Admitting: Internal Medicine

## 2020-12-26 ENCOUNTER — Telehealth: Payer: Self-pay | Admitting: *Deleted

## 2020-12-26 DIAGNOSIS — M545 Low back pain, unspecified: Secondary | ICD-10-CM

## 2020-12-26 DIAGNOSIS — R102 Pelvic and perineal pain: Secondary | ICD-10-CM | POA: Diagnosis not present

## 2020-12-26 LAB — POCT URINALYSIS DIP (MANUAL ENTRY)
Bilirubin, UA: NEGATIVE
Blood, UA: NEGATIVE
Glucose, UA: NEGATIVE mg/dL
Nitrite, UA: NEGATIVE
Protein Ur, POC: 30 mg/dL — AB
Spec Grav, UA: 1.025 (ref 1.010–1.025)
Urobilinogen, UA: 0.2 E.U./dL
pH, UA: 6 (ref 5.0–8.0)

## 2020-12-26 LAB — POCT URINE PREGNANCY: Preg Test, Ur: NEGATIVE

## 2020-12-26 MED ORDER — SULFAMETHOXAZOLE-TRIMETHOPRIM 800-160 MG PO TABS: 1.0000 | ORAL_TABLET | Freq: Two times a day (BID) | ORAL | 0 refills | Status: AC

## 2020-12-26 MED ORDER — CEFTRIAXONE SODIUM 1 G IJ SOLR
0.5000 g | Freq: Once | INTRAMUSCULAR | Status: AC
  Administered 2020-12-26: 16:00:00 0.5 g via INTRAMUSCULAR

## 2020-12-26 NOTE — Progress Notes (Deleted)
    SUBJECTIVE:   CHIEF COMPLAINT / HPI: UTI symptoms  ***  PERTINENT  PMH / PSH: ***  OBJECTIVE:   LMP  (LMP Unknown)   ***  ASSESSMENT/PLAN:   No problem-specific Assessment & Plan notes found for this encounter.     Ronnald Ramp, MD [facility]

## 2020-12-26 NOTE — Telephone Encounter (Signed)
Patient made an appt for tomorrow to have her urine re-evaluated but she was in the ED last week for UTI symptoms.  Patient would like to discuss results and the treatment plan she was given as she isn't experiencing any relief.  She is also unsure if she will even be able to make tomorrows appointment.  Will forward to MD to call patient about this concern.  Thanks. Jmh

## 2020-12-26 NOTE — ED Triage Notes (Signed)
Recently seen for UTI symptoms, has been taking antibiotics and pain killers without relief. Reports a lot of bilateral flank pain, worsening since starting naproxen, keflex, and tylenol

## 2020-12-26 NOTE — Discharge Instructions (Signed)
Urine culture and vaginal swab are pending.  We will call if they are positive.  You were given Rocephin injection in urgent care today.  You are also prescribed Bactrim antibiotic to take.  Please go to the hospital if symptoms worsen or do not improve in the next 48 hours.

## 2020-12-26 NOTE — ED Provider Notes (Signed)
[facility]    CSN: [id number] Arrival date & time: 12/26/20  1327      History   Chief Complaint Chief Complaint  Patient presents with   Dysuria    HPI Katelyn Aguilar is a 20 y.o. female.   Patient presents with bilateral lower back pain, flank pain, suprapubic pain that started a little over a week ago.  Patient denies urinary burning, urinary frequency, vaginal discharge, hematuria, fever but reports that her urinary tract infections typically present as this.  Denies any known exposure to STD.  Denies any chance of pregnancy.  Patient was prescribed Keflex, naproxen, Tylenol at ED visit when symptoms first started with no improvement in symptoms.  Patient reports that symptoms have appeared to worsen.   Dysuria  Past Medical History:  Diagnosis Date   Allergic rhinitis    Anxiety    Anxiety 11/17/2018   Dysrhythmia    tachycardia   GERD (gastroesophageal reflux disease)    Nausea and vomiting 04/06/2019   Obesity    Pilonidal abscess 12/04/2019   Pilonidal cyst without abscess 04/18/2020   Pyelonephritis 09/15/2019    Patient Active Problem List   Diagnosis Date Noted   Cervical cancer screening 11/29/2020   Elevated blood pressure reading 11/29/2020   Left otitis media 10/27/2020   Severe episode of recurrent major depressive disorder, without psychotic features ([place]) 10/04/2020   GAD (generalized anxiety disorder) 10/04/2020   Ingrown toenail of both feet 08/19/2020   Pain of left breast 06/26/2020   BMI 60.0-69.9, adult ([place]) 05/19/2020   Contraception management 05/19/2020   Anxiety with depression 05/09/2020   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/26/2020   Tachycardia 02/28/2020   Fatigue 02/28/2020   Screen for STD (sexually transmitted disease) 01/12/2020   Irregular menses 12/04/2019   Dizziness 04/22/2019   Cramp in limb 04/10/2019   Simple tics 11/17/2018   Involuntary movements 10/21/2018   Encounter for birth control 10/21/2018     Past Surgical History:  Procedure Laterality Date   BIOPSY  04/19/2020   Procedure: BIOPSY;  Surgeon: Thornton Park, MD;  Location: [facility];  Service: Gastroenterology;;   ESOPHAGOGASTRODUODENOSCOPY     ESOPHAGOGASTRODUODENOSCOPY (EGD) WITH PROPOFOL N/A 04/19/2020   Procedure: ESOPHAGOGASTRODUODENOSCOPY (EGD) WITH PROPOFOL;  Surgeon: Thornton Park, MD;  Location: [facility];  Service: Gastroenterology;  Laterality: N/A;   INCISION AND DRAINAGE ABSCESS N/A 04/26/2020   Procedure: excision of pilonidal cyst with open packing;  Surgeon: Armandina Gemma, MD;  Location: [facility];  Service: General;  Laterality: N/A;   NO PAST SURGERIES      OB History   No obstetric history on file.      Home Medications    Prior to Admission medications   Medication Sig Start Date End Date Taking? Authorizing Provider  sulfamethoxazole-trimethoprim (BACTRIM DS) 800-160 MG tablet Take 1 tablet by mouth 2 (two) times daily for 5 days. 12/26/20 12/31/20 Yes , Michele Rockers, FNP  acetaminophen (TYLENOL) 500 MG tablet Take 1 tablet (500 mg total) by mouth every 6 (six) hours as needed. 12/21/20   Varney Biles, MD  albuterol (VENTOLIN HFA) 108 (90 Base) MCG/ACT inhaler Inhale 1-2 puffs into the lungs every 6 (six) hours as needed for wheezing or shortness of breath.    [provider]  ARIPiprazole (ABILIFY) 5 MG tablet Take 1 tablet (5 mg total) by mouth daily. 10/06/20   Ezequiel Essex, MD  benzonatate (TESSALON) 100 MG capsule Take 1 capsule (100 mg total) by mouth every  8 (eight) hours. 12/18/20   Petrucelli, Glynda Jaeger, PA-C  Blood Pressure KIT Check blood pressure 2-3 times week 11/28/20   Carollee Leitz, MD  calcium carbonate (TUMS - DOSED IN MG ELEMENTAL CALCIUM) 500 MG chewable tablet Chew 1 tablet by mouth daily as needed for indigestion or heartburn.    [provider]  cephALEXin (KEFLEX) 500 MG capsule Take 1 capsule (500 mg total) by mouth 2 (two) times daily. 12/21/20    Varney Biles, MD  cetirizine (ZYRTEC) 10 MG tablet Take 1 tablet (10 mg total) by mouth daily. 11/10/19   Carollee Leitz, MD  escitalopram (LEXAPRO) 10 MG tablet TAKE 1 TABLET(10 MG) BY MOUTH DAILY 10/25/20   Carollee Leitz, MD  hydrOXYzine (ATARAX/VISTARIL) 10 MG tablet Take 1 tablet (10 mg total) by mouth 2 (two) times daily as needed. May increase to three times a day after 1 week if needed 05/17/20   Carollee Leitz, MD  ibuprofen (ADVIL) 600 MG tablet Take 600 mg by mouth every 6 (six) hours as needed for headache or moderate pain.    [provider]  metoCLOPramide (REGLAN) 10 MG tablet Take 1 tablet (10 mg total) by mouth every 8 (eight) hours as needed for nausea. 12/18/20   Petrucelli, Samantha R, PA-C  naproxen (NAPROSYN) 500 MG tablet Take 1 tablet (500 mg total) by mouth 2 (two) times daily. 12/21/20   Varney Biles, MD  ondansetron (ZOFRAN) 4 MG tablet Take 1 tablet (4 mg total) by mouth every 6 (six) hours. 12/21/20   Varney Biles, MD  oseltamivir (TAMIFLU) 75 MG capsule Take 1 capsule (75 mg total) by mouth every 12 (twelve) hours. 12/18/20   Petrucelli, Samantha R, PA-C  pantoprazole (PROTONIX) 40 MG tablet Take 1 tablet (40 mg total) by mouth 2 (two) times daily. 03/09/20   Thornton Park, MD  scopolamine (TRANSDERM-SCOP) 1 MG/3DAYS Place 1 patch (1.5 mg total) onto the skin every 3 (three) days. 10/06/20   Ezequiel Essex, MD    Family History Family History  Problem Relation Age of Onset   COPD Mother    Depression Mother    Cancer Mother        blood cancer   Diabetes Other    Colon cancer Neg Hx    Esophageal cancer Neg Hx    Pancreatic cancer Neg Hx    Stomach cancer Neg Hx     Social History Social History   Tobacco Use   Smoking status: Never    Passive exposure: Yes   Smokeless tobacco: Never  Vaping Use   Vaping Use: Former  Substance Use Topics   Alcohol use: Yes    Comment: occasional   Drug use: No     Allergies   Patient has no known  allergies.   Review of Systems Review of Systems Per HPI  Physical Exam Triage Vital Signs ED Triage Vitals  Enc Vitals Group     BP 12/26/20 1523 (!) 152/88     Pulse Rate 12/26/20 1523 86     Resp 12/26/20 1523 16     Temp 12/26/20 1523 97.7 F (36.5 C)     Temp Source 12/26/20 1523 Oral     SpO2 12/26/20 1523 96 %     Weight --      Height --      Head Circumference --      Peak Flow --      Pain Score 12/26/20 1524 5     Pain Loc --  Pain Edu? --      Excl. in [place]? --    No data found.  Updated Vital Signs BP (!) 152/88 (BP Location: Left Arm)   Pulse 86   Temp 97.7 F (36.5 C) (Oral)   Resp 16   LMP  (LMP Unknown)   SpO2 96%   Visual Acuity Right Eye Distance:   Left Eye Distance:   Bilateral Distance:    Right Eye Near:   Left Eye Near:    Bilateral Near:     Physical Exam Constitutional:      General: She is not in acute distress.    Appearance: Normal appearance. She is not toxic-appearing or diaphoretic.  HENT:     Head: Normocephalic and atraumatic.  Eyes:     Extraocular Movements: Extraocular movements intact.     Conjunctiva/sclera: Conjunctivae normal.  Cardiovascular:     Rate and Rhythm: Normal rate and regular rhythm.     Pulses: Normal pulses.     Heart sounds: Normal heart sounds.  Pulmonary:     Effort: Pulmonary effort is normal. No respiratory distress.     Breath sounds: Normal breath sounds.  Abdominal:     General: Bowel sounds are normal. There is no distension.     Palpations: Abdomen is soft.     Tenderness: There is no abdominal tenderness.  Neurological:     General: No focal deficit present.     Mental Status: She is alert and oriented to person, place, and time. Mental status is at baseline.  Psychiatric:        Mood and Affect: Mood normal.        Behavior: Behavior normal.        Thought Content: Thought content normal.        Judgment: Judgment normal.     UC Treatments / Results  Labs (all labs  ordered are listed, but only abnormal results are displayed) Labs Reviewed  POCT URINALYSIS DIP (MANUAL ENTRY) - Abnormal; Notable for the following components:      Result Value   Ketones, POC UA trace (5) (*)    Protein Ur, POC =30 (*)    Leukocytes, UA Small (1+) (*)    All other components within normal limits  URINE CULTURE  POCT URINE PREGNANCY  CERVICOVAGINAL ANCILLARY ONLY    EKG   Radiology No results found.  Procedures Procedures (including critical care time)  Medications Ordered in UC Medications  cefTRIAXone (ROCEPHIN) injection 0.5 g (0.5 g Intramuscular Given 12/26/20 1629)    Initial Impression / Assessment and Plan / UC Course  I have reviewed the triage vital signs and the nursing notes.  Pertinent labs & imaging results that were available during my care of the patient were reviewed by me and considered in my medical decision making (see chart for details).     Urinalysis showing small amount of leukocytes that can indicate urinary tract infection.  Urine culture is pending.  Vaginal swab is pending to rule out other etiologies of symptoms.  Rocephin IM administered in urgent care today given that patient usually has severe urinary tract infections and has to receive IM injection for treatment.  Patient to stop Keflex and start Bactrim.  Advised patient to go to the hospital if no improvement in symptoms in the next 48 hours.  No red flags on exam currently.  Discharged strict return precautions.  Patient verbalized understanding and was agreeable with plan. Final Clinical Impressions(s) / UC Diagnoses  Final diagnoses:  Suprapubic pain  Acute bilateral low back pain without sciatica     Discharge Instructions      Urine culture and vaginal swab are pending.  We will call if they are positive.  You were given Rocephin injection in urgent care today.  You are also prescribed Bactrim antibiotic to take.  Please go to the hospital if symptoms worsen or  do not improve in the next 48 hours.    ED Prescriptions     Medication Sig Dispense Auth. Provider   sulfamethoxazole-trimethoprim (BACTRIM DS) 800-160 MG tablet Take 1 tablet by mouth 2 (two) times daily for 5 days. 10 tablet Teodora Medici, [place]      PDMP not reviewed this encounter.   Teodora Medici, [place] 12/26/20 [phone]

## 2020-12-27 ENCOUNTER — Ambulatory Visit: Payer: Medicaid Other

## 2020-12-27 ENCOUNTER — Telehealth (HOSPITAL_COMMUNITY): Payer: Self-pay | Admitting: Emergency Medicine

## 2020-12-27 LAB — CERVICOVAGINAL ANCILLARY ONLY
Bacterial Vaginitis (gardnerella): POSITIVE — AB
Candida Glabrata: NEGATIVE
Candida Vaginitis: NEGATIVE
Comment: NEGATIVE
Comment: NEGATIVE
Comment: NEGATIVE

## 2020-12-27 LAB — URINE CULTURE

## 2020-12-27 MED ORDER — METRONIDAZOLE 500 MG PO TABS: 500.0000 mg | ORAL_TABLET | Freq: Two times a day (BID) | ORAL | 0 refills | Status: DC

## 2020-12-27 NOTE — Telephone Encounter (Signed)
Will need to schedule virtual visit if unable to make tomorrow's appointment.  Thanks Dana Allan, MD Family Medicine Residency

## 2020-12-28 ENCOUNTER — Encounter: Payer: Self-pay | Admitting: *Deleted

## 2020-12-28 NOTE — Telephone Encounter (Signed)
Mychart message sent to patient since she didn't keep the appt.  Quentin Shorey,CMA

## 2020-12-29 DIAGNOSIS — Z419 Encounter for procedure for purposes other than remedying health state, unspecified: Secondary | ICD-10-CM | POA: Diagnosis not present

## 2021-01-04 ENCOUNTER — Telehealth: Payer: Self-pay

## 2021-01-04 NOTE — Telephone Encounter (Signed)
Called pt to inform about scheduled 6 mo f/u appt. Phone rang busy.

## 2021-01-04 NOTE — Telephone Encounter (Signed)
-----   Message from Deon Pilling, LPN sent at 39/07/6732  9:05 AM EST ----- Regarding: RE: 6 month follow up Appt reminder mailed. Will need to call pt as well ----- Message ----- From: Deon Pilling, LPN Sent: 19/03/7900   8:45 AM EST To: Deon Pilling, LPN Subject: RE: 6 month follow up                          Appointment Information Name: Zamoria, Boss MRN: [id number] Date: 03/21/2021 Status: Sch Time: 11:10 AM Length: 20 Visit Type: FOLLOW UP 20 [336] Copay: $0.00 Provider: Tressia Danas, MD Department: [facility] Referring Provider: Dana Allan CSN: [id number] Notes: 6 mo f/u gastroparesis/ae Made On: 01/04/2021 8:44 AM By: Peterson Ao Chonda Baney J  Call pt ----- Message ----- From: Alexis Frock, CMA Sent: 01/04/2021   8:00 AM EST To: Deon Pilling, LPN Subject: 6 month follow up                              6 month follow up needed.  Gastroparesis  See notes from 09-09-2020

## 2021-01-05 NOTE — Telephone Encounter (Signed)
SECOND ATTEMPT:  Called pt to inform about scheduled appt. Phone rings x2, then beeps repeatedly.

## 2021-01-06 NOTE — Telephone Encounter (Signed)
FINAL ATTEMPT:   Called pt to inform about scheduled appt. Phone rings x2, then beeps repeatedly.

## 2021-01-27 ENCOUNTER — Other Ambulatory Visit: Payer: Self-pay | Admitting: Family Medicine

## 2021-01-29 DIAGNOSIS — Z419 Encounter for procedure for purposes other than remedying health state, unspecified: Secondary | ICD-10-CM | POA: Diagnosis not present

## 2021-02-14 ENCOUNTER — Other Ambulatory Visit: Payer: Self-pay

## 2021-02-14 ENCOUNTER — Ambulatory Visit (INDEPENDENT_AMBULATORY_CARE_PROVIDER_SITE_OTHER): Payer: Medicaid Other | Admitting: Clinical

## 2021-02-14 DIAGNOSIS — F331 Major depressive disorder, recurrent, moderate: Secondary | ICD-10-CM

## 2021-02-15 NOTE — Plan of Care (Signed)
Client reported her continued therapy goals are to "bring herself out of depression and anxiety episodes".  Client was in agreement with the treatment plan.

## 2021-02-15 NOTE — Progress Notes (Signed)
THERAPIST PROGRESS NOTE  Session Time: 40 minutes  Participation Level: Active  Behavioral Response: CasualAlertEuthymic  Type of Therapy: Individual Therapy  Treatment Goals addressed: Coping  Interventions: CBT and Supportive  Summary:  Katelyn Aguilar is a 21 y.o. female who presents for the scheduled session oriented times five, appropriately dressed, and friendly. Client denied hallucinations and delusions.client presents for initial appointment with this therapist. Client reported she would like to reestablish for therapy.  Client reported she has not yet been seen by psychiatrist but feels very strongly about being evaluated for possibly having bipolar disorder.  Client reported she has immediate family history of bipolar disorder, ADHD/ADD and depression.  Client reported she has a history of mood swings, depressed mood, irritability, difficulty regulating her emotions, feeling on edge, excessive worry, feeling overwhelmed, no swelling, insomnia and oversleeping. Client reported difficulty with sporadic panic attacks and crying spells.  Client reported her other medical doctors have prescribed hydroxyzine, Lexapro, and Abilify.  Client reported she discontinued the Abilify and Lexapro but does continue to take the hydroxyzine as needed to help with panic attacks.  Client reported her psychiatric symptoms have been present since a young age but she has not received a proper diagnosis.  Client reported she also experiences tic movements on different parts of her body.  Client reported she has sought medical attention which doctors found her screenings to be normal.  Client reported her other doctors have stated they believe her tic movements are related to anxiety.  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  Client reported substance use history includes taking delta 8  products to help with her symptoms.  Client reported history of self harming in high school and has not done it since.  Client reported she currently lives with her mother, stepfather, brother, and fianc.  Client reported one of her primary stressors is coping with her mom's cancer diagnosis.  Client reported no history of hospitalization regarding mental health concerns.  GAD 7 : Generalized Anxiety Score 02/14/2021 11/16/2020 10/20/2020 10/04/2020  Nervous, Anxious, on Edge 3 3 3 3   Control/stop worrying 3 3 3 3   Worry too much - different things 3 3 3 3   Trouble relaxing 2 1 3 3   Restless 3 1 1 3   Easily annoyed or irritable 3 3 3 3   Afraid - awful might happen 3 1 3 3   Total GAD 7 Score 20 15 19 21   Anxiety Difficulty Somewhat difficult Somewhat difficult Somewhat difficult Somewhat difficult     Flowsheet Row Counselor from 02/14/2021 in [facility]  PHQ-9 Total Score 13         Suicidal/Homicidal: Nowithout intent/plan  Therapist Response:  Therapist began the appointment making introduction and discussing continued confidentiality causes. Therapist used CBT to engage the client to ask her how she has been coping with her symptoms since she was last in therapy. Therapist used CBT to ask the client open-ended questions about her mental health history. Therapist used CBT to engage with client and ask her about medication compliance compared to ongoing symptoms and/or side effects. Therapist used CBT to engage the client to complete S DOH. Therapist used CBT to ask client about continued goals she would like to work on in her therapy sessions. Therapist completed client's treatment plan with her input. Therapist addressed questions and concerns. Client was scheduled for follow-up appointments.  Plan: Return for outpatient therapy.  Therapist informed the client of her working hours to be seen by psychiatrist.  Therapist informed the client if she is  unable to walk in to schedule an appointment to be seen by psychiatrist at the next availability.  Diagnosis: Major depressive disorder, recurrent episode, moderate with anxious distress   Neena Rhymes Dorien Bessent, LCSW 02/14/2021

## 2021-02-16 ENCOUNTER — Telehealth: Payer: Self-pay

## 2021-02-16 NOTE — Telephone Encounter (Signed)
Patient calls nurse line reporting an ingrown toe nail. Patient reports she had removal back in August, however the nail is growing back "ingrown" again. Patient reports a lot of pain throughout the day as she is constantly on her feet.   Patient scheduled for next week for removal.

## 2021-02-21 ENCOUNTER — Ambulatory Visit (INDEPENDENT_AMBULATORY_CARE_PROVIDER_SITE_OTHER): Payer: Medicaid Other | Admitting: Family Medicine

## 2021-02-21 ENCOUNTER — Encounter: Payer: Self-pay | Admitting: Family Medicine

## 2021-02-21 ENCOUNTER — Other Ambulatory Visit: Payer: Self-pay

## 2021-02-21 VITALS — BP 130/90 | HR 74 | Wt 332.0 lb

## 2021-02-21 DIAGNOSIS — L600 Ingrowing nail: Secondary | ICD-10-CM

## 2021-02-21 NOTE — Progress Notes (Signed)
° ° °  SUBJECTIVE:   CHIEF COMPLAINT / HPI:  Chief Complaint  Patient presents with   Ingrown Toenail    Patient reports ingrown toenail of the right foot.  She had a toenail avulsion done several months ago in August 2022.  She states that the ingrown toenail has grown back on the opposite side.  She has noticed more pain recently as she has been walking more.  She wants to have the entire toenail removed due to concern that it may grow back.  PERTINENT  PMH / PSH: Ingrown toenail   Patient Care Team: Dana Allan, MD as PCP - General (Family Medicine) Leveda Anna Santiago Bumpers, MD as Consulting Physician (Family Medicine)   OBJECTIVE:   BP 130/90    Pulse 74    Wt (!) 332 lb (150.6 kg)    BMI 58.81 kg/m   Physical Exam Constitutional:      General: She is not in acute distress.    Appearance: She is obese.  Cardiovascular:     Rate and Rhythm: Normal rate.  Pulmonary:     Effort: Pulmonary effort is normal. No respiratory distress.  Musculoskeletal:     Cervical back: Neck supple.  Neurological:     Mental Status: She is alert.  Derm: Right great toe with ingrown toenail worse on the lateral aspect which is tender to palpation.  There is dried blood without surrounding erythema.     Depression screen [facility] 2/9 02/21/2021  Decreased Interest 1  Down, Depressed, Hopeless 0  PHQ - 2 Score 1  Altered sleeping 1  Tired, decreased energy 1  Change in appetite 1  Feeling bad or failure about yourself  0  Trouble concentrating 2  Moving slowly or fidgety/restless 1  Suicidal thoughts 0  PHQ-9 Score 7  Difficult doing work/chores Somewhat difficult  Some encounter information is confidential and restricted. Go to Review Flowsheets activity to see all data.  Some recent data might be hidden     {Show previous vital signs (optional):23777}    ASSESSMENT/PLAN:   Ingrown toenail of right great toe Issue persist despite previous toenail avulsion.  Does not appear infected.   Referred to podiatry as she request the entire nail to be removed.  Return if symptoms worsen or fail to improve.   Littie Deeds, MD [facility]

## 2021-02-21 NOTE — Patient Instructions (Addendum)
It was nice seeing you today!  They will give you a call for an appointment.  I recommend doing warm water soaks twice a day in the meantime.  Make sure to follow up with your PCP for your other issues.  Stay well, Katelyn Deeds, MD [facility] [phone]  --  Make sure to check out at the front desk before you leave today.  Please arrive at least 15 minutes prior to your scheduled appointments.  If you had blood work today, I will send you a MyChart message or a letter if results are normal. Otherwise, I will give you a call.  If you had a referral placed, they will call you to set up an appointment. Please give [place] a call if you don't hear back in the next 2 weeks.  If you need additional refills before your next appointment, please call your pharmacy first.

## 2021-02-23 ENCOUNTER — Encounter: Payer: Self-pay | Admitting: Emergency Medicine

## 2021-02-23 ENCOUNTER — Other Ambulatory Visit: Payer: Self-pay

## 2021-02-23 ENCOUNTER — Ambulatory Visit
Admission: EM | Admit: 2021-02-23 | Discharge: 2021-02-23 | Disposition: A | Discharge status: Home / Self Care | Payer: Medicaid Other | Attending: Nurse Practitioner | Admitting: Nurse Practitioner

## 2021-02-23 DIAGNOSIS — Z1152 Encounter for screening for COVID-19: Secondary | ICD-10-CM | POA: Diagnosis not present

## 2021-02-23 DIAGNOSIS — J329 Chronic sinusitis, unspecified: Secondary | ICD-10-CM | POA: Diagnosis not present

## 2021-02-23 DIAGNOSIS — R0981 Nasal congestion: Secondary | ICD-10-CM | POA: Diagnosis not present

## 2021-02-23 DIAGNOSIS — J019 Acute sinusitis, unspecified: Secondary | ICD-10-CM

## 2021-02-23 MED ORDER — PSEUDOEPHEDRINE HCL ER 120 MG PO TB12: 120.0000 mg | ORAL_TABLET | Freq: Two times a day (BID) | ORAL | 0 refills | Status: DC

## 2021-02-23 MED ORDER — FLUTICASONE PROPIONATE 50 MCG/ACT NA SUSP: 2.0000 | Freq: Every day | NASAL | 0 refills | Status: DC

## 2021-02-23 NOTE — ED Triage Notes (Signed)
Patient c/o head and nasal congestion x 1 day, sore throat started last night, some cough.  Patient has taken Ibuprofen, Naproxen and Nyquil.Marland Kitchen

## 2021-02-23 NOTE — ED Provider Notes (Addendum)
[facility]    CSN: [id number] Arrival date & time: 02/23/21  1102      History   Chief Complaint Chief Complaint  Patient presents with   Nasal Congestion    HPI Katelyn Aguilar is a 21 y.o. female.   Subjective:   Katelyn Aguilar is a 21 y.o. female who presents for evaluation of possible sinus infection. Symptoms include achiness, congestion, cough described as productive, nasal congestion, mild headache, sneezing, and sore throat with no fever, chills, night sweats or weight loss. Onset of symptoms was 1 day ago and has been rapidly worsening since that time. She has tried nyquil, ibuprofen and naproxen for her symptoms. She is drinking plenty of fluids.  She does not have a history of pneumonia or bronchitis. Patient is a non-smoker. Patient denies any sick contacts.   The following portions of the patient's history were reviewed and updated as appropriate: allergies, current medications, past family history, past medical history, past social history, past surgical history, and problem list.        Past Medical History:  Diagnosis Date   Allergic rhinitis    Anxiety    Anxiety 11/17/2018   Dysrhythmia    tachycardia   GERD (gastroesophageal reflux disease)    Nausea and vomiting 04/06/2019   Obesity    Pilonidal abscess 12/04/2019   Pilonidal cyst without abscess 04/18/2020   Pyelonephritis 09/15/2019    Patient Active Problem List   Diagnosis Date Noted   Cervical cancer screening 11/29/2020   Elevated blood pressure reading 11/29/2020   Left otitis media 10/27/2020   Severe episode of recurrent major depressive disorder, without psychotic features ([place]) 10/04/2020   GAD (generalized anxiety disorder) 10/04/2020   Ingrown toenail of both feet 08/19/2020   Pain of left breast 06/26/2020   BMI 60.0-69.9, adult ([place]) 05/19/2020   Contraception management 05/19/2020   Anxiety with depression 05/09/2020   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/26/2020    Tachycardia 02/28/2020   Fatigue 02/28/2020   Screen for STD (sexually transmitted disease) 01/12/2020   Irregular menses 12/04/2019   Dizziness 04/22/2019   Cramp in limb 04/10/2019   Simple tics 11/17/2018   Involuntary movements 10/21/2018   Encounter for birth control 10/21/2018    Past Surgical History:  Procedure Laterality Date   BIOPSY  04/19/2020   Procedure: BIOPSY;  Surgeon: Thornton Park, MD;  Location: [facility];  Service: Gastroenterology;;   ESOPHAGOGASTRODUODENOSCOPY     ESOPHAGOGASTRODUODENOSCOPY (EGD) WITH PROPOFOL N/A 04/19/2020   Procedure: ESOPHAGOGASTRODUODENOSCOPY (EGD) WITH PROPOFOL;  Surgeon: Thornton Park, MD;  Location: [facility];  Service: Gastroenterology;  Laterality: N/A;   INCISION AND DRAINAGE ABSCESS N/A 04/26/2020   Procedure: excision of pilonidal cyst with open packing;  Surgeon: Armandina Gemma, MD;  Location: [facility];  Service: General;  Laterality: N/A;   NO PAST SURGERIES      OB History   No obstetric history on file.      Home Medications    Prior to Admission medications   Medication Sig Start Date End Date Taking? Authorizing Provider  acetaminophen (TYLENOL) 500 MG tablet Take 1 tablet (500 mg total) by mouth every 6 (six) hours as needed. 12/21/20  Yes Varney Biles, MD  albuterol (VENTOLIN HFA) 108 (90 Base) MCG/ACT inhaler Inhale 1-2 puffs into the lungs every 6 (six) hours as needed for wheezing or shortness of breath.   Yes [provider]  ARIPiprazole (ABILIFY) 5 MG tablet Take 1 tablet (5 mg total)  by mouth daily. 10/06/20  Yes Ezequiel Essex, MD  Blood Pressure KIT Check blood pressure 2-3 times week 11/28/20  Yes Carollee Leitz, MD  calcium carbonate (TUMS - DOSED IN MG ELEMENTAL CALCIUM) 500 MG chewable tablet Chew 1 tablet by mouth daily as needed for indigestion or heartburn.   Yes [provider]  cetirizine (ZYRTEC) 10 MG tablet Take 1 tablet (10 mg total) by mouth daily. 11/10/19  Yes Carollee Leitz, MD  escitalopram (LEXAPRO) 10 MG tablet TAKE 1 TABLET(10 MG) BY MOUTH DAILY 10/25/20  Yes Carollee Leitz, MD  fluticasone [facility]) 50 MCG/ACT nasal spray Place 2 sprays into both nostrils daily. 02/23/21  Yes Enrique Sack, FNP  hydrOXYzine (ATARAX) 10 MG tablet TAKE 1 TABLET(10 MG) BY MOUTH TWICE DAILY AS NEEDED. MAY INCREASE TO 3 TIMES A DAY AFTER 1 WEEK AS NEEDED 01/30/21  Yes Carollee Leitz, MD  ibuprofen (ADVIL) 600 MG tablet Take 600 mg by mouth every 6 (six) hours as needed for headache or moderate pain.   Yes [provider]  metoCLOPramide (REGLAN) 10 MG tablet Take 1 tablet (10 mg total) by mouth every 8 (eight) hours as needed for nausea. 12/18/20  Yes Petrucelli, Therma Lasure R, PA-C  naproxen (NAPROSYN) 500 MG tablet Take 1 tablet (500 mg total) by mouth 2 (two) times daily. 12/21/20  Yes Nanavati, Ankit, MD  ondansetron (ZOFRAN) 4 MG tablet Take 1 tablet (4 mg total) by mouth every 6 (six) hours. 12/21/20  Yes Varney Biles, MD  pantoprazole (PROTONIX) 40 MG tablet Take 1 tablet (40 mg total) by mouth 2 (two) times daily. 03/09/20  Yes Thornton Park, MD  pseudoephedrine (SUDAFED 12 HOUR) 120 MG 12 hr tablet Take 1 tablet (120 mg total) by mouth 2 (two) times daily. 02/23/21  Yes Enrique Sack, FNP  scopolamine (TRANSDERM-SCOP) 1 MG/3DAYS Place 1 patch (1.5 mg total) onto the skin every 3 (three) days. 10/06/20  Yes Ezequiel Essex, MD    Family History Family History  Problem Relation Age of Onset   COPD Mother    Depression Mother    Cancer Mother        blood cancer   Diabetes Other    Colon cancer Neg Hx    Esophageal cancer Neg Hx    Pancreatic cancer Neg Hx    Stomach cancer Neg Hx     Social History Social History   Tobacco Use   Smoking status: Never    Passive exposure: Yes   Smokeless tobacco: Never  Vaping Use   Vaping Use: Former  Substance Use Topics   Alcohol use: Yes    Comment: occasional   Drug use: No     Allergies   Patient has  no known allergies.   Review of Systems Review of Systems  Constitutional:  Positive for fatigue. Negative for chills and fever.  HENT:  Positive for congestion, sneezing and sore throat. Negative for ear pain, facial swelling, rhinorrhea, sinus pressure and sinus pain.   Respiratory:  Positive for cough. Negative for shortness of breath and wheezing.   Gastrointestinal:  Negative for diarrhea, nausea and vomiting.  Musculoskeletal:  Negative for myalgias.  Neurological:  Positive for headaches.  All other systems reviewed and are negative.   Physical Exam Triage Vital Signs ED Triage Vitals [02/23/21 1344]  Enc Vitals Group     BP 122/83     Pulse Rate (!) 113     Resp      Temp 98.5 F (36.9 C)  Temp Source Oral     SpO2 96 %     Weight (!) 332 lb 0.2 oz (150.6 kg)     Height '5\' 3"'  (1.6 m)     Head Circumference      Peak Flow      Pain Score 2     Pain Loc      Pain Edu?      Excl. in [place]?    No data found.  Updated Vital Signs BP 122/83 (BP Location: Left Arm)    Pulse (!) 113    Temp 98.5 F (36.9 C) (Oral)    Ht '5\' 3"'  (1.6 m)    Wt (!) 332 lb 0.2 oz (150.6 kg)    SpO2 96%    BMI 58.81 kg/m   Visual Acuity Right Eye Distance:   Left Eye Distance:   Bilateral Distance:    Right Eye Near:   Left Eye Near:    Bilateral Near:     Physical Exam Vitals reviewed.  HENT:     Head: Normocephalic.     Nose: Congestion present.     Right Sinus: No maxillary sinus tenderness or frontal sinus tenderness.     Left Sinus: No maxillary sinus tenderness or frontal sinus tenderness.     Mouth/Throat:     Lips: Pink.     Mouth: Mucous membranes are moist.     Tongue: No lesions.     Pharynx: Oropharynx is clear. Uvula midline. No pharyngeal swelling, oropharyngeal exudate, posterior oropharyngeal erythema or uvula swelling.     Comments: Tongue piercing in situ  Eyes:     Conjunctiva/sclera: Conjunctivae normal.     Pupils: Pupils are equal, round, and reactive to  light.  Cardiovascular:     Rate and Rhythm: Normal rate.  Pulmonary:     Effort: Pulmonary effort is normal.  Abdominal:     Palpations: Abdomen is soft.  Musculoskeletal:        General: Normal range of motion.     Cervical back: Normal range of motion and neck supple.  Lymphadenopathy:     Cervical: No cervical adenopathy.  Skin:    General: Skin is warm and dry.  Neurological:     General: No focal deficit present.     Mental Status: She is alert and oriented to person, place, and time.  Psychiatric:        Mood and Affect: Mood normal.     UC Treatments / Results  Labs (all labs ordered are listed, but only abnormal results are displayed) Labs Reviewed  NOVEL CORONAVIRUS, NAA    EKG   Radiology No results found.  Procedures Procedures (including critical care time)  Medications Ordered in UC Medications - No data to display  Initial Impression / Assessment and Plan / UC Course  I have reviewed the triage vital signs and the nursing notes.  Pertinent labs & imaging results that were available during my care of the patient were reviewed by me and considered in my medical decision making (see chart for details).    21 yo female presents with achiness, congestion, productive cough, headache, sneezing and sore throat for the past 24 hours or so.  No fevers, chills or body aches.  She is treating supportively.  No sick contacts.  On exam, patient is alert and oriented.  Afebrile.  Nontoxic.  Supportive care prescribed and discussed.  COVID test pending.  Today's evaluation has revealed no signs of a dangerous process. Discussed diagnosis  with patient and/or guardian. Patient and/or guardian aware of their diagnosis, possible red flag symptoms to watch out for and need for close follow up. Patient and/or guardian understands verbal and written discharge instructions. Patient and/or guardian comfortable with plan and disposition.  Patient and/or guardian has a clear  mental status at this time, good insight into illness (after discussion and teaching) and has clear judgment to make decisions regarding their care  This care was provided during an unprecedented National Emergency due to the Novel Coronavirus (COVID-19) pandemic. COVID-19 infections and transmission risks place heavy strains on healthcare resources.  As this pandemic evolves, our facility, providers, and staff strive to respond fluidly, to remain operational, and to provide care relative to available resources and information. Outcomes are unpredictable and treatments are without well-defined guidelines. Further, the impact of COVID-19 on all aspects of urgent care, including the impact to patients seeking care for reasons other than COVID-19, is unavoidable during this national emergency. At this time of the global pandemic, management of patients has significantly changed, even for non-COVID positive patients given high local and regional COVID volumes at this time requiring high healthcare system and resource utilization. The standard of care for management of both COVID suspected and non-COVID suspected patients continues to change rapidly at the local, regional, national, and global levels. This patient was worked up and treated to the best available but ever changing evidence and resources available at this current time.   Documentation was completed with the aid of voice recognition software. Transcription may contain typographical errors. Final Clinical Impressions(s) / UC Diagnoses   Final diagnoses:  Nasal congestion  Encounter for screening for COVID-19  Acute non-recurrent sinusitis, unspecified location     Discharge Instructions      Take medications as prescribed. You may take tylenol or ibuprofen as needed for fevers/headache/body aches. Drink plenty of fluids. Stay in home isolation until you receive results of your COVID test. You will only be notified for positive results. You  may go online to [facility] and review your results. Go to the ED immediately if you get worse or have any other symptoms.        ED Prescriptions     Medication Sig Dispense Auth. Provider   fluticasone (FLONASE) 50 MCG/ACT nasal spray Place 2 sprays into both nostrils daily. 9.9 mL Enrique Sack, FNP   pseudoephedrine (SUDAFED 12 HOUR) 120 MG 12 hr tablet Take 1 tablet (120 mg total) by mouth 2 (two) times daily. 40 tablet Enrique Sack, FNP      PDMP not reviewed this encounter.   Enrique Sack, [place] 02/23/21 [place], [place], [place] 02/23/21 1436

## 2021-02-23 NOTE — Discharge Instructions (Addendum)
Take medications as prescribed. You may take tylenol or ibuprofen as needed for fevers/headache/body aches. Drink plenty of fluids. Stay in home isolation until you receive results of your COVID test. You will only be notified for positive results. You may go online to MyChart and review your results. Go to the ED immediately if you get worse or have any other symptoms.   

## 2021-02-24 LAB — SARS-COV-2, NAA 2 DAY TAT

## 2021-02-24 LAB — NOVEL CORONAVIRUS, NAA: SARS-CoV-2, NAA: NOT DETECTED

## 2021-03-01 DIAGNOSIS — Z419 Encounter for procedure for purposes other than remedying health state, unspecified: Secondary | ICD-10-CM | POA: Diagnosis not present

## 2021-03-04 ENCOUNTER — Encounter: Payer: Self-pay | Admitting: Family Medicine

## 2021-03-06 ENCOUNTER — Other Ambulatory Visit: Payer: Self-pay | Admitting: Family Medicine

## 2021-03-06 DIAGNOSIS — F959 Tic disorder, unspecified: Secondary | ICD-10-CM

## 2021-03-06 NOTE — Telephone Encounter (Signed)
Referral sent 

## 2021-03-10 ENCOUNTER — Telehealth: Payer: Medicaid Other | Admitting: Physician Assistant

## 2021-03-10 DIAGNOSIS — J069 Acute upper respiratory infection, unspecified: Secondary | ICD-10-CM | POA: Diagnosis not present

## 2021-03-10 MED ORDER — PSEUDOEPHEDRINE HCL ER 120 MG PO TB12: 120.0000 mg | ORAL_TABLET | Freq: Two times a day (BID) | ORAL | 0 refills | Status: DC

## 2021-03-10 MED ORDER — PREDNISONE 10 MG (21) PO TBPK: ORAL_TABLET | ORAL | 0 refills | Status: DC

## 2021-03-10 MED ORDER — IPRATROPIUM BROMIDE 0.03 % NA SOLN: 2.0000 | Freq: Two times a day (BID) | NASAL | 0 refills | Status: DC

## 2021-03-10 MED ORDER — ALBUTEROL SULFATE HFA 108 (90 BASE) MCG/ACT IN AERS: 1.0000 | INHALATION_SPRAY | Freq: Four times a day (QID) | RESPIRATORY_TRACT | 0 refills | Status: AC | PRN

## 2021-03-10 NOTE — Patient Instructions (Signed)
Katelyn Aguilar, thank you for joining Mar Daring, PA-C for today's virtual visit.  While this provider is not your primary care provider (PCP), if your PCP is located in our provider database this encounter information will be shared with them immediately following your visit.  Consent: (Patient) Katelyn Aguilar provided verbal consent for this virtual visit at the beginning of the encounter.  Current Medications:  Current Outpatient Medications:    ipratropium (ATROVENT) 0.03 % nasal spray, Place 2 sprays into both nostrils every 12 (twelve) hours., Disp: 30 mL, Rfl: 0   predniSONE (STERAPRED UNI-PAK 21 TAB) 10 MG (21) TBPK tablet, 6 day taper; take as directed on package instructions, Disp: 21 tablet, Rfl: 0   acetaminophen (TYLENOL) 500 MG tablet, Take 1 tablet (500 mg total) by mouth every 6 (six) hours as needed., Disp: 30 tablet, Rfl: 0   albuterol (VENTOLIN HFA) 108 (90 Base) MCG/ACT inhaler, Inhale 1-2 puffs into the lungs every 6 (six) hours as needed for wheezing or shortness of breath., Disp: 8 g, Rfl: 0   ARIPiprazole (ABILIFY) 5 MG tablet, Take 1 tablet (5 mg total) by mouth daily., Disp: 90 tablet, Rfl: 0   Blood Pressure KIT, Check blood pressure 2-3 times week, Disp: 1 kit, Rfl: 0   calcium carbonate (TUMS - DOSED IN MG ELEMENTAL CALCIUM) 500 MG chewable tablet, Chew 1 tablet by mouth daily as needed for indigestion or heartburn., Disp: , Rfl:    cetirizine (ZYRTEC) 10 MG tablet, Take 1 tablet (10 mg total) by mouth daily., Disp: 30 tablet, Rfl: 11   escitalopram (LEXAPRO) 10 MG tablet, TAKE 1 TABLET(10 MG) BY MOUTH DAILY, Disp: 30 tablet, Rfl: 0   fluticasone (FLONASE) 50 MCG/ACT nasal spray, Place 2 sprays into both nostrils daily., Disp: 9.9 mL, Rfl: 0   hydrOXYzine (ATARAX) 10 MG tablet, TAKE 1 TABLET(10 MG) BY MOUTH TWICE DAILY AS NEEDED. MAY INCREASE TO 3 TIMES A DAY AFTER 1 WEEK AS NEEDED, Disp: 90 tablet, Rfl: 0   ibuprofen (ADVIL) 600 MG tablet, Take 600 mg  by mouth every 6 (six) hours as needed for headache or moderate pain., Disp: , Rfl:    metoCLOPramide (REGLAN) 10 MG tablet, Take 1 tablet (10 mg total) by mouth every 8 (eight) hours as needed for nausea., Disp: 10 tablet, Rfl: 0   naproxen (NAPROSYN) 500 MG tablet, Take 1 tablet (500 mg total) by mouth 2 (two) times daily., Disp: 20 tablet, Rfl: 0   ondansetron (ZOFRAN) 4 MG tablet, Take 1 tablet (4 mg total) by mouth every 6 (six) hours., Disp: 12 tablet, Rfl: 0   pantoprazole (PROTONIX) 40 MG tablet, Take 1 tablet (40 mg total) by mouth 2 (two) times daily., Disp: 60 tablet, Rfl: 3   pseudoephedrine (SUDAFED 12 HOUR) 120 MG 12 hr tablet, Take 1 tablet (120 mg total) by mouth 2 (two) times daily., Disp: 40 tablet, Rfl: 0   scopolamine (TRANSDERM-SCOP) 1 MG/3DAYS, Place 1 patch (1.5 mg total) onto the skin every 3 (three) days., Disp: 4 patch, Rfl: 0   Medications ordered in this encounter:  Meds ordered this encounter  Medications   albuterol (VENTOLIN HFA) 108 (90 Base) MCG/ACT inhaler    Sig: Inhale 1-2 puffs into the lungs every 6 (six) hours as needed for wheezing or shortness of breath.    Dispense:  8 g    Refill:  0    Order Specific Question:   Supervising Provider    Answer:   Sabra Heck,  BRIAN [[id number]]   pseudoephedrine (SUDAFED 12 HOUR) 120 MG 12 hr tablet    Sig: Take 1 tablet (120 mg total) by mouth 2 (two) times daily.    Dispense:  40 tablet    Refill:  0    Order Specific Question:   Supervising Provider    Answer:   Sabra Heck, BRIAN [[id number]]   predniSONE (STERAPRED UNI-PAK 21 TAB) 10 MG (21) TBPK tablet    Sig: 6 day taper; take as directed on package instructions    Dispense:  21 tablet    Refill:  0    Order Specific Question:   Supervising Provider    Answer:   MILLER, BRIAN [[id number]]   ipratropium (ATROVENT) 0.03 % nasal spray    Sig: Place 2 sprays into both nostrils every 12 (twelve) hours.    Dispense:  30 mL    Refill:  0    Order Specific Question:   Supervising  Provider    Answer:   Sabra Heck, BRIAN [[id number]]     *If you need refills on other medications prior to your next appointment, please contact your pharmacy*  Follow-Up: Call back or seek an in-person evaluation if the symptoms worsen or if the condition fails to improve as anticipated.  Other Instructions Viral Respiratory Infection A respiratory infection is an illness that affects part of the respiratory system, such as the lungs, nose, or throat. A respiratory infection that is caused by a virus is called a viral respiratory infection. Common types of viral respiratory infections include: A cold. The flu (influenza). A respiratory syncytial virus (RSV) infection. What are the causes? This condition is caused by a virus. The virus may spread through contact with droplets or direct contact with infected people or their mucus or secretions. The virus may spread from person to person (is contagious). What are the signs or symptoms? Symptoms of this condition include: A stuffy or runny nose. A sore throat or cough. Shortness of breath or difficulty breathing. Yellow or green mucus (sputum). Other symptoms may include: A fever. Sweating or chills. Fatigue. Achy muscles. A headache. How is this diagnosed? This condition may be diagnosed based on: Your symptoms. A physical exam. Testing of secretions from the nose or throat. Chest X-ray. How is this treated? This condition may be treated with medicines, such as: Antiviral medicine. This may shorten the length of time a person has symptoms. Expectorants. These make it easier to cough up mucus. Decongestant nasal sprays. Acetaminophen or NSAIDs, such as ibuprofen, to relieve fever and pain. Antibiotic medicines are not prescribed for viral infections.This is because antibiotics are designed to kill bacteria. They do not kill viruses. Follow these instructions at home: Managing pain and congestion Take over-the-counter and prescription  medicines only as told by your health care provider. If you have a sore throat, gargle with a mixture of salt and water 3-4 times a day or as needed. To make salt water, completely dissolve -1 tsp (3-6 g) of salt in 1 cup (237 mL) of warm water. Use nose drops made from salt water to ease congestion and soften raw skin around your nose. Take 2 tsp (10 mL) of honey at bedtime to lessen coughing at night. Do not give honey to children who are younger than 1 year. Drink enough fluid to keep your urine pale yellow. This helps prevent dehydration and helps loosen up mucus. General instructions  Rest as much as possible. Do not drink alcohol. Do not use any products that contain  nicotine or tobacco. These products include cigarettes, chewing tobacco, and vaping devices, such as e-cigarettes. If you need help quitting, ask your health care provider. Keep all follow-up visits. This is important. How is this prevented?   Get an annual flu shot. You may get the flu shot in late summer, fall, or winter. Ask your health care provider when you should get your flu shot. Avoid spreading your infection to other people. If you are sick: Wash your hands with soap and water often, especially after you cough or sneeze. Wash for at least 20 seconds. If soap and water are not available, use alcohol-based hand sanitizer. Cover your mouth when you cough. Cover your nose and mouth when you sneeze. Do not share cups or eating utensils. Clean commonly used objects often. Clean commonly touched surfaces. Stay home from work or school as told by your health care provider. Avoid contact with people who are sick during cold and flu season. This is generally fall and winter. Contact a health care provider if: Your symptoms last for 10 days or longer. Your symptoms get worse over time. You have severe sinus pain in your face or forehead. The glands in your jaw or neck become very swollen. You have shortness of  breath. Get help right away if you: Feel pain or pressure in your chest. Have trouble breathing. Faint or feel like you will faint. Have severe and persistent vomiting. Feel confused or disoriented. These symptoms may represent a serious problem that is an emergency. Do not wait to see if the symptoms will go away. Get medical help right away. Call your local emergency services (911 in the [place] Do not drive yourself to the hospital. Summary A respiratory infection is an illness that affects part of the respiratory system, such as the lungs, nose, or throat. A respiratory infection that is caused by a virus is called a viral respiratory infection. Common types of viral respiratory infections include a cold, influenza, and respiratory syncytial virus (RSV) infection. Symptoms of this condition include a stuffy or runny nose, cough, fatigue, achy muscles, sore throat, and fevers or chills. Antibiotic medicines are not prescribed for viral infections. This is because antibiotics are designed to kill bacteria. They are not effective against viruses. This information is not intended to replace advice given to you by your health care provider. Make sure you discuss any questions you have with your health care provider. Document Revised: 04/21/2020 Document Reviewed: 04/21/2020 Elsevier Patient Education  2022 [employer].    If you have been instructed to have an in-person evaluation today at a local Urgent Care facility, please use the link below. It will take you to a list of all of our available [facility], including address, phone number and hours of operation. Please do not delay care.  [facility]  If you or a family member do not have a primary care provider, use the link below to schedule a visit and establish care. When you choose a [facility] primary care physician or advanced practice provider, you gain a long-term partner in health. Find a Primary Care  Provider  Learn more about [facility] in-office and virtual care options: [facility]

## 2021-03-10 NOTE — Progress Notes (Signed)
Virtual Visit Consent   Katelyn Aguilar, you are scheduled for a virtual visit with a [facility] provider today.     Just as with appointments in the office, your consent must be obtained to participate.  Your consent will be active for this visit and any virtual visit you may have with one of our providers in the next 365 days.     If you have a MyChart account, a copy of this consent can be sent to you electronically.  All virtual visits are billed to your insurance company just like a traditional visit in the office.    As this is a virtual visit, video technology does not allow for your provider to perform a traditional examination.  This may limit your provider's ability to fully assess your condition.  If your provider identifies any concerns that need to be evaluated in person or the need to arrange testing (such as labs, EKG, etc.), we will make arrangements to do so.     Although advances in technology are sophisticated, we cannot ensure that it will always work on either your end or our end.  If the connection with a video visit is poor, the visit may have to be switched to a telephone visit.  With either a video or telephone visit, we are not always able to ensure that we have a secure connection.     I need to obtain your verbal consent now.   Are you willing to proceed with your visit today?    Katelyn Aguilar has provided verbal consent on 03/10/2021 for a virtual visit (video or telephone).   Mar Daring, PA-C   Date: 03/10/2021 10:30 AM   Virtual Visit via Video Note   I, Mar Daring, connected with  Katelyn Aguilar  ([id number], 20-Jan-2001) on 03/10/21 at 10:15 AM EST by a video-enabled telemedicine application and verified that I am speaking with the correct person using two identifiers.  Location: Patient: Virtual Visit Location Patient: Home Provider: Virtual Visit Location Provider: Home Office   I discussed the limitations of evaluation and  management by telemedicine and the availability of in person appointments. The patient expressed understanding and agreed to proceed.    History of Present Illness: Katelyn Aguilar is a 21 y.o. who identifies as a female who was assigned female at birth, and is being seen today for recurrent URI symptoms. Was seen at [facility] on 02/23/21. Reports had only had one day where she was improving and not requiring medications, but then her fiance came home sick and she started having same symptoms.   HPI: URI  This is a new problem. The current episode started yesterday. The problem has been gradually worsening. There has been no fever. Associated symptoms include congestion, coughing (mild), ear pain (from sore throat radiates to ears when she swallows), headaches, nausea, rhinorrhea, a sore throat, vomiting (has gastroparesis) and wheezing (last night). Pertinent negatives include no diarrhea, plugged ear sensation or sinus pain. Associated symptoms comments: Chills, body aches. She has tried inhaler use, decongestant, NSAIDs, increased fluids and sleep for the symptoms. The treatment provided mild relief.     Problems:  Patient Active Problem List   Diagnosis Date Noted   Cervical cancer screening 11/29/2020   Elevated blood pressure reading 11/29/2020   Left otitis media 10/27/2020   Severe episode of recurrent major depressive disorder, without psychotic features ([place]) 10/04/2020   GAD (generalized anxiety disorder) 10/04/2020   Ingrown toenail of both  feet 08/19/2020   Pain of left breast 06/26/2020   BMI 60.0-69.9, adult ([place]) 05/19/2020   Contraception management 05/19/2020   Anxiety with depression 05/09/2020   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/26/2020   Tachycardia 02/28/2020   Fatigue 02/28/2020   Screen for STD (sexually transmitted disease) 01/12/2020   Irregular menses 12/04/2019   Dizziness 04/22/2019   Cramp in limb 04/10/2019   Simple tics 11/17/2018   Involuntary movements  10/21/2018   Encounter for birth control 10/21/2018    Allergies: No Known Allergies Medications:  Current Outpatient Medications:    ipratropium (ATROVENT) 0.03 % nasal spray, Place 2 sprays into both nostrils every 12 (twelve) hours., Disp: 30 mL, Rfl: 0   predniSONE (STERAPRED UNI-PAK 21 TAB) 10 MG (21) TBPK tablet, 6 day taper; take as directed on package instructions, Disp: 21 tablet, Rfl: 0   acetaminophen (TYLENOL) 500 MG tablet, Take 1 tablet (500 mg total) by mouth every 6 (six) hours as needed., Disp: 30 tablet, Rfl: 0   albuterol (VENTOLIN HFA) 108 (90 Base) MCG/ACT inhaler, Inhale 1-2 puffs into the lungs every 6 (six) hours as needed for wheezing or shortness of breath., Disp: 8 g, Rfl: 0   ARIPiprazole (ABILIFY) 5 MG tablet, Take 1 tablet (5 mg total) by mouth daily., Disp: 90 tablet, Rfl: 0   Blood Pressure KIT, Check blood pressure 2-3 times week, Disp: 1 kit, Rfl: 0   calcium carbonate (TUMS - DOSED IN MG ELEMENTAL CALCIUM) 500 MG chewable tablet, Chew 1 tablet by mouth daily as needed for indigestion or heartburn., Disp: , Rfl:    cetirizine (ZYRTEC) 10 MG tablet, Take 1 tablet (10 mg total) by mouth daily., Disp: 30 tablet, Rfl: 11   escitalopram (LEXAPRO) 10 MG tablet, TAKE 1 TABLET(10 MG) BY MOUTH DAILY, Disp: 30 tablet, Rfl: 0   fluticasone (FLONASE) 50 MCG/ACT nasal spray, Place 2 sprays into both nostrils daily., Disp: 9.9 mL, Rfl: 0   hydrOXYzine (ATARAX) 10 MG tablet, TAKE 1 TABLET(10 MG) BY MOUTH TWICE DAILY AS NEEDED. MAY INCREASE TO 3 TIMES A DAY AFTER 1 WEEK AS NEEDED, Disp: 90 tablet, Rfl: 0   ibuprofen (ADVIL) 600 MG tablet, Take 600 mg by mouth every 6 (six) hours as needed for headache or moderate pain., Disp: , Rfl:    metoCLOPramide (REGLAN) 10 MG tablet, Take 1 tablet (10 mg total) by mouth every 8 (eight) hours as needed for nausea., Disp: 10 tablet, Rfl: 0   naproxen (NAPROSYN) 500 MG tablet, Take 1 tablet (500 mg total) by mouth 2 (two) times daily., Disp:  20 tablet, Rfl: 0   ondansetron (ZOFRAN) 4 MG tablet, Take 1 tablet (4 mg total) by mouth every 6 (six) hours., Disp: 12 tablet, Rfl: 0   pantoprazole (PROTONIX) 40 MG tablet, Take 1 tablet (40 mg total) by mouth 2 (two) times daily., Disp: 60 tablet, Rfl: 3   pseudoephedrine (SUDAFED 12 HOUR) 120 MG 12 hr tablet, Take 1 tablet (120 mg total) by mouth 2 (two) times daily., Disp: 40 tablet, Rfl: 0   scopolamine (TRANSDERM-SCOP) 1 MG/3DAYS, Place 1 patch (1.5 mg total) onto the skin every 3 (three) days., Disp: 4 patch, Rfl: 0  Observations/Objective: Patient is well-developed, well-nourished in no acute distress.  Resting comfortably at home.  Head is normocephalic, atraumatic.  No labored breathing.  Speech is clear and coherent with logical content.  Patient is alert and oriented at baseline.    Assessment and Plan: 1. Viral URI - albuterol (  VENTOLIN HFA) 108 (90 Base) MCG/ACT inhaler; Inhale 1-2 puffs into the lungs every 6 (six) hours as needed for wheezing or shortness of breath.  Dispense: 8 g; Refill: 0 - pseudoephedrine (SUDAFED 12 HOUR) 120 MG 12 hr tablet; Take 1 tablet (120 mg total) by mouth 2 (two) times daily.  Dispense: 40 tablet; Refill: 0 - predniSONE (STERAPRED UNI-PAK 21 TAB) 10 MG (21) TBPK tablet; 6 day taper; take as directed on package instructions  Dispense: 21 tablet; Refill: 0 - ipratropium (ATROVENT) 0.03 % nasal spray; Place 2 sprays into both nostrils every 12 (twelve) hours.  Dispense: 30 mL; Refill: 0  - Suspect viral URI - Continue symptomatic management of choice OTC - Sudafed, Ipratropium bromide, albuterol inhaler and prednisone all prescribed - Push fluids - Rest - Seek in person evaluation if symptoms continue or fail to improve  Follow Up Instructions: I discussed the assessment and treatment plan with the patient. The patient was provided an opportunity to ask questions and all were answered. The patient agreed with the plan and demonstrated an  understanding of the instructions.  A copy of instructions were sent to the patient via MyChart unless otherwise noted below.   The patient was advised to call back or seek an in-person evaluation if the symptoms worsen or if the condition fails to improve as anticipated.  Time:  I spent 16 minutes with the patient via telehealth technology discussing the above problems/concerns.    Mar Daring, PA-C

## 2021-03-13 ENCOUNTER — Ambulatory Visit: Payer: Medicaid Other | Admitting: Podiatry

## 2021-03-16 ENCOUNTER — Other Ambulatory Visit: Payer: Self-pay

## 2021-03-16 ENCOUNTER — Ambulatory Visit
Admission: EM | Admit: 2021-03-16 | Discharge: 2021-03-16 | Disposition: A | Discharge status: Home / Self Care | Payer: Medicaid Other

## 2021-03-16 ENCOUNTER — Encounter: Payer: Self-pay | Admitting: Emergency Medicine

## 2021-03-16 ENCOUNTER — Encounter (HOSPITAL_COMMUNITY): Payer: Self-pay | Admitting: Emergency Medicine

## 2021-03-16 ENCOUNTER — Emergency Department (HOSPITAL_COMMUNITY)
Admission: EM | Admit: 2021-03-16 | Discharge: 2021-03-17 | Disposition: A | Discharge status: Home / Self Care | Payer: Medicaid Other | Attending: Emergency Medicine | Admitting: Emergency Medicine

## 2021-03-16 ENCOUNTER — Emergency Department (HOSPITAL_COMMUNITY): Payer: Medicaid Other

## 2021-03-16 DIAGNOSIS — Z7952 Long term (current) use of systemic steroids: Secondary | ICD-10-CM | POA: Diagnosis not present

## 2021-03-16 DIAGNOSIS — Z7951 Long term (current) use of inhaled steroids: Secondary | ICD-10-CM | POA: Insufficient documentation

## 2021-03-16 DIAGNOSIS — R0602 Shortness of breath: Secondary | ICD-10-CM | POA: Insufficient documentation

## 2021-03-16 DIAGNOSIS — D72829 Elevated white blood cell count, unspecified: Secondary | ICD-10-CM | POA: Diagnosis not present

## 2021-03-16 DIAGNOSIS — Z20822 Contact with and (suspected) exposure to covid-19: Secondary | ICD-10-CM | POA: Insufficient documentation

## 2021-03-16 LAB — COMPREHENSIVE METABOLIC PANEL
ALT: 28 U/L (ref 0–44)
AST: 19 U/L (ref 15–41)
Albumin: 4.1 g/dL (ref 3.5–5.0)
Alkaline Phosphatase: 73 U/L (ref 38–126)
Anion gap: 9 (ref 5–15)
BUN: 17 mg/dL (ref 6–20)
CO2: 23 mmol/L (ref 22–32)
Calcium: 8.9 mg/dL (ref 8.9–10.3)
Chloride: 106 mmol/L (ref 98–111)
Creatinine, Ser: 0.7 mg/dL (ref 0.44–1.00)
GFR, Estimated: >60 mL/min (ref >60)
Glucose, Bld: 91 mg/dL (ref 70–99)
Potassium: 3.6 mmol/L (ref 3.5–5.1)
Sodium: 138 mmol/L (ref 135–145)
Total Bilirubin: 0.4 mg/dL (ref 0.3–1.2)
Total Protein: 7.5 g/dL (ref 6.5–8.1)

## 2021-03-16 LAB — CBC WITH DIFFERENTIAL/PLATELET
Abs Immature Granulocytes: 0.3 10*3/uL — ABNORMAL HIGH (ref 0.00–0.07)
Basophils Absolute: 0.1 10*3/uL (ref 0.0–0.1)
Basophils Relative: 1 %
Eosinophils Absolute: 0.3 10*3/uL (ref 0.0–0.5)
Eosinophils Relative: 2 %
HCT: 43.1 % (ref 36.0–46.0)
Hemoglobin: 13.9 g/dL (ref 12.0–15.0)
Immature Granulocytes: 2 %
Lymphocytes Relative: 16 %
Lymphs Abs: 3.1 10*3/uL (ref 0.7–4.0)
MCH: 27 pg (ref 26.0–34.0)
MCHC: 32.3 g/dL (ref 30.0–36.0)
MCV: 83.7 fL (ref 80.0–100.0)
Monocytes Absolute: 1.6 10*3/uL — ABNORMAL HIGH (ref 0.1–1.0)
Monocytes Relative: 8 %
Neutro Abs: 13.5 10*3/uL — ABNORMAL HIGH (ref 1.7–7.7)
Neutrophils Relative %: 71 %
Platelets: 422 10*3/uL — ABNORMAL HIGH (ref 150–400)
RBC: 5.15 MIL/uL — ABNORMAL HIGH (ref 3.87–5.11)
RDW: 13.2 % (ref 11.5–15.5)
WBC: 18.9 10*3/uL — ABNORMAL HIGH (ref 4.0–10.5)
nRBC: 0 % (ref 0.0–0.2)

## 2021-03-16 LAB — TROPONIN I (HIGH SENSITIVITY): Troponin I (High Sensitivity): 2 ng/L (ref <18)

## 2021-03-16 LAB — HCG, QUANTITATIVE, PREGNANCY: hCG, Beta Chain, Quant, S: <1 m[IU]/mL (ref <5)

## 2021-03-16 LAB — RESP PANEL BY RT-PCR (FLU A&B, COVID) ARPGX2
Influenza A by PCR: NEGATIVE
Influenza B by PCR: NEGATIVE
SARS Coronavirus 2 by RT PCR: NEGATIVE

## 2021-03-16 NOTE — ED Provider Triage Note (Signed)
Emergency Medicine Provider Triage Evaluation Note  LADA FULBRIGHT , a 21 y.o. female  was evaluated in triage.  Pt complains of shortness of breath.  She was sent from urgent care.  She has been taking steroids and using her inhaler for a UTI.  Her SO was sick with similar.  No hormones or leg swelling. No history of DVT/PE.     Physical Exam  There were no vitals taken for this visit. Gen:   Awake, no distress   Resp:  Slight increase in effort. No wheezes or rales bilaterally. Questionable rhonchi in Left upper field.  MSK:   Moves extremities without difficulty  Other:  Normal speech.   Medical Decision Making  Medically screening exam initiated at 8:14 PM.  Appropriate orders placed.  LATEYA DAURIA was informed that the remainder of the evaluation will be completed by another provider, this initial triage assessment does not replace that evaluation, and the importance of remaining in the ED until their evaluation is complete.     Cristina Gong, [place] 03/16/21 2017

## 2021-03-16 NOTE — ED Triage Notes (Signed)
Reports new onset SOB after finishing her steroid and drinking an energy drink today. Appears hyperventilating in triage, and saying she feels dizzy. Also reports using an albuterol inhaler before arrival.

## 2021-03-16 NOTE — ED Provider Notes (Signed)
[facility]    CSN: [id number] Arrival date & time: 03/16/21  1900      History   Chief Complaint Chief Complaint  Patient presents with   Shortness of Breath    HPI Katelyn Aguilar is a 21 y.o. female.   Patient presents with shortness of breath that started approximately 1.5 hours prior to arrival.  Patient has been seen multiple times due to upper respiratory infection over the past few weeks but has not had shortness of breath.  Patient was seen on 02/23/2021 at urgent care and was prescribed Sudafed and Flonase.  She was seen by video visit on 03/10/2021 and prescribed prednisone as well as multiple inhalers.  She reports that she has not seen much improvement.  She used the albuterol inhaler prior to arrival with no improvement.  She denies history of asthma and patient does not smoke.   Shortness of Breath  Past Medical History:  Diagnosis Date   Allergic rhinitis    Anxiety    Anxiety 11/17/2018   Dysrhythmia    tachycardia   GERD (gastroesophageal reflux disease)    Nausea and vomiting 04/06/2019   Obesity    Pilonidal abscess 12/04/2019   Pilonidal cyst without abscess 04/18/2020   Pyelonephritis 09/15/2019    Patient Active Problem List   Diagnosis Date Noted   Cervical cancer screening 11/29/2020   Elevated blood pressure reading 11/29/2020   Left otitis media 10/27/2020   Severe episode of recurrent major depressive disorder, without psychotic features ([place]) 10/04/2020   GAD (generalized anxiety disorder) 10/04/2020   Ingrown toenail of both feet 08/19/2020   Pain of left breast 06/26/2020   BMI 60.0-69.9, adult ([place]) 05/19/2020   Contraception management 05/19/2020   Anxiety with depression 05/09/2020   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/26/2020   Tachycardia 02/28/2020   Fatigue 02/28/2020   Screen for STD (sexually transmitted disease) 01/12/2020   Irregular menses 12/04/2019   Dizziness 04/22/2019   Cramp in limb 04/10/2019   Simple tics  11/17/2018   Involuntary movements 10/21/2018   Encounter for birth control 10/21/2018    Past Surgical History:  Procedure Laterality Date   BIOPSY  04/19/2020   Procedure: BIOPSY;  Surgeon: Thornton Park, MD;  Location: [facility];  Service: Gastroenterology;;   ESOPHAGOGASTRODUODENOSCOPY     ESOPHAGOGASTRODUODENOSCOPY (EGD) WITH PROPOFOL N/A 04/19/2020   Procedure: ESOPHAGOGASTRODUODENOSCOPY (EGD) WITH PROPOFOL;  Surgeon: Thornton Park, MD;  Location: [facility];  Service: Gastroenterology;  Laterality: N/A;   INCISION AND DRAINAGE ABSCESS N/A 04/26/2020   Procedure: excision of pilonidal cyst with open packing;  Surgeon: Armandina Gemma, MD;  Location: [facility];  Service: General;  Laterality: N/A;   NO PAST SURGERIES      OB History   No obstetric history on file.      Home Medications    Prior to Admission medications   Medication Sig Start Date End Date Taking? Authorizing Provider  acetaminophen (TYLENOL) 500 MG tablet Take 1 tablet (500 mg total) by mouth every 6 (six) hours as needed. 12/21/20   Varney Biles, MD  albuterol (VENTOLIN HFA) 108 (90 Base) MCG/ACT inhaler Inhale 1-2 puffs into the lungs every 6 (six) hours as needed for wheezing or shortness of breath. 03/10/21   Mar Daring, PA-C  ARIPiprazole (ABILIFY) 5 MG tablet Take 1 tablet (5 mg total) by mouth daily. 10/06/20   Ezequiel Essex, MD  Blood Pressure KIT Check blood pressure 2-3 times week 11/28/20   Carollee Leitz,  MD  calcium carbonate (TUMS - DOSED IN MG ELEMENTAL CALCIUM) 500 MG chewable tablet Chew 1 tablet by mouth daily as needed for indigestion or heartburn.    [provider]  cetirizine (ZYRTEC) 10 MG tablet Take 1 tablet (10 mg total) by mouth daily. 11/10/19   Carollee Leitz, MD  escitalopram (LEXAPRO) 10 MG tablet TAKE 1 TABLET(10 MG) BY MOUTH DAILY 10/25/20   Carollee Leitz, MD  fluticasone [facility]) 50 MCG/ACT nasal spray Place 2 sprays into both nostrils daily. 02/23/21    Enrique Sack, FNP  hydrOXYzine (ATARAX) 10 MG tablet TAKE 1 TABLET(10 MG) BY MOUTH TWICE DAILY AS NEEDED. MAY INCREASE TO 3 TIMES A DAY AFTER 1 WEEK AS NEEDED 01/30/21   Carollee Leitz, MD  ibuprofen (ADVIL) 600 MG tablet Take 600 mg by mouth every 6 (six) hours as needed for headache or moderate pain.    [provider]  ipratropium (ATROVENT) 0.03 % nasal spray Place 2 sprays into both nostrils every 12 (twelve) hours. 03/10/21   Mar Daring, PA-C  metoCLOPramide (REGLAN) 10 MG tablet Take 1 tablet (10 mg total) by mouth every 8 (eight) hours as needed for nausea. 12/18/20   Petrucelli, Samantha R, PA-C  naproxen (NAPROSYN) 500 MG tablet Take 1 tablet (500 mg total) by mouth 2 (two) times daily. 12/21/20   Varney Biles, MD  ondansetron (ZOFRAN) 4 MG tablet Take 1 tablet (4 mg total) by mouth every 6 (six) hours. 12/21/20   Varney Biles, MD  pantoprazole (PROTONIX) 40 MG tablet Take 1 tablet (40 mg total) by mouth 2 (two) times daily. 03/09/20   Thornton Park, MD  predniSONE (STERAPRED UNI-PAK 21 TAB) 10 MG (21) TBPK tablet 6 day taper; take as directed on package instructions 03/10/21   Mar Daring, PA-C  pseudoephedrine (SUDAFED 12 HOUR) 120 MG 12 hr tablet Take 1 tablet (120 mg total) by mouth 2 (two) times daily. 03/10/21   Mar Daring, PA-C  scopolamine (TRANSDERM-SCOP) 1 MG/3DAYS Place 1 patch (1.5 mg total) onto the skin every 3 (three) days. 10/06/20   Ezequiel Essex, MD    Family History Family History  Problem Relation Age of Onset   COPD Mother    Depression Mother    Cancer Mother        blood cancer   Diabetes Other    Colon cancer Neg Hx    Esophageal cancer Neg Hx    Pancreatic cancer Neg Hx    Stomach cancer Neg Hx     Social History Social History   Tobacco Use   Smoking status: Never    Passive exposure: Yes   Smokeless tobacco: Never  Vaping Use   Vaping Use: Former  Substance Use Topics   Alcohol use: Yes    Comment:  occasional   Drug use: No     Allergies   Patient has no known allergies.   Review of Systems Review of Systems Per HPI  Physical Exam Triage Vital Signs ED Triage Vitals [03/16/21 1920]  Enc Vitals Group     BP      Pulse      Resp      Temp      Temp src      SpO2      Weight      Height      Head Circumference      Peak Flow      Pain Score 2     Pain Loc  Pain Edu?      Excl. in [place]?    No data found.  Updated Vital Signs There were no vitals taken for this visit.  Visual Acuity Right Eye Distance:   Left Eye Distance:   Bilateral Distance:    Right Eye Near:   Left Eye Near:    Bilateral Near:     Physical Exam Constitutional:      General: She is in acute distress.     Appearance: Normal appearance. She is ill-appearing. She is not toxic-appearing.  HENT:     Head: Normocephalic and atraumatic.     Right Ear: Tympanic membrane and ear canal normal.     Left Ear: Tympanic membrane and ear canal normal.     Nose: Congestion present.     Mouth/Throat:     Mouth: Mucous membranes are moist.     Pharynx: No posterior oropharyngeal erythema.  Eyes:     Extraocular Movements: Extraocular movements intact.     Conjunctiva/sclera: Conjunctivae normal.     Pupils: Pupils are equal, round, and reactive to light.  Cardiovascular:     Rate and Rhythm: Normal rate and regular rhythm.     Pulses: Normal pulses.     Heart sounds: Normal heart sounds.  Pulmonary:     Effort: No respiratory distress.     Breath sounds: Normal breath sounds. No stridor. No wheezing, rhonchi or rales.     Comments: Tachypnea on exam. Abdominal:     General: Abdomen is flat. Bowel sounds are normal.     Palpations: Abdomen is soft.  Musculoskeletal:        General: Normal range of motion.     Cervical back: Normal range of motion.  Skin:    General: Skin is warm and dry.  Neurological:     General: No focal deficit present.     Mental Status: She is alert and  oriented to person, place, and time. Mental status is at baseline.  Psychiatric:        Mood and Affect: Mood normal.        Behavior: Behavior normal.     UC Treatments / Results  Labs (all labs ordered are listed, but only abnormal results are displayed) Labs Reviewed - No data to display  EKG   Radiology No results found.  Procedures Procedures (including critical care time)  Medications Ordered in UC Medications - No data to display  Initial Impression / Assessment and Plan / UC Course  I have reviewed the triage vital signs and the nursing notes.  Pertinent labs & imaging results that were available during my care of the patient were reviewed by me and considered in my medical decision making (see chart for details).     Due to appearance of breathing on exam, patient was advised that she will need to go to the hospital for more extensive evaluation and management.  Patient was agreeable with plan.  Suggested EMS transport due to patient's appearance but patient declined.  Patient left via self transport. Final Clinical Impressions(s) / UC Diagnoses   Final diagnoses:  Shortness of breath     Discharge Instructions      Please go to the hospital as soon as you leave urgent care for further evaluation and management    ED Prescriptions   None    PDMP not reviewed this encounter.   Teodora Medici, [place] 03/16/21 [phone]

## 2021-03-16 NOTE — Discharge Instructions (Signed)
Please go to the hospital as soon as you leave urgent care for further evaluation and management. 

## 2021-03-17 MED ORDER — AEROCHAMBER Z-STAT PLUS/MEDIUM MISC
1.0000 | Freq: Once | Status: AC
  Administered 2021-03-17: 1
  Filled 2021-03-17: qty 1

## 2021-03-17 MED ORDER — ALBUTEROL SULFATE HFA 108 (90 BASE) MCG/ACT IN AERS
2.0000 | INHALATION_SPRAY | Freq: Once | RESPIRATORY_TRACT | Status: AC
  Administered 2021-03-17: 2 via RESPIRATORY_TRACT
  Filled 2021-03-17: qty 6.7

## 2021-03-17 NOTE — ED Provider Notes (Signed)
[facility] DEPT Provider Note   CSN: [id number] Arrival date & time: 03/16/21  2002     History  Chief Complaint  Patient presents with   Shortness of Breath    Katelyn Aguilar is a 21 y.o. female.  HPI  21 year old female with a history of pilonidal abscess, pyelonephritis, nausea vomiting, anxiety, allergic rhinitis, anxiety, dysrhythmia, GERD, obesity, who presents emergency department today for evaluation of shortness of breath.  Patient states she has been dealing with upper respiratory infection for the past 2 weeks.  She was sick for about a week then felt better for couple of days and got another upper respiratory infection.  She was tested for COVID which is negative.  She has not had any fevers.  She was given prednisone as well as an inhaler which she just finished up but states that her symptoms are not completely resolved.  She has had some improvement of symptoms.  She denies any chest pain but she does have some pain to her upper back.  She also has some intermittent pain with breathing.  Home Medications Prior to Admission medications   Medication Sig Start Date End Date Taking? Authorizing Provider  acetaminophen (TYLENOL) 500 MG tablet Take 1 tablet (500 mg total) by mouth every 6 (six) hours as needed. 12/21/20   Varney Biles, MD  albuterol (VENTOLIN HFA) 108 (90 Base) MCG/ACT inhaler Inhale 1-2 puffs into the lungs every 6 (six) hours as needed for wheezing or shortness of breath. 03/10/21   Mar Daring, PA-C  ARIPiprazole (ABILIFY) 5 MG tablet Take 1 tablet (5 mg total) by mouth daily. 10/06/20   Ezequiel Essex, MD  Blood Pressure KIT Check blood pressure 2-3 times week 11/28/20   Carollee Leitz, MD  calcium carbonate (TUMS - DOSED IN MG ELEMENTAL CALCIUM) 500 MG chewable tablet Chew 1 tablet by mouth daily as needed for indigestion or heartburn.    [provider]  cetirizine (ZYRTEC) 10 MG tablet Take 1 tablet (10 mg  total) by mouth daily. 11/10/19   Carollee Leitz, MD  escitalopram (LEXAPRO) 10 MG tablet TAKE 1 TABLET(10 MG) BY MOUTH DAILY 10/25/20   Carollee Leitz, MD  fluticasone [facility]) 50 MCG/ACT nasal spray Place 2 sprays into both nostrils daily. 02/23/21   Enrique Sack, FNP  hydrOXYzine (ATARAX) 10 MG tablet TAKE 1 TABLET(10 MG) BY MOUTH TWICE DAILY AS NEEDED. MAY INCREASE TO 3 TIMES A DAY AFTER 1 WEEK AS NEEDED 01/30/21   Carollee Leitz, MD  ibuprofen (ADVIL) 600 MG tablet Take 600 mg by mouth every 6 (six) hours as needed for headache or moderate pain.    [provider]  ipratropium (ATROVENT) 0.03 % nasal spray Place 2 sprays into both nostrils every 12 (twelve) hours. 03/10/21   Mar Daring, PA-C  metoCLOPramide (REGLAN) 10 MG tablet Take 1 tablet (10 mg total) by mouth every 8 (eight) hours as needed for nausea. 12/18/20   Petrucelli, Samantha R, PA-C  naproxen (NAPROSYN) 500 MG tablet Take 1 tablet (500 mg total) by mouth 2 (two) times daily. 12/21/20   Varney Biles, MD  ondansetron (ZOFRAN) 4 MG tablet Take 1 tablet (4 mg total) by mouth every 6 (six) hours. 12/21/20   Varney Biles, MD  pantoprazole (PROTONIX) 40 MG tablet Take 1 tablet (40 mg total) by mouth 2 (two) times daily. 03/09/20   Thornton Park, MD  predniSONE (STERAPRED UNI-PAK 21 TAB) 10 MG (21) TBPK tablet 6 day taper; take as directed  on package instructions 03/10/21   Mar Daring, PA-C  pseudoephedrine (SUDAFED 12 HOUR) 120 MG 12 hr tablet Take 1 tablet (120 mg total) by mouth 2 (two) times daily. 03/10/21   Mar Daring, PA-C  scopolamine (TRANSDERM-SCOP) 1 MG/3DAYS Place 1 patch (1.5 mg total) onto the skin every 3 (three) days. 10/06/20   Ezequiel Essex, MD      Allergies    Patient has no known allergies.    Review of Systems   Review of Systems See HPI for pertinent positives or negatives.   Physical Exam Updated Vital Signs BP (!) 151/88    Pulse (!) 102    Temp 98.7 F (37.1 C)  (Oral)    Resp 20    Ht '5\' 3"'  (1.6 m)    Wt (!) 149.7 kg    LMP  (LMP Unknown)    SpO2 97%    BMI 58.46 kg/m  Physical Exam Vitals and nursing note reviewed.  Constitutional:      General: She is not in acute distress.    Appearance: She is well-developed.  HENT:     Head: Normocephalic and atraumatic.  Eyes:     Conjunctiva/sclera: Conjunctivae normal.  Cardiovascular:     Rate and Rhythm: Normal rate and regular rhythm.     Heart sounds: No murmur heard. Pulmonary:     Effort: Pulmonary effort is normal. No respiratory distress.     Breath sounds: Normal breath sounds. No decreased breath sounds, wheezing or rhonchi.  Abdominal:     Palpations: Abdomen is soft.     Tenderness: There is no abdominal tenderness.  Musculoskeletal:        General: No swelling.     Cervical back: Neck supple.  Skin:    General: Skin is warm and dry.     Capillary Refill: Capillary refill takes less than 2 seconds.  Neurological:     Mental Status: She is alert.  Psychiatric:        Mood and Affect: Mood normal.    ED Results / Procedures / Treatments   Labs (all labs ordered are listed, but only abnormal results are displayed) Labs Reviewed  CBC WITH DIFFERENTIAL/PLATELET - Abnormal; Notable for the following components:      Result Value   WBC 18.9 (*)    RBC 5.15 (*)    Platelets 422 (*)    Neutro Abs 13.5 (*)    Monocytes Absolute 1.6 (*)    Abs Immature Granulocytes 0.30 (*)    All other components within normal limits  RESP PANEL BY RT-PCR (FLU A&B, COVID) ARPGX2  COMPREHENSIVE METABOLIC PANEL  HCG, QUANTITATIVE, PREGNANCY  TROPONIN I (HIGH SENSITIVITY)  TROPONIN I (HIGH SENSITIVITY)    EKG EKG Interpretation  Date/Time:  Thursday March 16 2021 20:14:57 EST Ventricular Rate:  100 PR Interval:  124 QRS Duration: 91 QT Interval:  354 QTC Calculation: 457 R Axis:   71 Text Interpretation: Sinus tachycardia Borderline Q waves in inferior leads No significant change since  last tracing Confirmed by Dorie Rank [phone]) on 03/16/2021 8:20:27 PM  Radiology DG Chest 2 View  Result Date: 03/16/2021 CLINICAL DATA:  Shortness of breath. EXAM: CHEST - 2 VIEW COMPARISON:  12/18/2020. FINDINGS: The heart size and mediastinal contours are within normal limits. Both lungs are clear. No acute osseous abnormality. IMPRESSION: No active cardiopulmonary disease. Electronically Signed   By: Brett Fairy M.D.   On: 03/16/2021 20:31    Procedures Procedures  Medications Ordered in ED Medications  albuterol (VENTOLIN HFA) 108 (90 Base) MCG/ACT inhaler 2 puff (2 puffs Inhalation Given 03/17/21 0347)  aerochamber Z-Stat Plus/medium 1 each (1 each Other Given 03/17/21 0347)    ED Course/ Medical Decision Making/ A&P                           Medical Decision Making Amount and/or Complexity of Data Reviewed Labs: ordered. Radiology: ordered.   This patient presents to the ED for concern of sob, this involves an extensive number of treatment options, and is a complaint that carries with it a high risk of complications and morbidity.  The differential diagnosis includes but is not limited to uri, asthma, copd, acs, pe, chf, pna, ptx   Comorbidities that complicate the patient evaluation: Patients presentation is complicated by their history of tachycardia, obesity  Social Determinants of Health: Patients  financial strain, food insecurity   increases the complexity of managing their presentation  Additional history obtained: Records reviewed Care Everywhere/External Records  Lab Tests: I Ordered, and personally interpreted labs.  The pertinent results include:   Cbc with leukocytosis, no anemia CMP unremarkable Trop neg Covid/flu negative  EKG - Sinus tachycardia Borderline Q waves in inferior leads No significant change since last tracing  Imaging Studies ordered: I ordered, independently visualized, and interpreted imaging which showed   No active  cardiopulmonary disease.   I agree with the radiologist interpretation   Medicines ordered and prescription drug management: I ordered medication including albuterol  for sob  Reevaluation of the patient after these medicines showed that the patient    improved  Test Considered: ddimer to r/o PE. Pt declined this test. \  Critical Interventions: albuterol  Complexity of problems addressed: Patients presentation is most consistent with  acute complicated illness/injury requiring diagnostic workup  Disposition: After consideration of the diagnostic results and the patients response to treatment,  I feel that the patent would benefit from discharge with continued use of albuterol inhaler. I suspect sxs are due to her viral infection. No pna on cxr, suspect leukocytosis is from steroid use. I do not think she needs additional steroids or antibiotics. Advised her to f/u with pcp and return if worse.  .   Final Clinical Impression(s) / ED Diagnoses Final diagnoses:  SOB (shortness of breath)    Rx / DC Orders ED Discharge Orders     None         Bishop Dublin 03/17/21 Harolyn Rutherford, MD 03/17/21 [phone]

## 2021-03-17 NOTE — Discharge Instructions (Addendum)
Please follow up with your primary care provider within 5-7 days for re-evaluation of your symptoms. If you do not have a primary care provider, information for a healthcare clinic has been provided for you to make arrangements for follow up care. Please return to the emergency department for any new or worsening symptoms. ° °

## 2021-03-20 ENCOUNTER — Ambulatory Visit: Payer: Medicaid Other | Admitting: Podiatry

## 2021-03-20 ENCOUNTER — Telehealth: Payer: Self-pay

## 2021-03-20 NOTE — Telephone Encounter (Signed)
Transition Care Management Unsuccessful Follow-up Telephone Call ° °Date of discharge and from where:  03/17/2021 from [facility] ° °Attempts:  1st Attempt ° °Reason for unsuccessful TCM follow-up call:  Left voice message ° ° ° °

## 2021-03-21 ENCOUNTER — Ambulatory Visit: Payer: Medicaid Other | Admitting: Gastroenterology

## 2021-03-21 NOTE — Telephone Encounter (Signed)
Transition Care Management Unsuccessful Follow-up Telephone Call ° °Date of discharge and from where:  03/17/2021 from [facility] ° °Attempts:  2nd Attempt ° °Reason for unsuccessful TCM follow-up call:  Left voice message ° ° ° °

## 2021-03-22 ENCOUNTER — Ambulatory Visit (INDEPENDENT_AMBULATORY_CARE_PROVIDER_SITE_OTHER): Payer: Medicaid Other | Admitting: Podiatry

## 2021-03-22 ENCOUNTER — Other Ambulatory Visit: Payer: Self-pay

## 2021-03-22 DIAGNOSIS — L600 Ingrowing nail: Secondary | ICD-10-CM

## 2021-03-22 MED ORDER — GENTAMICIN SULFATE 0.1 % EX CREA: 1.0000 "application " | TOPICAL_CREAM | Freq: Two times a day (BID) | CUTANEOUS | 1 refills | Status: DC

## 2021-03-22 NOTE — Patient Instructions (Signed)

## 2021-03-22 NOTE — Progress Notes (Signed)
° °  Subjective: Patient presents today for evaluation of pain to the lateral border right great toe.  Patient has had the toenail removed in the past by her PCP.  She says it grew back now. Patient is concerned for possible ingrown nail.  It is very sensitive to touch.  Patient presents today for further treatment and evaluation.  Past Medical History:  Diagnosis Date   Allergic rhinitis    Anxiety    Anxiety 11/17/2018   Dysrhythmia    tachycardia   GERD (gastroesophageal reflux disease)    Nausea and vomiting 04/06/2019   Obesity    Pilonidal abscess 12/04/2019   Pilonidal cyst without abscess 04/18/2020   Pyelonephritis 09/15/2019    Objective:  General: Well developed, nourished, in no acute distress, alert and oriented x3   Dermatology: Skin is warm, dry and supple bilateral.  Lateral border right great toe appears to be erythematous with evidence of an ingrowing nail. Pain on palpation noted to the border of the nail fold. The remaining nails appear unremarkable at this time. There are no open sores, lesions.  Vascular: Dorsalis Pedis artery and Posterior Tibial artery pedal pulses palpable. No lower extremity edema noted.   Neruologic: Grossly intact via light touch bilateral.  Musculoskeletal: Muscular strength within normal limits in all groups bilateral. Normal range of motion noted to all pedal and ankle joints.   Assesement: #1 Paronychia with ingrowing nail lateral border right great toe #2 Pain in toe  Plan of Care:  1. Patient evaluated.  2. Discussed treatment alternatives and plan of care. Explained nail avulsion procedure and post procedure course to patient. 3. Patient opted for permanent partial nail avulsion of the ingrown portion of the nail.  4. Prior to procedure, local anesthesia infiltration utilized using 3 ml of a 50:50 mixture of 2% plain lidocaine and 0.5% plain marcaine in a normal hallux block fashion and a betadine prep performed.  5. Partial  permanent nail avulsion with chemical matrixectomy performed using 3x30sec applications of phenol followed by alcohol flush.  6. Light dressing applied.  Post care instructions provided 7.  Prescription for gentamicin 2% cream  8.  Return to clinic 2 weeks.  *Currently not working  Felecia Shelling, DPM [facility]  Dr. Felecia Shelling, DPM    [address] [place], [place] [address]                Office [phone]  Fax [phone]

## 2021-03-23 NOTE — Telephone Encounter (Signed)
Transition Care Management Unsuccessful Follow-up Telephone Call  Date of discharge and from where:  03/17/2021 from [facility]  Attempts:  3rd Attempt  Reason for unsuccessful TCM follow-up call:  Unable to leave message

## 2021-03-25 ENCOUNTER — Other Ambulatory Visit: Payer: Self-pay

## 2021-03-25 ENCOUNTER — Ambulatory Visit (HOSPITAL_COMMUNITY)
Admission: EM | Admit: 2021-03-25 | Discharge: 2021-03-25 | Disposition: A | Discharge status: Home / Self Care | Payer: Medicaid Other

## 2021-03-25 DIAGNOSIS — J019 Acute sinusitis, unspecified: Secondary | ICD-10-CM | POA: Diagnosis not present

## 2021-03-25 NOTE — ED Notes (Signed)
Patient access staff documented patient left [facility]

## 2021-03-29 DIAGNOSIS — Z419 Encounter for procedure for purposes other than remedying health state, unspecified: Secondary | ICD-10-CM | POA: Diagnosis not present

## 2021-04-04 ENCOUNTER — Telehealth: Payer: Self-pay | Admitting: Family Medicine

## 2021-04-04 NOTE — Telephone Encounter (Signed)
.. ?  Medicaid Managed Care  ? ?Unsuccessful Outreach Note ? ?04/04/2021 ?Name: Katelyn Aguilar MRN: [id number] DOB: [birth year] ? ?Referred by: Dana Allan, MD ?Reason for referral : High Risk Managed Medicaid (I called the patient today to get her scheduled with the MM Team. She did not answer but I left my name and number on her VM.) ? ? ?An unsuccessful telephone outreach was attempted today. The patient was referred to the case management team for assistance with care management and care coordination.  ? ?Follow Up Plan: The care management team will reach out to the patient again over the next 14 days.  ? ?Weston Settle ?Care Guide, High Risk Medicaid Managed Care ?Embedded Care Coordination ?[facility]  ? ? ? ?

## 2021-04-05 ENCOUNTER — Ambulatory Visit (INDEPENDENT_AMBULATORY_CARE_PROVIDER_SITE_OTHER): Payer: Medicaid Other | Admitting: Podiatry

## 2021-04-05 ENCOUNTER — Other Ambulatory Visit: Payer: Self-pay

## 2021-04-05 DIAGNOSIS — L600 Ingrowing nail: Secondary | ICD-10-CM | POA: Diagnosis not present

## 2021-04-05 DIAGNOSIS — R52 Pain, unspecified: Secondary | ICD-10-CM | POA: Diagnosis not present

## 2021-04-05 NOTE — Progress Notes (Signed)
? ?  Subjective: ?Patient presents today for follow-up evaluation of a nail matricectomy that was performed to the lateral border of the right great toe on 03/22/2021.  Patient states that the lateral border of the right great toe feels well and is healing appropriately.  She has been soaking her foot and applying antibiotic cream as instructed.   ? ?Patient states that now the medial border of her right great toe is hurting.  She says that after she left the visit she began to notice pain with swelling and erythema to the medial nail fold and she is concerned for ingrown toenail.  She would like to have the nail matricectomy procedure performed today to the right great toe medial border. Patient presents today for further treatment and evaluation. ? ?Past Medical History:  ?Diagnosis Date  ? Allergic rhinitis   ? Anxiety   ? Anxiety 11/17/2018  ? Dysrhythmia   ? tachycardia  ? GERD (gastroesophageal reflux disease)   ? Nausea and vomiting 04/06/2019  ? Obesity   ? Pilonidal abscess 12/04/2019  ? Pilonidal cyst without abscess 04/18/2020  ? Pyelonephritis 09/15/2019  ? ? ?Objective:  ?General: Well developed, nourished, in no acute distress, alert and oriented x3  ? ?Dermatology: Skin is warm, dry and supple bilateral.  Lateral border of the right great toe appears to be healing appropriately.  Medial border of the right great toe appears to be erythematous with evidence of an ingrowing nail. Pain on palpation noted to the border of the nail fold. The remaining nails appear unremarkable at this time. There are no open sores, lesions. ? ?Vascular: Dorsalis Pedis artery and Posterior Tibial artery pedal pulses palpable. No lower extremity edema noted.  ? ?Neruologic: Grossly intact via light touch bilateral. ? ?Musculoskeletal: Muscular strength within normal limits in all groups bilateral. Normal range of motion noted to all pedal and ankle joints.  ? ?Assesement: ?#1 Paronychia with ingrowing nail medial border right great  toe ?#2  Status post partial nail matricectomy lateral border right great toe.  03/22/2021 ? ?Plan of Care:  ?1. Patient evaluated.  ?2. Discussed treatment alternatives and plan of care. Explained nail avulsion procedure and post procedure course to patient. ?3. Patient opted for permanent partial nail avulsion of the ingrown portion of the nail.  ?4. Prior to procedure, local anesthesia infiltration utilized using 3 ml of a 50:50 mixture of 2% plain lidocaine and 0.5% plain marcaine in a normal hallux block fashion and a betadine prep performed.  ?5. Partial permanent nail avulsion with chemical matrixectomy performed using 3x30sec applications of phenol followed by alcohol flush.  ?6. Light dressing applied.  Post care instructions provided ?7.  Prescription for gentamicin 2% cream  ?8.  Return to clinic 2 weeks. ? ?Felecia Shelling, DPM ?[facility] ? ?Dr. Felecia Shelling, DPM  ?  ?[address]                                       ?[place], [place] [address]                ?Office [phone]  ?Fax [phone] ? ? ? ? ?

## 2021-04-17 ENCOUNTER — Ambulatory Visit (INDEPENDENT_AMBULATORY_CARE_PROVIDER_SITE_OTHER): Payer: Medicaid Other | Admitting: Gastroenterology

## 2021-04-17 ENCOUNTER — Encounter: Payer: Self-pay | Admitting: Gastroenterology

## 2021-04-17 VITALS — BP 116/70 | HR 109 | Ht 63.0 in | Wt 335.0 lb

## 2021-04-17 DIAGNOSIS — K3184 Gastroparesis: Secondary | ICD-10-CM

## 2021-04-17 DIAGNOSIS — R1319 Other dysphagia: Secondary | ICD-10-CM

## 2021-04-17 DIAGNOSIS — K219 Gastro-esophageal reflux disease without esophagitis: Secondary | ICD-10-CM | POA: Diagnosis not present

## 2021-04-17 NOTE — Patient Instructions (Addendum)
It was my pleasure to provide care to you today. Based on our discussion, I am providing you with my recommendations below: ? ?RECOMMENDATION(S):  ? ?I did not change your medications today. ? ?Your symptoms from gastroparesis may return. Please contact me if they recur and if I can provide any help in the future. ? ?Please call me if your swallowing difficult recurs so that we can plan a swallowing x-ray. Sometimes treating acid reflux can improve swallowing difficulties.  ? ?FOLLOW UP: ? ?I would like for you to follow up with me as needed. Please call the office at [phone] to schedule your appointment. ? ?BMI: ? ?If you are age 22 or younger, your body mass index should be between 19-25. Your Body mass index is 59.34 kg/m?Marland Kitchen If this is out of the aformentioned range listed, please consider follow up with your Primary Care Provider.  ? ?MY CHART: ? ?The [facility] providers would like to encourage you to use [facility] to communicate with providers for non-urgent requests or questions.  Due to long hold times on the telephone, sending your provider a message by [facility] may be a faster and more efficient way to get a response.  Please allow 48 business hours for a response.  Please remember that this is for non-urgent requests.  ? ?Thank you for trusting me with your gastrointestinal care!   ? ?Tressia Danas, MD, MPH ? ?

## 2021-04-17 NOTE — Progress Notes (Signed)
? ?Referring Provider: Carollee Leitz, MD ?Primary Care Physician:  Carollee Leitz, MD ? ?Chief complaint:  Gastroparesis ? ? ?IMPRESSION:  ?Gastroparesis with associated symptoms of nausea, vomiting, and epigastric discomfort. Now nearly improved since she stopped Abilify. Symptoms may recur.  ? ?Reflux controlled on PRN meds ? ?Intermittent dysphagia not explained by recent EGD including esophageal biopsies. May be related to GERD-related dysmotility but she is not interested in taking daily PPI meds at this time. Will consider barium esophagram with recurrent symptoms.  ? ?BMI 59 ? ?PLAN: ?- Continue current gastroparesis diet and medication regimen without changes ?- Consider TCA or mirtazapine if symptoms worsen ?- Follow-up as needed ? ?Please see the "Patient Instructions" section for addition details about the plan. ? ?HPI: Katelyn Aguilar is a 21 y.o. female who returns in follow-up with gastroparesis. She was last seen 07/12/20 by Ellouise Newer.  ? ?Her GI symptoms have improved since she stopped her Abilify.  ? ?She will now only use TUMS PRN. She has not needed to taking any other medications.  ? ?She is looking for a new job working the third shift so she can help care for her mother and her autistic nephew. ? ?She is eating what she can when she is hungry and tries to listen to her body. She finds it difficult to comply with low residue/low fiber/low fat diet, multiple small meals a day, 5-6, separate solids and liquids and drink only sips during meals and hydrate with 4 ounces of water at a time in between meals. Declined nutrition consult.  ? ?Has intermittent reflux from time to time.  ? ?Has has a few episodes where she feels like solid foods are getting stuck in her esophagus. Will chase the food bolus with a sip of water with success. No odynophagia.  ? ?Recent evaluation: ?- Normal LFTs and lipase 09/11/19 ?- CT ab/pelvis with contrast 09/12/19: no acute process ?- H pylori breath test negative  10/06/19 ?- EGD with gastropathy 04/19/20. Esophageal and duodenal biopsies normal. Parietal cell hyperplasia.  ?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/20 ? ?Past Medical History:  ?Diagnosis Date  ? Allergic rhinitis   ? Anxiety   ? Anxiety 11/17/2018  ? Dysrhythmia   ? tachycardia  ? GERD (gastroesophageal reflux disease)   ? Nausea and vomiting 04/06/2019  ? Obesity   ? Pilonidal abscess 12/04/2019  ? Pilonidal cyst without abscess 04/18/2020  ? Pyelonephritis 09/15/2019  ? ? ?Past Surgical History:  ?Procedure Laterality Date  ? BIOPSY  04/19/2020  ? Procedure: BIOPSY;  Surgeon: Thornton Park, MD;  Location: [facility];  Service: Gastroenterology;;  ? ESOPHAGOGASTRODUODENOSCOPY    ? ESOPHAGOGASTRODUODENOSCOPY (EGD) WITH PROPOFOL N/A 04/19/2020  ? Procedure: ESOPHAGOGASTRODUODENOSCOPY (EGD) WITH PROPOFOL;  Surgeon: Thornton Park, MD;  Location: [facility];  Service: Gastroenterology;  Laterality: N/A;  ? INCISION AND DRAINAGE ABSCESS N/A 04/26/2020  ? Procedure: excision of pilonidal cyst with open packing;  Surgeon: Armandina Gemma, MD;  Location: [facility]S;  Service: General;  Laterality: N/A;  ? NO PAST SURGERIES    ? ? ?Current Outpatient Medications  ?Medication Sig Dispense Refill  ? acetaminophen (TYLENOL) 500 MG tablet Take 1 tablet (500 mg total) by mouth every 6 (six) hours as needed. 30 tablet 0  ? albuterol (VENTOLIN HFA) 108 (90 Base) MCG/ACT inhaler Inhale 1-2 puffs into the lungs every 6 (six) hours as needed for wheezing or shortness of breath. 8 g 0  ? calcium carbonate (TUMS - DOSED IN MG ELEMENTAL  CALCIUM) 500 MG chewable tablet Chew 1 tablet by mouth daily as needed for indigestion or heartburn.    ? cetirizine (ZYRTEC) 10 MG tablet Take 1 tablet (10 mg total) by mouth daily. 30 tablet 11  ? hydrOXYzine (ATARAX) 10 MG tablet TAKE 1 TABLET(10 MG) BY MOUTH TWICE DAILY AS NEEDED. MAY INCREASE TO 3 TIMES A DAY AFTER 1 WEEK AS NEEDED 90 tablet 0  ? ibuprofen (ADVIL) 600 MG tablet Take 600 mg by mouth  every 6 (six) hours as needed for headache or moderate pain.    ? ipratropium (ATROVENT) 0.03 % nasal spray Place 2 sprays into both nostrils every 12 (twelve) hours. 30 mL 0  ? ?No current facility-administered medications for this visit.  ? ? ?Allergies as of 04/17/2021  ? (No Known Allergies)  ? ? ?Family History  ?Problem Relation Age of Onset  ? COPD Mother   ? Depression Mother   ? Cancer Mother   ?     blood cancer  ? Diabetes Other   ? Colon cancer Neg Hx   ? Esophageal cancer Neg Hx   ? Pancreatic cancer Neg Hx   ? Stomach cancer Neg Hx   ? ? ? ? ?Physical Exam: ?She has lost 4 pounds since her appointment 6/22 ?General:   Alert,  well-nourished, pleasant and cooperative in NAD ?Head:  Normocephalic and atraumatic. ?Abdomen:  Soft, central obesity, nontender, nondistended, normal bowel sounds, no rebound or guarding. No hepatosplenomegaly.   ?Neurologic:  Alert and  oriented x4;  grossly nonfocal ?Skin:  Intact without significant lesions or rashes. ?Psych:  Alert and cooperative. Normal mood and affect. ? ? ? ? ?Lemoine Goyne L. Tarri Glenn, MD, MPH ?04/17/2021, 9:50 AM ? ? ? ?  ?

## 2021-04-18 ENCOUNTER — Ambulatory Visit (HOSPITAL_COMMUNITY): Payer: Medicaid Other | Admitting: Clinical

## 2021-04-18 ENCOUNTER — Encounter (HOSPITAL_COMMUNITY): Payer: Self-pay

## 2021-04-18 ENCOUNTER — Telehealth (HOSPITAL_COMMUNITY): Payer: Self-pay | Admitting: Clinical

## 2021-04-18 NOTE — Telephone Encounter (Signed)
Therapist sent the client a text message link for the scheduled therapy appointment. Client did not respond. Therapist followed up with a tele-phone call to the client. Client was not available.  ?

## 2021-04-19 ENCOUNTER — Other Ambulatory Visit: Payer: Self-pay

## 2021-04-19 ENCOUNTER — Ambulatory Visit (INDEPENDENT_AMBULATORY_CARE_PROVIDER_SITE_OTHER): Payer: Medicaid Other | Admitting: Podiatry

## 2021-04-19 DIAGNOSIS — L600 Ingrowing nail: Secondary | ICD-10-CM

## 2021-04-27 ENCOUNTER — Other Ambulatory Visit: Payer: Self-pay

## 2021-04-27 NOTE — Patient Instructions (Signed)
Visit Information ? ?Katelyn Aguilar was given information about Medicaid Managed Care team care coordination services as a part of their [facility] Medicaid benefit. Katelyn Aguilar verbally consented to engagement with the [facility] Managed Care team.  ? ?If you are experiencing a medical emergency, please call 911 or report to your local emergency department or urgent care.  ? ?If you have a non-emergency medical problem during routine business hours, please contact your provider's office and ask to speak with a nurse.  ? ?For questions related to your [facility] health plan, please call: [phone] or go here:[email or web address] ? ?If you would like to schedule transportation through your [facility] plan, please call the following number at least 2 days in advance of your appointment: [phone]. ? You can also use the MTM portal or MTM mobile app to manage your rides. For the portal, please go to [email or web address] ? ?Call the Behavioral Health Crisis Line at [phone], at any time, 24 hours a day, 7 days a week. If you are in danger or need immediate medical attention call 911. ? ?If you would like help to quit smoking, call 1-800-QUIT-NOW (347-474-7826) OR Espa?ol: 1-855-D?jelo-Ya 807-888-5393) o para m?s informaci?n haga clic aqu? or Text READY to 200-400 to register via text ? ?Ms. Katelyn Aguilar - following are the goals we discussed in your visit today:  Please see Patient Goals in RN Plan of Care below. ? ?Please see education materials related to today's visit provided by MyChart link. ? ?Patient verbalizes understanding of instructions and care plan provided today and agrees to view in MyChart. Active MyChart status confirmed with patient.   ? ?The Managed Medicaid care management team will reach out to the patient again over the next 30 days.  ? ?Virgina Norfolk RN, BSN ?Community Care Coordinator ?[place]  [facility] ?Mobile: [phone]  ? ?Following is a  copy of your plan of care:  ?Care Plan : RN Care Manager Plan of Care  ?Updates made by Leane Call, RN since 04/27/2021 12:00 AM  ?  ? ?Problem: Chronic Disease Management and Care Coordiantion Needs for Gastroparesis and Anxiety with Tics   ?Priority: High  ?  ? ?Long-Range Goal: Development of Plan of Care for Chronic Disease Management and Care Coordination Needs (Gastroparesis, Anxiety)   ?Start Date: 04/27/2021  ?Expected End Date: 08/25/2021  ?Priority: High  ?Note:   ?Current Barriers:  ?Knowledge Deficits related to plan of care for management of Anxiety with Tics and Gastroparesis  ?Care Coordination needs related to Financial constraints related to food stamp reduction, Limited access to food, and Mental Health Concerns   ?Chronic Disease Management support and education needs related to Anxiety with Tics and Gastroparesis ?Corporate treasurer.  ? ?RNCM Clinical Goal(s):  ?Patient will verbalize understanding of plan for management of Anxiety and Gastroparesis as evidenced by improved management of these chronic diseases. ?verbalize basic understanding of Anxiety and Gastroparesis disease process and self health management plan as evidenced by improved management of these chronic diseases. ?take all medications exactly as prescribed and will call provider for medication related questions as evidenced by being compliant with all medications    ?attend all scheduled medical appointments: no scheduled future appointments at this time as evidenced by attending all scheduled medical appointments        ?demonstrate ongoing adherence to prescribed treatment plan for Anxiety and Gastroparesis as evidenced by better management of these diseases. ?continue to work with Medical illustrator and/or Social Worker to address care  management and care coordination needs related to Anxiety and Gastroparesis as evidenced by adherence to CM Team Scheduled appointments     through collaboration with RN Care manager,  provider, and care team.  ? ?Interventions ?Inter-disciplinary care team collaboration (see longitudinal plan of care) ?Evaluation of current treatment plan related to  self management and patient's adherence to plan as established by provider ? ? ? ?Gastroparesis  (Status:  New goal.)  Long Term Goal ?Evaluation of current treatment plan related to  Gastroparesis , Financial constraints related to food stamp reduction, Limited access to food, and Mental Health Concerns  self-management and patient's adherence to plan as established by provider. ?Discussed plans with patient for ongoing care management follow up and provided patient with direct contact information for care management team ?Evaluation of current treatment plan related to Gastroparesis and patient's adherence to plan as established by provider ?Reviewed medications with patient and discussed importance of medication compliance ?Reviewed scheduled/upcoming provider appointments including ?Screening for signs and symptoms of depression related to chronic disease state  ?Assessed social determinant of health barriers ? ? ?Anxiety with Tics  (Status:  New goal.)  Long Term Goal ?Evaluation of current treatment plan related to Anxiety, Financial constraints related to food stamp reduction, Limited access to food, and Mental Health Concerns  self-management and patient's adherence to plan as established by provider. ?Discussed plans with patient for ongoing care management follow up and provided patient with direct contact information for care management team ?Evaluation of current treatment plan related to Anxiety with Tics and patient's adherence to plan as established by provider ?Reviewed medications with patient and discussed importance of medication compliance ?Reviewed scheduled/upcoming provider appointments including ?Advised patient to discuss status of Neurology Referral made in February 2023 with provider ?Screening for signs and symptoms of  depression related to chronic disease state  ?Assessed social determinant of health barriers  ? ?Patient Goals/Self-Care Activities: ?Take medications as prescribed   ?Attend all scheduled provider appointments ?Call pharmacy for medication refills 3-7 days in advance of running out of medications ?Perform all self care activities independently  ?Call provider office for new concerns or questions  ?  ?  ?  ?

## 2021-04-27 NOTE — Patient Outreach (Signed)
?Medicaid Managed Care   ?Nurse Care Manager Note ? ?04/27/2021 ?Name:  Katelyn Aguilar MRN:  [id number] DOB:  12/05/2000 ? ?Katelyn Aguilar is an 21 y.o. year old female who is a primary patient of Katelyn AllanWalsh, Tanya, MD.  [facility] consulted for assistance with:    ?Anxiety ?Gastroparesis ? ?[name] was given information about Medicaid Managed Care Coordination team services today. Katelyn Aguilar Patient agreed to services and verbal consent obtained. ? ?Engaged with patient by telephone for initial visit in response to provider referral for case management and/or care coordination services.  ? ?Assessments/Interventions:  Review of past medical history, allergies, medications, health status, including review of consultants reports, laboratory and other test data, was performed as part of comprehensive evaluation and provision of chronic care management services. ? ?SDOH (Social Determinants of Health) assessments and interventions performed: ?SDOH Interventions   ? ?Flowsheet Row Most Recent Value  ?SDOH Interventions   ?Food Insecurity Interventions Intervention Not Indicated  ?Financial Strain Interventions Intervention Not Indicated  ?Housing Interventions Intervention Not Indicated  ?Physical Activity Interventions Other (Comments)  [Patient states she wants to "work on this" - increase exercise.]  ?Stress Interventions Other (Comment)  [Currently working with a therapist]  ?Social Connections Interventions Intervention Not Indicated  ?Transportation Interventions Intervention Not Indicated  ?Depression Interventions/Treatment  Counseling, Currently on Treatment  ? ?  ? ? ?Care Plan ? ?No Known Allergies ? ?Medications Reviewed Today   ? ? Reviewed by Leane Callavanaugh, Seri Kimmer A, RN (Case Manager) on 04/27/21 at [phone]  Med List Status: <None>  ? ?Medication Order Taking? Sig Documenting Provider Last Dose Status Informant  ?acetaminophen (TYLENOL) 500 MG tablet [id number] Yes Take  1 tablet (500 mg total) by mouth every 6 (six) hours as needed. Derwood KaplanNanavati, Ankit, MD Taking Active   ?albuterol (VENTOLIN HFA) 108 (90 Base) MCG/ACT inhaler [id number] Yes Inhale 1-2 puffs into the lungs every 6 (six) hours as needed for wheezing or shortness of breath. Margaretann LovelessBurnette, Jennifer M, PA-C Taking Active   ?calcium carbonate (TUMS - DOSED IN MG ELEMENTAL CALCIUM) 500 MG chewable tablet [id number] Yes Chew 1 tablet by mouth daily as needed for indigestion or heartburn. [provider] Taking Active Self, Pharmacy Records  ?cetirizine (ZYRTEC) 10 MG tablet [id number] Yes Take 1 tablet (10 mg total) by mouth daily. Katelyn AllanWalsh, Tanya, MD Taking Active Self, Pharmacy Records  ?hydrOXYzine (ATARAX) 10 MG tablet [id number] Yes TAKE 1 TABLET(10 MG) BY MOUTH TWICE DAILY AS NEEDED. MAY INCREASE TO 3 TIMES A DAY AFTER 1 WEEK AS NEEDED Katelyn AllanWalsh, Tanya, MD Taking Active   ?ibuprofen (ADVIL) 600 MG tablet [id number] Yes Take 600 mg by mouth every 6 (six) hours as needed for headache or moderate pain. [provider] Taking Active Self, Pharmacy Records  ?ipratropium (ATROVENT) 0.03 % nasal spray [id number] No Place 2 sprays into both nostrils every 12 (twelve) hours.  ?Patient not taking: Reported on 04/27/2021  ? Margaretann LovelessBurnette, Jennifer M, PA-C Not Taking Active   ? ?  ?  ? ?  ? ? ?Patient Active Problem List  ? Diagnosis Date Noted  ? Cervical cancer screening 11/29/2020  ? Elevated blood pressure reading 11/29/2020  ? Left otitis media 10/27/2020  ? 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 GAD (generalized anxiety disorder) 10/04/2020  ? Ingrown toenail of both feet 08/19/2020  ? Pain of left breast 06/26/2020  ? BMI 60.0-69.9, adult (HCC) 05/19/2020  ? Contraception  management 05/19/2020  ? Anxiety with depression 05/09/2020  ? 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 Tachycardia 02/28/2020  ? Fatigue 02/28/2020  ? Screen for STD (sexually transmitted disease)  01/12/2020  ? Irregular menses 12/04/2019  ? Dizziness 04/22/2019  ? Cramp in limb 04/10/2019  ? Simple tics 11/17/2018  ? Involuntary movements 10/21/2018  ? Encounter for birth control 10/21/2018  ? ? ?Conditions to be addressed/monitored per PCP order:  Anxiety and Gastroparesis ? ?Care Plan : RN Care Manager Plan of Care  ?Updates made by Leane Call, RN since 04/27/2021 12:00 AM  ?  ? ?Problem: Chronic Disease Management and Care Coordiantion Needs for Gastroparesis and Anxiety with Tics   ?Priority: High  ?  ? ?Long-Range Goal: Development of Plan of Care for Chronic Disease Management and Care Coordination Needs (Gastroparesis, Anxiety)   ?Start Date: 04/27/2021  ?Expected End Date: 08/25/2021  ?Priority: High  ?Note:   ?Current Barriers:  ?Knowledge Deficits related to plan of care for management of Anxiety with Tics and Gastroparesis  ?Care Coordination needs related to Financial constraints related to food stamp reduction, Limited access to food, and Mental Health Concerns   ?Chronic Disease Management support and education needs related to Anxiety with Tics and Gastroparesis ?Corporate treasurer.  ? ?RNCM Clinical Goal(s):  ?Patient will verbalize understanding of plan for management of Anxiety and Gastroparesis as evidenced by improved management of these chronic diseases. ?verbalize basic understanding of Anxiety and Gastroparesis disease process and self health management plan as evidenced by improved management of these chronic diseases. ?take all medications exactly as prescribed and will call provider for medication related questions as evidenced by being compliant with all medications    ?attend all scheduled medical appointments: no scheduled future appointments at this time as evidenced by attending all scheduled medical appointments        ?demonstrate ongoing adherence to prescribed treatment plan for Anxiety and Gastroparesis as evidenced by better management of these  diseases. ?continue to work with Medical illustrator and/or Social Worker to address care management and care coordination needs related to Anxiety and Gastroparesis as evidenced by adherence to CM Team Scheduled appointments     through collaboration with RN Care manager, provider, and care team.  ? ?Interventions ?Inter-disciplinary care team collaboration (see longitudinal plan of care) ?Evaluation of current treatment plan related to  self management and patient's adherence to plan as established by provider ? ? ? ?Gastroparesis  (Status:  New goal.)  Long Term Goal ?Evaluation of current treatment plan related to  Gastroparesis , Financial constraints related to food stamp reduction, Limited access to food, and Mental Health Concerns  self-management and patient's adherence to plan as established by provider. ?Discussed plans with patient for ongoing care management follow up and provided patient with direct contact information for care management team ?Evaluation of current treatment plan related to Gastroparesis and patient's adherence to plan as established by provider ?Reviewed medications with patient and discussed importance of medication compliance ?Reviewed scheduled/upcoming provider appointments including ?Screening for signs and symptoms of depression related to chronic disease state  ?Assessed social determinant of health barriers ? ? ?Anxiety with Tics  (Status:  New goal.)  Long Term Goal ?Evaluation of current treatment plan related to Anxiety, Financial constraints related to food stamp reduction, Limited access to food, and Mental Health Concerns  self-management and patient's adherence to plan as established by provider. ?Discussed plans with patient for ongoing care management follow up and provided patient  with direct contact information for care management team ?Evaluation of current treatment plan related to Anxiety with Tics and patient's adherence to plan as established by provider ?Reviewed  medications with patient and discussed importance of medication compliance ?Reviewed scheduled/upcoming provider appointments including ?Advised patient to discuss status of Neurology Referral made in February 2023

## 2021-04-29 DIAGNOSIS — Z419 Encounter for procedure for purposes other than remedying health state, unspecified: Secondary | ICD-10-CM | POA: Diagnosis not present

## 2021-04-30 NOTE — Progress Notes (Signed)
? ?  Subjective: ?21 y.o. female presents today status post permanent nail avulsion procedure of the medial border right great toe that was performed on 04/05/2021.  Patient states that she is doing well.  She did her soaking instructions and has been applying the antibiotic cream as instructed.  No new complaints at this time.  ? ?Past Medical History:  ?Diagnosis Date  ? Allergic rhinitis   ? Anxiety   ? Anxiety 11/17/2018  ? Dysrhythmia   ? tachycardia  ? GERD (gastroesophageal reflux disease)   ? Nausea and vomiting 04/06/2019  ? Obesity   ? Pilonidal abscess 12/04/2019  ? Pilonidal cyst without abscess 04/18/2020  ? Pyelonephritis 09/15/2019  ? ? ?Objective: ?Skin is warm, dry and supple. Nail and respective nail fold appears to be healing appropriately. Open wound to the associated nail fold with a granular wound base and moderate amount of fibrotic tissue. Minimal drainage noted. Mild erythema around the periungual region likely due to phenol chemical matricectomy. ? ?Assessment: ?#1 s/p partial permanent nail matrixectomy medial border right great toe.  04/05/2021 ? ? ?Plan of care: ?#1 patient was evaluated  ?#2 light debridement of open wound was performed to the periungual border of the respective toe using a currette. Antibiotic ointment and Band-Aid was applied. ?#3 patient is to return to clinic on a PRN basis. ? ? ?Felecia Shelling, DPM ?[facility] ? ?Dr. Felecia Shelling, DPM  ?  ?[address]                                      ?[place], [place] [address]                ?Office [phone]  ?Fax [phone] ? ? ? ? ?

## 2021-05-04 ENCOUNTER — Ambulatory Visit (INDEPENDENT_AMBULATORY_CARE_PROVIDER_SITE_OTHER): Payer: Medicaid Other | Admitting: Clinical

## 2021-05-04 ENCOUNTER — Encounter (HOSPITAL_COMMUNITY): Payer: Self-pay

## 2021-05-04 DIAGNOSIS — F331 Major depressive disorder, recurrent, moderate: Secondary | ICD-10-CM
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
## 2021-05-04 NOTE — Plan of Care (Signed)
Client reported she is able to identify cognitive distortions. ?

## 2021-05-12 ENCOUNTER — Ambulatory Visit (INDEPENDENT_AMBULATORY_CARE_PROVIDER_SITE_OTHER): Payer: Medicaid Other | Admitting: Family Medicine

## 2021-05-12 VITALS — BP 122/76 | HR 102 | Ht 63.0 in | Wt 330.2 lb

## 2021-05-12 DIAGNOSIS — Z3040 Encounter for surveillance of contraceptives, unspecified: Secondary | ICD-10-CM | POA: Diagnosis not present

## 2021-05-12 DIAGNOSIS — Z3200 Encounter for pregnancy test, result unknown: Secondary | ICD-10-CM | POA: Diagnosis not present

## 2021-05-12 DIAGNOSIS — R000 Tachycardia, unspecified: Secondary | ICD-10-CM

## 2021-05-12 LAB — POCT URINE PREGNANCY: Preg Test, Ur: NEGATIVE

## 2021-05-12 NOTE — Patient Instructions (Addendum)
It was a pleasure to see you today! ? ?We will get some labs today.  If they are abnormal or we need to do something about them, I will call you.  If they are normal, I will send you a message on MyChart (if it is active) or a letter in the mail.  If you don't hear from [place] in 2 weeks, please call the office  [phone]. ?Neurology referral: [address], [place], [place] [address],    [phone] ?Consider prenatal vitamins to help prevent spinal defects ?Consider using basal temperature to monitor your fertility ? ? ?Be Well, ? ?Dr. Leary Roca ? ?

## 2021-05-13 LAB — BETA HCG QUANT (REF LAB): hCG Quant: <1 m[IU]/mL

## 2021-05-13 NOTE — Assessment & Plan Note (Addendum)
Obtained urine and blood pregnancy tests to help alleviate patient's anxiety. Counseled on birth control, she does not want medication at this time. Counseled on prenatal vitamin, she reports wishing to discuss this with her partner. Since patient not interested in hormonal BC, discussed methods of tracking fertility using basal temperature as one method to assist in both pregnancy and avoiding fertility. ?

## 2021-05-13 NOTE — Assessment & Plan Note (Signed)
Mild tachycardia 100-102, no CP, previously normal EKG. Patient is very anxious today, suspect this is likely cause.  ?

## 2021-05-13 NOTE — Progress Notes (Signed)
? ? ?  SUBJECTIVE:  ? ?CHIEF COMPLAINT / HPI:  ? ?Concern about menstrual cycle: patient reports that she stopped using Depo about a year ago. Her last dose was due in July 2022 and she did not get that. Prior to depo, she had regular periods. She did not have a period while on depo, her first period returned last month and she is 4 days late this month. She has had unprotected sex with her female partner. She is concerned if her period is late about pregnancy. She took two home pregnancy tests that were negative. She has severe anxiety and has tics for which she is being evaluated by neurology. She reports having severe anxiety making her tics and functioning worse about not knowing regarding pregnancy. She reports that she does not want birth control as she wants to become a mother. She is not actively "trying" to become pregnant, but is ok with the idea if it were to happen. We discussed birth control options, prenatal vitamins, plan B. Patient wants a blood test for pregnancy to help calm her anxiety and tics. ? ?PERTINENT  PMH / PSH: non-contributory ? ?OBJECTIVE:  ? ?BP 122/76   Pulse (!) 102   Ht 5\' 3"  (1.6 m)   Wt (!) 330 lb 3.2 oz (149.8 kg)   LMP 04/09/2021   SpO2 98%   BMI 58.49 kg/m?   ?Nursing note and vitals reviewed ?GEN: age-appropriate, 06/09/2021, resting comfortably in chair, NAD, class III obesity ?Cardiac: regular rhythm, fast rate, normal S/S2, no m/r/g, 2+ radial pulses ?Neuro: AOx3  ?Ext: no edema ?Psych: Pleasant, anxious mood, full affect, appropriately dressed and groomed ? ?ASSESSMENT/PLAN:  ? ?Tachycardia ?Mild tachycardia 100-102, no CP, previously normal EKG. Patient is very anxious today, suspect this is likely cause.  ? ?Contraception management ?Obtained urine and blood pregnancy tests to help alleviate patient's anxiety. Counseled on birth control, she does not want medication at this time. Counseled on prenatal vitamin, she reports wishing to discuss this with her partner. Since  patient not interested in hormonal BC, discussed methods of tracking fertility using basal temperature as one method to assist in both pregnancy and avoiding fertility. ?  ? ? ?Clorox [employer], MD ?[facility]  ? ?

## 2021-05-25 ENCOUNTER — Other Ambulatory Visit: Payer: Self-pay

## 2021-05-25 NOTE — Patient Instructions (Signed)
Visit Information ? ?Ms. Lambo was given information about Medicaid Managed Care team care coordination services as a part of their [facility] Medicaid benefit. [name] verbally consented to engagement with the [facility] Managed Care team.  ? ?If you are experiencing a medical emergency, please call 911 or report to your local emergency department or urgent care.  ? ?If you have a non-emergency medical problem during routine business hours, please contact your provider's office and ask to speak with a nurse.  ? ?For questions related to your [facility] health plan, please call: [phone] or go here:[email or web address] ? ?If you would like to schedule transportation through your [facility] plan, please call the following number at least 2 days in advance of your appointment: [phone]. ? You can also use the MTM portal or MTM mobile app to manage your rides. For the portal, please go to [email or web address]. ? ?Call the [place] at [phone], at any time, 24 hours a day, 7 days a week. If you are in danger or need immediate medical attention call 911. ? ?If you would like help to quit smoking, call 1-800-QUIT-NOW 951-716-3039) OR Espa?ol: 1-855-D?jelo-Ya 825-533-7301) o para m?s informaci?n haga clic aqu? or Text READY to 200-400 to register via text ? ?Ms. Alcario Drought - following are the goals we discussed in your visit today:  Please see Patient Goals in the [place] of Care below. ? ?Please see education materials related to today's visit provided by MyChart link. ? ?Patient verbalizes understanding of instructions and care plan provided today and agrees to view in [place]. Active MyChart status confirmed with patient.   ? ?The Managed Medicaid care management team will reach out to the patient again over the next 30 days.  ? ?Salvatore Marvel RN, BSN ?Community Care Coordinator ?[facility] ?Mobile: [phone]   ? ?Following is a copy of your plan of care:  ?Care Plan : [place] of Care  ?Updates made by Inge Rise, RN since 05/25/2021 12:00 AM  ?  ? ?Problem: Chronic Disease Management and Care Coordiantion Needs for Gastroparesis and Anxiety with Tics   ?Priority: High  ?  ? ?Long-Range Goal: Development of Plan of Care for Chronic Disease Management and Care Coordination Needs (Gastroparesis, Anxiety)   ?Start Date: 04/27/2021  ?Expected End Date: 08/25/2021  ?Priority: High  ?Note:   ?Current Barriers:  ?Knowledge Deficits related to plan of care for management of Anxiety with Tics and Gastroparesis  ?Care Coordination needs related to Financial constraints related to food stamp reduction, Limited access to food, and Mental Health Concerns   ?Chronic Disease Management support and education needs related to Anxiety with Tics and Gastroparesis ?Film/video editor.  ? ?RNCM Clinical Goal(s):  ?Patient will verbalize understanding of plan for management of Anxiety and Gastroparesis as evidenced by improved management of these chronic diseases. ?verbalize basic understanding of Anxiety and Gastroparesis disease process and self health management plan as evidenced by improved management of these chronic diseases. ?take all medications exactly as prescribed and will call provider for medication related questions as evidenced by being compliant with all medications    ?attend all scheduled medical appointments: 05/30/2021 [facility] as evidenced by attending all scheduled medical appointments        ?demonstrate ongoing adherence to prescribed treatment plan for Anxiety and Gastroparesis as evidenced by better management of these diseases. ?continue to work with Consulting civil engineer and/or Social Worker to address care  management and care coordination needs related to Anxiety and Gastroparesis as evidenced by adherence to CM Team Scheduled appointments     through collaboration with RN Care  manager, provider, and care team.  ? ?Interventions ?Inter-disciplinary care team collaboration (see longitudinal plan of care) ?Evaluation of current treatment plan related to  self management and patient's adherence to plan as established by provider ? ?Gastroparesis  (Status:  Goal on track:  Yes.)  Long Term Goal ?Evaluation of current treatment plan related to  Gastroparesis , Financial constraints related to food stamp reduction, Limited access to food, and Mental Health Concerns  self-management and patient's adherence to plan as established by provider. ?Discussed plans with patient for ongoing care management follow up and provided patient with direct contact information for care management team ?Evaluation of current treatment plan related to Gastroparesis and patient's adherence to plan as established by provider ?Provided education to patient re: possible side effects of new medication, Amoxicillin.  Educated patient on the most common side effects of an antibiotic are nausea, vomiting and diarrhea.  Patient verbalized understanding. ?Reviewed medications with patient and discussed importance of medication compliance ?Reviewed scheduled/upcoming provider appointments including ?Screening for signs and symptoms of depression related to chronic disease state  ?Assessed social determinant of health barriers ? ? ?Anxiety with Tics  (Status:  Goal on track:  Yes.)  Long Term Goal ?Evaluation of current treatment plan related to Anxiety, Financial constraints related to food stamp reduction, Limited access to food, and Mental Health Concerns  self-management and patient's adherence to plan as established by provider. ?Discussed plans with patient for ongoing care management follow up and provided patient with direct contact information for care management team ?Evaluation of current treatment plan related to Anxiety with Tics and patient's adherence to plan as established by provider ?Advised patient to be an  advocate for herself when meeting with the new [facility] Neurologist next week and inform this neurologist of patient's past experience with previous neurologist of not feeling like she was listened to.   ?Reviewed medications with patient and discussed importance of medication compliance ?Reviewed scheduled/upcoming provider appointments including ?Screening for signs and symptoms of depression related to chronic disease state  ?Assessed social determinant of health barriers  ? ?Patient Goals/Self-Care Activities: ?Take medications as prescribed   ?Attend all scheduled provider appointments ?Call pharmacy for medication refills 3-7 days in advance of running out of medications ?Perform all self care activities independently  ?Call provider office for new concerns or questions  ?  ?  ?  ?

## 2021-05-25 NOTE — Patient Outreach (Signed)
?Medicaid Managed Care   ?Nurse Care Manager Note ? ?05/25/2021 ?Name:  Katelyn Aguilar MRN:  [id number] DOB:  05/13/2000 ? ?Katelyn Aguilar is an 21 y.o. year old female who is a primary patient of Dana AllanWalsh, Tanya, MD.  [facility] for assistance with:    ?Anxiety ?Gastroparesis ? ?[name] was given information about Medicaid Managed Care Coordination team services today. Katelyn Aguilar Patient agreed to services and verbal consent obtained. ? ?Engaged with patient by telephone for follow up visit in response to provider referral for case management and/or care coordination services.  ? ?Assessments/Interventions:  Review of past medical history, allergies, medications, health status, including review of consultants reports, laboratory and other test data, was performed as part of comprehensive evaluation and provision of chronic care management services. ? ?SDOH (Social Determinants of Health) assessments and interventions performed: ? ? ?Care Plan ? ?No Known Allergies ? ?Medications Reviewed Today   ? ? Reviewed by Leane Callavanaugh, Alena Blankenbeckler A, RN (Case Manager) on 05/25/21 at [phone]  Med List Status: <None>  ? ?Medication Order Taking? Sig Documenting Provider Last Dose Status Informant  ?acetaminophen (TYLENOL) 500 MG tablet [id number]  Take 1 tablet (500 mg total) by mouth every 6 (six) hours as needed. Derwood KaplanNanavati, Ankit, MD  Active   ?albuterol (VENTOLIN HFA) 108 (90 Base) MCG/ACT inhaler [id number]  Inhale 1-2 puffs into the lungs every 6 (six) hours as needed for wheezing or shortness of breath. Margaretann LovelessBurnette, Jennifer M, PA-C  Active   ?calcium carbonate (TUMS - DOSED IN MG ELEMENTAL CALCIUM) 500 MG chewable tablet [id number]  Chew 1 tablet by mouth daily as needed for indigestion or heartburn. [provider]  Active Self, Pharmacy Records  ?cetirizine (ZYRTEC) 10 MG tablet [id number]  Take 1 tablet (10 mg total) by mouth daily. Dana AllanWalsh, Tanya, MD  Active Self, Pharmacy  Records  ?hydrOXYzine (ATARAX) 10 MG tablet [id number]  TAKE 1 TABLET(10 MG) BY MOUTH TWICE DAILY AS NEEDED. MAY INCREASE TO 3 TIMES A DAY AFTER 1 WEEK AS NEEDED Dana AllanWalsh, Tanya, MD  Active   ?ibuprofen (ADVIL) 600 MG tablet [id number]  Take 600 mg by mouth every 6 (six) hours as needed for headache or moderate pain. [provider]  Active Self, Pharmacy Records  ?ipratropium (ATROVENT) 0.03 % nasal spray [id number]  Place 2 sprays into both nostrils every 12 (twelve) hours.  ?Patient not taking: Reported on 04/27/2021  ? Margaretann LovelessBurnette, Jennifer M, PA-C  Active   ? ?  ?  ? ?  ? ? ?Patient Active Problem List  ? Diagnosis Date Noted  ? Cervical cancer screening 11/29/2020  ? Elevated blood pressure reading 11/29/2020  ? Left otitis media 10/27/2020  ? 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 GAD (generalized anxiety disorder) 10/04/2020  ? Ingrown toenail of both feet 08/19/2020  ? Pain of left breast 06/26/2020  ? BMI 60.0-69.9, adult (HCC) 05/19/2020  ? Contraception management 05/19/2020  ? Anxiety with depression 05/09/2020  ? 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 Tachycardia 02/28/2020  ? Fatigue 02/28/2020  ? Screen for STD (sexually transmitted disease) 01/12/2020  ? Irregular menses 12/04/2019  ? Dizziness 04/22/2019  ? Cramp in limb 04/10/2019  ? Simple tics 11/17/2018  ? Involuntary movements 10/21/2018  ? Encounter for birth control 10/21/2018  ? ? ?Conditions to be addressed/monitored per PCP order:  Anxiety and Gastroparesis ? ?Care Plan : RN Care Manager Plan of  Care  ?Updates made by Leane Call, RN since 05/25/2021 12:00 AM  ?  ? ?Problem: Chronic Disease Management and Care Coordiantion Needs for Gastroparesis and Anxiety with Tics   ?Priority: High  ?  ? ?Long-Range Goal: Development of Plan of Care for Chronic Disease Management and Care Coordination Needs (Gastroparesis, Anxiety)   ?Start Date: 04/27/2021  ?Expected End Date:  08/25/2021  ?Priority: High  ?Note:   ?Current Barriers:  ?Knowledge Deficits related to plan of care for management of Anxiety with Tics and Gastroparesis  ?Care Coordination needs related to Financial constraints related to food stamp reduction, Limited access to food, and Mental Health Concerns   ?Chronic Disease Management support and education needs related to Anxiety with Tics and Gastroparesis ?Corporate treasurer.  ? ?RNCM Clinical Goal(s):  ?Patient will verbalize understanding of plan for management of Anxiety and Gastroparesis as evidenced by improved management of these chronic diseases. ?verbalize basic understanding of Anxiety and Gastroparesis disease process and self health management plan as evidenced by improved management of these chronic diseases. ?take all medications exactly as prescribed and will call provider for medication related questions as evidenced by being compliant with all medications    ?attend all scheduled medical appointments: 05/30/2021 [facility] as evidenced by attending all scheduled medical appointments        ?demonstrate ongoing adherence to prescribed treatment plan for Anxiety and Gastroparesis as evidenced by better management of these diseases. ?continue to work with Medical illustrator and/or Social Worker to address care management and care coordination needs related to Anxiety and Gastroparesis as evidenced by adherence to CM Team Scheduled appointments     through collaboration with RN Care manager, provider, and care team.  ? ?Interventions ?Inter-disciplinary care team collaboration (see longitudinal plan of care) ?Evaluation of current treatment plan related to  self management and patient's adherence to plan as established by provider ? ?Gastroparesis  (Status:  Goal on track:  Yes.)  Long Term Goal ?Evaluation of current treatment plan related to  Gastroparesis , Financial constraints related to food stamp reduction, Limited access to food, and Mental  Health Concerns  self-management and patient's adherence to plan as established by provider. ?Discussed plans with patient for ongoing care management follow up and provided patient with direct contact information for care management team ?Evaluation of current treatment plan related to Gastroparesis and patient's adherence to plan as established by provider ?Provided education to patient re: possible side effects of new medication, Amoxicillin.  Educated patient on the most common side effects of an antibiotic are nausea, vomiting and diarrhea.  Patient verbalized understanding. ?Reviewed medications with patient and discussed importance of medication compliance ?Reviewed scheduled/upcoming provider appointments including ?Screening for signs and symptoms of depression related to chronic disease state  ?Assessed social determinant of health barriers ? ? ?Anxiety with Tics  (Status:  Goal on track:  Yes.)  Long Term Goal ?Evaluation of current treatment plan related to Anxiety, Financial constraints related to food stamp reduction, Limited access to food, and Mental Health Concerns  self-management and patient's adherence to plan as established by provider. ?Discussed plans with patient for ongoing care management follow up and provided patient with direct contact information for care management team ?Evaluation of current treatment plan related to Anxiety with Tics and patient's adherence to plan as established by provider ?Advised patient to be an advocate for herself when meeting with the new [facility] Neurologist next week and inform this neurologist of patient's past experience with  previous neurologist of not feeling like she was listened to.   ?Reviewed medications with patient and discussed importance of medication compliance ?Reviewed scheduled/upcoming provider appointments including ?Screening for signs and symptoms of depression related to chronic disease state  ?Assessed social determinant of health  barriers  ? ?Patient Goals/Self-Care Activities: ?Take medications as prescribed   ?Attend all scheduled provider appointments ?Call pharmacy for medication refills 3-7 days in advance of running out

## 2021-05-29 DIAGNOSIS — Z419 Encounter for procedure for purposes other than remedying health state, unspecified: Secondary | ICD-10-CM | POA: Diagnosis not present

## 2021-05-30 DIAGNOSIS — F952 Tourette's disorder: Secondary | ICD-10-CM | POA: Diagnosis not present

## 2021-05-30 DIAGNOSIS — Z79899 Other long term (current) drug therapy: Secondary | ICD-10-CM | POA: Diagnosis not present

## 2021-05-30 DIAGNOSIS — F419 Anxiety disorder, unspecified: Secondary | ICD-10-CM | POA: Diagnosis not present

## 2021-05-31 ENCOUNTER — Encounter (HOSPITAL_COMMUNITY): Payer: Self-pay | Admitting: Emergency Medicine

## 2021-05-31 ENCOUNTER — Other Ambulatory Visit: Payer: Self-pay

## 2021-05-31 ENCOUNTER — Emergency Department (HOSPITAL_COMMUNITY): Payer: Medicaid Other

## 2021-05-31 ENCOUNTER — Emergency Department (HOSPITAL_COMMUNITY)
Admission: EM | Admit: 2021-05-31 | Discharge: 2021-05-31 | Disposition: A | Discharge status: Home / Self Care | Payer: Medicaid Other | Attending: Emergency Medicine | Admitting: Emergency Medicine

## 2021-05-31 DIAGNOSIS — R519 Headache, unspecified: Secondary | ICD-10-CM | POA: Diagnosis not present

## 2021-05-31 DIAGNOSIS — Z5321 Procedure and treatment not carried out due to patient leaving prior to being seen by health care provider: Secondary | ICD-10-CM | POA: Diagnosis not present

## 2021-05-31 DIAGNOSIS — R253 Fasciculation: Secondary | ICD-10-CM | POA: Insufficient documentation

## 2021-05-31 DIAGNOSIS — R569 Unspecified convulsions: Secondary | ICD-10-CM | POA: Diagnosis not present

## 2021-05-31 LAB — CBC WITH DIFFERENTIAL/PLATELET
Abs Immature Granulocytes: 0.04 10*3/uL (ref 0.00–0.07)
Basophils Absolute: 0 10*3/uL (ref 0.0–0.1)
Basophils Relative: 0 %
Eosinophils Absolute: 0.3 10*3/uL (ref 0.0–0.5)
Eosinophils Relative: 2 %
HCT: 41.1 % (ref 36.0–46.0)
Hemoglobin: 13.5 g/dL (ref 12.0–15.0)
Immature Granulocytes: 0 %
Lymphocytes Relative: 19 %
Lymphs Abs: 2.1 10*3/uL (ref 0.7–4.0)
MCH: 27.7 pg (ref 26.0–34.0)
MCHC: 32.8 g/dL (ref 30.0–36.0)
MCV: 84.2 fL (ref 80.0–100.0)
Monocytes Absolute: 0.7 10*3/uL (ref 0.1–1.0)
Monocytes Relative: 7 %
Neutro Abs: 7.8 10*3/uL — ABNORMAL HIGH (ref 1.7–7.7)
Neutrophils Relative %: 72 %
Platelets: 317 10*3/uL (ref 150–400)
RBC: 4.88 MIL/uL (ref 3.87–5.11)
RDW: 13.2 % (ref 11.5–15.5)
WBC: 11 10*3/uL — ABNORMAL HIGH (ref 4.0–10.5)
nRBC: 0 % (ref 0.0–0.2)

## 2021-05-31 LAB — BASIC METABOLIC PANEL
Anion gap: 11 (ref 5–15)
BUN: 10 mg/dL (ref 6–20)
CO2: 21 mmol/L — ABNORMAL LOW (ref 22–32)
Calcium: 9.1 mg/dL (ref 8.9–10.3)
Chloride: 107 mmol/L (ref 98–111)
Creatinine, Ser: 0.68 mg/dL (ref 0.44–1.00)
GFR, Estimated: >60 mL/min (ref >60)
Glucose, Bld: 94 mg/dL (ref 70–99)
Potassium: 3.8 mmol/L (ref 3.5–5.1)
Sodium: 139 mmol/L (ref 135–145)

## 2021-05-31 LAB — MAGNESIUM: Magnesium: 2.1 mg/dL (ref 1.7–2.4)

## 2021-05-31 NOTE — ED Notes (Signed)
Patient states she will follow up with primary care in the morning ?

## 2021-05-31 NOTE — ED Triage Notes (Signed)
Pt reports no hx of seizures.  States she does have various "tics" that she has experienced in the past. Tonight felt like she had "full body twitching" and intermittent control of her right arm.  She was worried this was a seizure. No loss of consciousness. Aware during entire event.  Not confused afterward.  No incontinence of bowel or bladder.   ?

## 2021-05-31 NOTE — ED Provider Triage Note (Signed)
Emergency Medicine Provider Triage Evaluation Note ? ?Katelyn Aguilar , a 21 y.o. female  was evaluated in triage.  Pt complains of concern of seizure.  Patient with no prior history of seizures.  She does have history of tics for which she follows with neurology most recently seen 05/30/2021.  States just prior to arrival when she was lying in bed she has generalized tics.  She states she was fully aware of the event however was unable to speak.  She states she had intermittent control and was able to tap on her fianc? to wake him up.  She states it lasted about 30 seconds.  States following the incident she had some brain fog and was unable to think clearly.  She denies recent illness, fever, chills and does not take anything for tics at this time. ? ?Review of Systems  ?Positive: As above ?Negative: As above ? ?Physical Exam  ?BP (!) 151/85 (BP Location: Left Arm)   Pulse 83   Resp (!) 28   SpO2 100%  ?Gen:   Awake, no distress   ?Resp:  Normal effort  ?MSK:   Moves extremities without difficulty  ?Other:  Without focal neurological deficits.  Cranial nerves III through XII intact.  Intact strength and upper and lower extremities.  Sensation intact. ? ?Medical Decision Making  ?Medically screening exam initiated at 1:17 AM.  Appropriate orders placed.  Katelyn Aguilar was informed that the remainder of the evaluation will be completed by another provider, this initial triage assessment does not replace that evaluation, and the importance of remaining in the ED until their evaluation is complete. ? ? ?  ?Marita [place], PA-C ?05/31/21 0120 ? ?

## 2021-06-06 DIAGNOSIS — R4689 Other symptoms and signs involving appearance and behavior: Secondary | ICD-10-CM | POA: Diagnosis not present

## 2021-06-08 ENCOUNTER — Ambulatory Visit (INDEPENDENT_AMBULATORY_CARE_PROVIDER_SITE_OTHER): Payer: Medicaid Other | Admitting: Psychiatry

## 2021-06-08 DIAGNOSIS — F332 Major depressive disorder, recurrent severe without psychotic features: Secondary | ICD-10-CM | POA: Diagnosis not present

## 2021-06-08 DIAGNOSIS — F411 Generalized anxiety disorder: Secondary | ICD-10-CM | POA: Diagnosis not present

## 2021-06-08 MED ORDER — BUPROPION HCL ER (XL) 150 MG PO TB24: 150.0000 mg | ORAL_TABLET | ORAL | 1 refills | Status: DC

## 2021-06-08 MED ORDER — HYDROXYZINE HCL 25 MG PO TABS
25.0000 mg | ORAL_TABLET | Freq: Three times a day (TID) | ORAL | 1 refills | Status: DC | PRN
Start: 2021-06-08 — End: 2021-07-24
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
## 2021-06-12 ENCOUNTER — Telehealth (HOSPITAL_COMMUNITY): Payer: Self-pay | Admitting: *Deleted

## 2021-06-12 NOTE — Telephone Encounter (Signed)
Patient called stating that she started 2 medications at the same time and she is has been very dizzy. She is not sure which medication is causing the dizziness. Started Propanolol and Wellbutrin. Wanting to know if she should stop the Wellbutrin until she talks to her neurologist who prescribed the Propanolol for headaches. Message sent to MD. ?

## 2021-06-19 ENCOUNTER — Ambulatory Visit: Payer: Medicaid Other

## 2021-06-19 ENCOUNTER — Ambulatory Visit (INDEPENDENT_AMBULATORY_CARE_PROVIDER_SITE_OTHER): Payer: Medicaid Other | Admitting: Family Medicine

## 2021-06-19 VITALS — BP 134/89 | HR 110 | Ht 63.0 in | Wt 334.0 lb

## 2021-06-19 DIAGNOSIS — J309 Allergic rhinitis, unspecified: Secondary | ICD-10-CM | POA: Diagnosis not present

## 2021-06-19 MED ORDER — FLUTICASONE PROPIONATE 50 MCG/ACT NA SUSP: 2.0000 | Freq: Every day | NASAL | 0 refills | Status: AC

## 2021-06-19 MED ORDER — LEVOCETIRIZINE DIHYDROCHLORIDE 5 MG PO TABS: 5.0000 mg | ORAL_TABLET | Freq: Every evening | ORAL | 1 refills | Status: AC

## 2021-06-19 NOTE — Progress Notes (Unsigned)
    SUBJECTIVE:   CHIEF COMPLAINT / HPI: Allergies acting up  Worsening seasonal allergies x 3-4 days. Has been using Ipratropium but feels like this has been drying up too much. Taking Zyrtec daily without relied.  Denies any fevers, cough, shortness of breath or recent sick contacts. Endorse nasal congestions and runny nose. Lives with cats and dog.    PERTINENT  PMH / PSH:  Seasonal allergies Tachycardia Obesity class 3   OBJECTIVE:   BP 134/89   Pulse (!) 110   Ht 5\' 3"  (1.6 m)   Wt (!) 334 lb (151.5 kg)   LMP 06/17/2021   SpO2 98%   BMI 59.17 kg/m    General: Alert, no acute distress HEENT: TM's wnl,  MMM, no lymphadenopathy.  Nasal congestion, turbinates pale and swollen Cardio: Normal S1 and S2, RRR, no r/m/g Pulm: CTAB, normal work of breathing  ASSESSMENT/PLAN:   Allergic rhinitis Stop Ipratropium Start Flonase 1 spray to each naris BID Stop Zyrtec Start Levocetirizine 5 mg BID Could consider short course of Afrin if no improvement Continue Humidified air Avoid allergens Follow up with PCP if no improvement in symptoms      Carollee Leitz, MD [place]

## 2021-06-19 NOTE — Patient Instructions (Signed)
Thank you for coming to see me today. It was a pleasure. Today we talked about:   Stop Zyrtec and Benadryl Start levocetirizine 5 mg at night  Stop ipratropium nasal spray Start Flonase 1 spray to each nostril twice a day, or 2 sprays to each nostril once a day.  Humidifier at night Avoid allergens   Please follow-up with PCP as needed or if symptoms worsen.  If you have any questions or concerns, please do not hesitate to call the office at [phone].  Best,   Dana Allan, MD      Allergic Rhinitis, Adult Allergic rhinitis is a reaction to allergens. Allergens are things that can cause an allergic reaction. This condition affects the lining inside the nose (mucous membrane). There are two types of allergic rhinitis: Seasonal. This type is also called hay fever. It happens only during some times of the year. Perennial. This type can happen at any time of the year. This condition cannot be spread from person to person (is not contagious). It can be mild, worse, or very bad. It can develop at any age and may be outgrown. What are the causes? This condition may be caused by: Pollen from grasses, trees, and weeds. Dust mites. Smoke. Mold. Car fumes. The pee (urine), spit, or dander of pets. Dander is dead skin cells from a pet. What increases the risk? You are more likely to develop this condition if: You have allergies in your family. You have problems like allergies in your family. You may have: Swelling of parts of your eyes and eyelids. Asthma. This affects how you breathe. Long-term redness and swelling on your skin. Food allergies. What are the signs or symptoms? The main symptom of this condition is a runny or stuffy nose (nasal congestion). Other symptoms may include: Sneezing or coughing. Itching and tearing of your eyes. Mucus that drips down the back of your throat (postnasal drip). Trouble sleeping. Feeling tired. Headache. Sore throat. How is this  treated? There is no cure for this condition. You should avoid things that you are allergic to. Treatment can help to relieve symptoms. This may include: Medicines that block allergy symptoms, such as corticosteroids or antihistamines. These may be given as a shot, nasal spray, or pill. Avoiding things you are allergic to. Medicines that give you bits of what you are allergic to over time. This is called immunotherapy. It is done if other treatments do not help. You may get: Shots. Medicine under your tongue. Stronger medicines, if other treatments do not help. Follow these instructions at home: Avoiding allergens Find out what things you are allergic to and avoid them. To do this, try these things: If you get allergies any time of year: Replace carpet with wood, tile, or vinyl flooring. Carpet can trap pet dander and dust. Do not smoke. Do not allow smoking in your home. Change your heating and air conditioning filters at least once a month. If you get allergies only some times of the year: Keep windows closed when you can. Plan things to do outside when pollen counts are lowest. Check pollen counts before you plan things to do outside. When you come indoors, change your clothes and shower before you sit on furniture or bedding. If you are allergic to a pet: Keep the pet out of your bedroom. Vacuum, sweep, and dust often.  General instructions Take over-the-counter and prescription medicines only as told by your doctor. Drink enough fluid to keep your pee (urine) pale yellow.  Keep all follow-up visits as told by your doctor. This is important. Where to find more information American Academy of Allergy, Asthma & Immunology: www.aaaai.org Contact a doctor if: You have a fever. You get a cough that does not go away. You make whistling sounds when you breathe (wheeze). Your symptoms slow you down. Your symptoms stop you from doing your normal things each day. Get help right away  if: You are short of breath. This symptom may be an emergency. Do not wait to see if the symptom will go away. Get medical help right away. Call your local emergency services (911 in the [place] Do not drive yourself to the hospital. Summary Allergic rhinitis may be treated by taking medicines and avoiding things you are allergic to. If you have allergies only some of the year, keep windows closed when you can at those times. Contact your doctor if you get a fever or a cough that does not go away. This information is not intended to replace advice given to you by your health care provider. Make sure you discuss any questions you have with your health care provider. Document Revised: 03/09/2019 Document Reviewed: 01/13/2019 Elsevier Patient Education  2023 [employer].

## 2021-06-20 ENCOUNTER — Encounter: Payer: Self-pay | Admitting: Family Medicine

## 2021-06-21 ENCOUNTER — Other Ambulatory Visit: Payer: Self-pay

## 2021-06-21 NOTE — Patient Outreach (Signed)
Medicaid Managed Care   Nurse Care Manager Note  06/21/2021 Name:  Katelyn Aguilar MRN:  [id number] DOB:  03-07-2000  Katelyn Aguilar is an 21 y.o. year old female who is a primary patient of Dana Allan, MD.  The [facility] Managed Care Coordination team was consulted for assistance with:    Anxiety Gastroparesis  Ms. Delafuente was given information about Medicaid Managed Care Coordination team services today. Katelyn Aguilar Patient agreed to services and verbal consent obtained.  Engaged with patient by telephone for follow up visit in response to provider referral for case management and/or care coordination services.   Assessments/Interventions:  Review of past medical history, allergies, medications, health status, including review of consultants reports, laboratory and other test data, was performed as part of comprehensive evaluation and provision of chronic care management services.  SDOH (Social Determinants of Health) assessments and interventions performed:   Care Plan  No Known Allergies  Medications Reviewed Today     Reviewed by Leane Call, RN (Case Manager) on 06/21/21 at 1330  Med List Status: <None>   Medication Order Taking? Sig Documenting Provider Last Dose Status Informant  acetaminophen (TYLENOL) 500 MG tablet [id number] No Take 1 tablet (500 mg total) by mouth every 6 (six) hours as needed. Derwood Kaplan, MD Taking Active   albuterol (VENTOLIN HFA) 108 (90 Base) MCG/ACT inhaler [id number] No Inhale 1-2 puffs into the lungs every 6 (six) hours as needed for wheezing or shortness of breath. Joycelyn Man M, PA-C Taking Active   buPROPion (WELLBUTRIN XL) 150 MG 24 hr tablet [id number]  Take 1 tablet (150 mg total) by mouth every morning. Penn, [place] Neighbor, NP  Active   calcium carbonate (TUMS - DOSED IN MG ELEMENTAL CALCIUM) 500 MG chewable tablet [id number] No Chew 1 tablet by mouth daily as needed for indigestion or heartburn. [provider]  Taking Active Self, Pharmacy Records  fluticasone [facility]) 50 MCG/ACT nasal spray [id number]  Place 2 sprays into both nostrils daily. Dana Allan, MD  Active   hydrOXYzine (ATARAX) 25 MG tablet [id number]  Take 1 tablet (25 mg total) by mouth 3 (three) times daily as needed for anxiety. Mcneil Sober, NP  Active   ibuprofen (ADVIL) 600 MG tablet [id number] No Take 600 mg by mouth every 6 (six) hours as needed for headache or moderate pain. [provider] Taking Active Self, Pharmacy Records  levocetirizine (XYZAL) 5 MG tablet [id number]  Take 1 tablet (5 mg total) by mouth every evening. Dana Allan, MD  Active             Patient Active Problem List   Diagnosis Date Noted   Cervical cancer screening 11/29/2020   Elevated blood pressure reading 11/29/2020   Left otitis media 10/27/2020   Severe episode of recurrent major depressive disorder, 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   GAD (generalized anxiety disorder) 10/04/2020   Ingrown toenail of both feet 08/19/2020   Pain of left breast 06/26/2020   BMI 60.0-69.9, adult (HCC) 05/19/2020   Contraception management 05/19/2020   Anxiety with depression 05/09/2020   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/26/2020   Tachycardia 02/28/2020   Fatigue 02/28/2020   Screen for STD (sexually transmitted disease) 01/12/2020   Irregular menses 12/04/2019   Dizziness 04/22/2019   Cramp in limb 04/10/2019   Simple tics 11/17/2018   Involuntary movements 10/21/2018   Encounter for birth control 10/21/2018    Conditions to be addressed/monitored per PCP order:  Anxiety and Gastroparesis  Care  Plan : RN Care Manager Plan of Care  Updates made by Leane Callavanaugh, Lacee Grey A, RN since 06/21/2021 12:00 AM     Problem: Chronic Disease Management and Care Coordiantion Needs for Gastroparesis and Anxiety with Tics   Priority: High     Long-Range Goal: Development of Plan of Care for Chronic Disease Management and Care Coordination Needs  (Gastroparesis, Anxiety)   Start Date: 04/27/2021  Expected End Date: 08/25/2021  Priority: High  Note:   Current Barriers:  Knowledge Deficits related to plan of care for management of Anxiety with Tics and Gastroparesis  Care Coordination needs related to Financial constraints related to food stamp reduction, Limited access to food, and Mental Health Concerns   Chronic Disease Management support and education needs related to Anxiety with Tics and Gastroparesis Financial Constraints.   RNCM Clinical Goal(s):  Patient will verbalize understanding of plan for management of Anxiety and Gastroparesis as evidenced by improved management of these chronic diseases. verbalize basic understanding of Anxiety and Gastroparesis disease process and self health management plan as evidenced by improved management of these chronic diseases. take all medications exactly as prescribed and will call provider for medication related questions as evidenced by being compliant with all medications    attend all scheduled medical appointments: No scheduled appointments at this time as evidenced by attending all scheduled medical appointments        demonstrate ongoing adherence to prescribed treatment plan for Anxiety and Gastroparesis as evidenced by better management of these diseases. continue to work with Medical illustratorN Care Manager and/or Social Worker to address care management and care coordination needs related to Anxiety and Gastroparesis as evidenced by adherence to CM Team Scheduled appointments    through collaboration with Medical illustratorN Care manager, provider, and care team.   Interventions Inter-disciplinary care team collaboration (see longitudinal plan of care) Evaluation of current treatment plan related to  self management and patient's adherence to plan as established by provider  Gastroparesis  (Status:  Goal on track:  Yes.)  Long Term Goal Evaluation of current treatment plan related to  Gastroparesis , Financial  constraints related to food stamp reduction, Limited access to food, and Mental Health Concerns  self-management and patient's adherence to plan as established by provider. Discussed plans with patient for ongoing care management follow up and provided patient with direct contact information for care management team Evaluation of current treatment plan related to Gastroparesis and patient's adherence to plan as established by provider Reviewed medications with patient and discussed importance of medication compliance Reviewed scheduled/upcoming provider appointments including: No scheduled appointments at this time. Patient states gastroparesis flare ups "have not been bad" lately because of decreased appetite due to congestion caused by seasonal allergies. Patient's nasal and chest congestion evident when speaking to her on the telephone during visit.  New medications prescribed to help relieve allergy symptoms.  Flonase and Levocetirizine 5 mg tablet at bedtime has caused worse nightmares.  This RN Care Manager instructed patient to take Flonase during the day instead of at night.  Patient agreed and was planning on trying that. Patient asked for assistance with obtaining a humidifier since she cannot afford one at this time.  This RN Care Manager plans to reach out to BSW team member and learn of Medicaid insurance or any community resource is available to assist with patient getting a humidifier.    Anxiety with Tics  (Status:  Goal on track:  Yes.)  Long Term Goal Evaluation of current treatment plan related to  Anxiety, Financial constraints related to food stamp reduction, Limited access to food, and Mental Health Concerns  self-management and patient's adherence to plan as established by provider. Discussed plans with patient for ongoing care management follow up and provided patient with direct contact information for care management team Evaluation of current treatment plan related to Anxiety  with Tics and patient's adherence to plan as established by provider Advised patient to be an advocate for herself when meeting with the new [facility] Neurologist next week and inform this neurologist of patient's past experience with previous neurologist of not feeling like she was listened to.   Reviewed medications with patient and discussed importance of medication compliance Reviewed scheduled/upcoming provider appointments including Patient did attend Neurologist appointment on 05/30/2021 for evaluation.  EEG was ordered and performed to rule out seizures.  EEG was negative for seizures and patient relieved to hear this result.  Patient being pro-active to prevent full blown episodes of anxiety with tics when they occur, example: when patient feels lightheadedness starting, she was stop and slow down what she is doing to prevent the episode from progressing.   Patient informed this RN Care Manager that she is working on [employer].  Patient does not get stressed over it but has just forgotten to complete the renewal a couple of times lately.  Patient Goals/Self-Care Activities: Take medications as prescribed   Attend all scheduled provider appointments Call pharmacy for medication refills 3-7 days in advance of running out of medications Perform all self care activities independently  Call provider office for new concerns or questions        Follow Up:  Patient agrees to Care Plan and Follow-up.  Plan: The Managed Medicaid care management team will reach out to the patient again over the next 40 days.  Date/time of next scheduled RN care management/care coordination outreach:  July 26, 2021 at 1:00 PM  Virgina Norfolk RN, BSN Community Care Coordinator [facility]  [facility] Mobile: [phone]

## 2021-06-21 NOTE — Patient Instructions (Signed)
Visit Information  Ms. Goldtooth was given information about Medicaid Managed Care team care coordination services as a part of their [facility] Medicaid benefit. [name] verbally consented to engagement with the [facility] Managed Care team.   If you are experiencing a medical emergency, please call 911 or report to your local emergency department or urgent care.   If you have a non-emergency medical problem during routine business hours, please contact your provider's office and ask to speak with a nurse.   For questions related to your [facility] health plan, please call: [phone] or go here:[email or web address]  If you would like to schedule transportation through your [facility] plan, please call the following number at least 2 days in advance of your appointment: [phone].  You can also use the MTM portal or MTM mobile app to manage your rides. For the portal, please go to [email or web address].  Call the [place] at [phone], at any time, 24 hours a day, 7 days a week. If you are in danger or need immediate medical attention call 911.  If you would like help to quit smoking, call 1-800-QUIT-NOW 772-828-7714) OR Espaol: 1-855-Djelo-Ya HD:1601594) o para ms informacin haga clic aqu or Text READY to 200-400 to register via text  Ms. Bullough - following are the goals we discussed in your visit today:  Please see Patient Goals in the [place] of Care below.  Please see education materials related to today's visit provided by MyChart link.  Patient verbalizes understanding of instructions and care plan provided today and agrees to view in [place]. Active MyChart status and patient understanding of how to access instructions and care plan via MyChart confirmed with patient.     The Managed Medicaid care management team will reach out to the patient again over the next 40 days.   Salvatore Marvel RN,  BSN Community Care Coordinator [place] [facility] Mobile: [phone]   Following is a copy of your plan of care:  Care Plan : [place] of Care  Updates made by Inge Rise, RN since 06/21/2021 12:00 AM     Problem: Chronic Disease Management and Care Coordiantion Needs for Gastroparesis and Anxiety with Tics   Priority: High     Long-Range Goal: Development of Plan of Care for Chronic Disease Management and Care Coordination Needs (Gastroparesis, Anxiety)   Start Date: 04/27/2021  Expected End Date: 08/25/2021  Priority: High  Note:   Current Barriers:  Knowledge Deficits related to plan of care for management of Anxiety with Tics and Gastroparesis  Care Coordination needs related to Financial constraints related to food stamp reduction, Limited access to food, and Mental Health Concerns   Chronic Disease Management support and education needs related to Anxiety with Tics and Gastroparesis Financial Constraints.   RNCM Clinical Goal(s):  Patient will verbalize understanding of plan for management of Anxiety and Gastroparesis as evidenced by improved management of these chronic diseases. verbalize basic understanding of Anxiety and Gastroparesis disease process and self health management plan as evidenced by improved management of these chronic diseases. take all medications exactly as prescribed and will call provider for medication related questions as evidenced by being compliant with all medications    attend all scheduled medical appointments: No scheduled appointments at this time as evidenced by attending all scheduled medical appointments        demonstrate ongoing adherence to prescribed treatment plan for Anxiety and Gastroparesis as evidenced by better management  of these diseases. continue to work with Consulting civil engineer and/or Social Worker to address care management and care coordination needs related to Anxiety and Gastroparesis as  evidenced by adherence to CM Team Scheduled appointments    through collaboration with Consulting civil engineer, provider, and care team.   Interventions Inter-disciplinary care team collaboration (see longitudinal plan of care) Evaluation of current treatment plan related to  self management and patient's adherence to plan as established by provider  Gastroparesis  (Status:  Goal on track:  Yes.)  Long Term Goal Evaluation of current treatment plan related to  Gastroparesis , Financial constraints related to food stamp reduction, Limited access to food, and Mental Health Concerns  self-management and patient's adherence to plan as established by provider. Discussed plans with patient for ongoing care management follow up and provided patient with direct contact information for care management team Evaluation of current treatment plan related to Gastroparesis and patient's adherence to plan as established by provider Reviewed medications with patient and discussed importance of medication compliance Reviewed scheduled/upcoming provider appointments including: No scheduled appointments at this time. Patient states gastroparesis flare ups "have not been bad" lately because of decreased appetite due to congestion caused by seasonal allergies. Patient's nasal and chest congestion evident when speaking to her on the telephone during visit.  New medications prescribed to help relieve allergy symptoms.  Flonase and Levocetirizine 5 mg tablet at bedtime has caused worse nightmares.  This RN Care Manager instructed patient to take Flonase during the day instead of at night.  Patient agreed and was planning on trying that. Patient asked for assistance with obtaining a humidifier since she cannot afford one at this time.  This RN Care Manager plans to reach out to [place] team member and learn of Medicaid insurance or any community resource is available to assist with patient getting a humidifier.    Anxiety with Tics   (Status:  Goal on track:  Yes.)  Long Term Goal Evaluation of current treatment plan related to Anxiety, Financial constraints related to food stamp reduction, Limited access to food, and Mental Health Concerns  self-management and patient's adherence to plan as established by provider. Discussed plans with patient for ongoing care management follow up and provided patient with direct contact information for care management team Evaluation of current treatment plan related to Anxiety with Tics and patient's adherence to plan as established by provider Advised patient to be an advocate for herself when meeting with the new [facility] Neurologist next week and inform this neurologist of patient's past experience with previous neurologist of not feeling like she was listened to.   Reviewed medications with patient and discussed importance of medication compliance Reviewed scheduled/upcoming provider appointments including Patient did attend Neurologist appointment on 05/30/2021 for evaluation.  EEG was ordered and performed to rule out seizures.  EEG was negative for seizures and patient relieved to hear this result.  Patient being pro-active to prevent full blown episodes of anxiety with tics when they occur, example: when patient feels lightheadedness starting, she was stop and slow down what she is doing to prevent the episode from progressing.   Patient informed this RN Care Manager that she is working on [employer].  Patient does not get stressed over it but has just forgotten to complete the renewal a couple of times lately.  Patient Goals/Self-Care Activities: Take medications as prescribed   Attend all scheduled provider appointments Call pharmacy for medication refills 3-7 days in advance of running  out of medications Perform all self care activities independently  Call provider office for new concerns or questions

## 2021-06-22 ENCOUNTER — Encounter: Payer: Self-pay | Admitting: Family Medicine

## 2021-06-22 DIAGNOSIS — J309 Allergic rhinitis, unspecified: Secondary | ICD-10-CM | POA: Insufficient documentation

## 2021-06-22 NOTE — Assessment & Plan Note (Signed)
Stop Ipratropium Start Flonase 1 spray to each naris BID Stop Zyrtec Start Levocetirizine 5 mg BID Could consider short course of Afrin if no improvement Continue Humidified air Avoid allergens Follow up with PCP if no improvement in symptoms

## 2021-06-29 DIAGNOSIS — Z419 Encounter for procedure for purposes other than remedying health state, unspecified: Secondary | ICD-10-CM | POA: Diagnosis not present

## 2021-07-04 ENCOUNTER — Encounter: Payer: Self-pay | Admitting: *Deleted

## 2021-07-24 ENCOUNTER — Encounter (HOSPITAL_COMMUNITY): Payer: Self-pay | Admitting: Psychiatry

## 2021-07-24 ENCOUNTER — Ambulatory Visit (INDEPENDENT_AMBULATORY_CARE_PROVIDER_SITE_OTHER): Payer: Medicaid Other | Admitting: Clinical

## 2021-07-24 ENCOUNTER — Telehealth (INDEPENDENT_AMBULATORY_CARE_PROVIDER_SITE_OTHER): Payer: Medicaid Other | Admitting: Psychiatry

## 2021-07-24 DIAGNOSIS — F411 Generalized anxiety disorder: Secondary | ICD-10-CM

## 2021-07-24 DIAGNOSIS — F331 Major depressive disorder, recurrent, moderate: Secondary | ICD-10-CM

## 2021-07-24 MED ORDER — HYDROXYZINE HCL 25 MG PO TABS: 25.0000 mg | ORAL_TABLET | Freq: Three times a day (TID) | ORAL | 3 refills | Status: AC | PRN

## 2021-07-24 MED ORDER — BUPROPION HCL ER (XL) 150 MG PO TB24: 150.0000 mg | ORAL_TABLET | ORAL | 3 refills | Status: AC

## 2021-07-26 ENCOUNTER — Ambulatory Visit: Payer: Self-pay

## 2021-07-29 DIAGNOSIS — Z419 Encounter for procedure for purposes other than remedying health state, unspecified: Secondary | ICD-10-CM | POA: Diagnosis not present

## 2021-07-31 ENCOUNTER — Other Ambulatory Visit: Payer: Self-pay | Admitting: Obstetrics and Gynecology

## 2021-07-31 NOTE — Patient Outreach (Signed)
Medicaid Managed Care   Nurse Care Manager Note  07/31/2021 Name:  Katelyn Aguilar MRN:  [id number] DOB:  Aug 25, 2000  Katelyn Aguilar is an 21 y.o. year old female who is a primary patient of Katelyn Dash, DO.  The [facility] Managed Care Coordination team was consulted for assistance with:    Healthcare management needs, gastroparesis, anxiety, depression, tics  Katelyn Aguilar was given information about Medicaid Managed Care Coordination team services today. Katelyn Aguilar Patient agreed to services and verbal consent obtained.  Engaged with patient by telephone for follow up visit in response to provider referral for case management and/or care coordination services.   Assessments/Interventions:  Review of past medical history, allergies, medications, health status, including review of consultants reports, laboratory and other test data, was performed as part of comprehensive evaluation and provision of chronic care management services.  SDOH (Social Determinants of Health) assessments and interventions performed:  Care Plan  No Known Allergies  Medications Reviewed Today     Reviewed by Danie Chandler, RN (Registered Nurse) on 07/31/21 at 1339  Med List Status: <None>   Medication Order Taking? Sig Documenting Provider Last Dose Status Informant  acetaminophen (TYLENOL) 500 MG tablet [id number] No Take 1 tablet (500 mg total) by mouth every 6 (six) hours as needed. Derwood Kaplan, MD Taking Active   albuterol (VENTOLIN HFA) 108 (90 Base) MCG/ACT inhaler [id number] No Inhale 1-2 puffs into the lungs every 6 (six) hours as needed for wheezing or shortness of breath. Joycelyn Man M, PA-C Taking Active   buPROPion (WELLBUTRIN XL) 150 MG 24 hr tablet [id number]  Take 1 tablet (150 mg total) by mouth every morning. Shanna Cisco, NP  Active   calcium carbonate (TUMS - DOSED IN MG ELEMENTAL CALCIUM) 500 MG chewable tablet [id number] No Chew 1 tablet by mouth daily as needed for  indigestion or heartburn. [provider] Taking Active Self, Pharmacy Records  fluticasone [facility]) 50 MCG/ACT nasal spray [id number]  Place 2 sprays into both nostrils daily. Dana Allan, MD  Active   hydrOXYzine (ATARAX) 25 MG tablet [id number]  Take 1 tablet (25 mg total) by mouth 3 (three) times daily as needed for anxiety. Shanna Cisco, NP  Active   ibuprofen (ADVIL) 600 MG tablet [id number] No Take 600 mg by mouth every 6 (six) hours as needed for headache or moderate pain. [provider] Taking Active Self, Pharmacy Records  levocetirizine (XYZAL) 5 MG tablet [id number]  Take 1 tablet (5 mg total) by mouth every evening. Dana Allan, MD  Active            Patient Active Problem List   Diagnosis Date Noted   Allergic rhinitis 06/22/2021   Cervical cancer screening 11/29/2020   Elevated blood pressure reading 11/29/2020   Left otitis media 10/27/2020   Severe episode of recurrent major depressive disorder, 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   GAD (generalized anxiety disorder) 10/04/2020   Ingrown toenail of both feet 08/19/2020   Pain of left breast 06/26/2020   BMI 60.0-69.9, adult (HCC) 05/19/2020   Contraception management 05/19/2020   Anxiety with depression 05/09/2020   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/26/2020   Tachycardia 02/28/2020   Fatigue 02/28/2020   Screen for STD (sexually transmitted disease) 01/12/2020   Irregular menses 12/04/2019   Dizziness 04/22/2019   Cramp in limb 04/10/2019   Simple tics 11/17/2018   Involuntary movements 10/21/2018   Encounter for birth control 10/21/2018   Conditions to be addressed/monitored  per PCP order:  Healthcare management needs, gastroparesis, anxiety, depression, tics  Care Plan : RN Care Manager Plan of Care  Updates made by Danie Chandler, RN since 07/31/2021 12:00 AM     Problem: Chronic Disease Management and Care Coordiantion Needs for Gastroparesis and Anxiety with Tics    Priority: High     Goal: Establish Plan of Care for Chronic Disease Management Needs   Priority: High  Note:   Timeframe:  Long-Range Goal Priority:  High Start Date:   07/31/21                          Expected End Date:   ongoing                    Follow Up Date 09/06/21    - practice safe sex - schedule appointment for vaccines needed due to my age or health - schedule recommended health tests (blood work, mammogram, colonoscopy, pap test) - schedule and keep appointment for annual check-up    Why is this important?   Screening tests can find diseases early when they are easier to treat.  Your doctor or nurse will talk with you about which tests are important for you.  Getting shots for common diseases like the flu and shingles will help prevent them.      Long-Range Goal: Development of Plan of Care for Chronic Disease Management and Care Coordination Needs (Gastroparesis, Anxiety)   Start Date: 04/27/2021  Expected End Date: 10/31/2021  Priority: High  Note:   Current Barriers:  Knowledge Deficits related to plan of care for management of Anxiety with Tics and Gastroparesis  Care Coordination needs related to Financial constraints related to food stamp reduction, Limited access to food, and Mental Health Concerns   Chronic Disease Management support and education needs related to Anxiety with Tics and Gastroparesis Financial Constraints.  07/31/21:  Patient with recent increase in tics-to schedule an appt with neuro, continues to see Katelyn Aguilar for therapy  RNCM Clinical Goal(s):  Patient will verbalize understanding of plan for management of Anxiety and Gastroparesis as evidenced by improved management of these chronic diseases. verbalize basic understanding of Anxiety and Gastroparesis disease process and self health management plan as evidenced by improved management of these chronic diseases. take all medications exactly as prescribed and will call provider for  medication related questions as evidenced by being compliant with all medications    attend all scheduled medical appointments: No scheduled appointments at this time as evidenced by attending all scheduled medical appointments        demonstrate ongoing adherence to prescribed treatment plan for Anxiety and Gastroparesis as evidenced by better management of these diseases. continue to work with Medical illustrator and/or Social Worker to address care management and care coordination needs related to Anxiety and Gastroparesis as evidenced by adherence to CM Team Scheduled appointments    through collaboration with Medical illustrator, provider, and care team.   Interventions Inter-disciplinary care team collaboration (see longitudinal plan of care) Evaluation of current treatment plan related to  self management and patient's adherence to plan as established by provider  Gastroparesis  (Status:  Goal on track:  Yes.)  Long Term Goal Evaluation of current treatment plan related to  Gastroparesis , Financial constraints related to food stamp reduction, Limited access to food, and Mental Health Concerns  self-management and patient's adherence to plan as established by provider. Discussed plans with  patient for ongoing care management follow up and provided patient with direct contact information for care management team Evaluation of current treatment plan related to Gastroparesis and patient's adherence to plan as established by provider Reviewed medications with patient and discussed importance of medication compliance Reviewed scheduled/upcoming provider appointments  Anxiety with Tics  (Status:  Goal on track:  Yes.)  Long Term Goal Evaluation of current treatment plan related to Anxiety, Financial constraints related to food stamp reduction, Limited access to food, and Mental Health Concerns  self-management and patient's adherence to plan as established by provider. Discussed plans with patient for  ongoing care management follow up and provided patient with direct contact information for care management team Evaluation of current treatment plan related to Anxiety with Tics and patient's adherence to plan as established by provider Advised patient to be an advocate for herself when meeting with the new [facility] Neurologist next week and inform this neurologist of patient's past experience with previous neurologist of not feeling like she was listened to.   Reviewed medications with patient and discussed importance of medication compliance Reviewed scheduled/upcoming provider appointments Patient did attend Neurologist appointment on 05/30/2021 for evaluation.  EEG was ordered and performed to rule out seizures.  EEG was negative for seizures and patient relieved to hear this result.  Patient being pro-active to prevent full blown episodes of anxiety with tics when they occur, example: when patient feels lightheadedness starting, she was stop and slow down what she is doing to prevent the episode from progressing.    Patient Goals/Self-Care Activities: Take medications as prescribed   Attend all scheduled provider appointments Call pharmacy for medication refills 3-7 days in advance of running out of medications Perform all self care activities independently  Call provider office for new concerns or questions    Follow Up:  Patient agrees to Care Plan and Follow-up.  Plan: The Managed Medicaid care management team will reach out to the patient again over the next 30 days. and The  Patient has been provided with contact information for the Managed Medicaid care management team and has been advised to call with any health related questions or concerns.  Date/time of next scheduled RN care management/care coordination outreach: 09/06/21 at 230.

## 2021-07-31 NOTE — Patient Instructions (Signed)
Visit Information  Ms. Sparano was given information about Medicaid Managed Care team care coordination services as a part of their [facility] Medicaid benefit. [name] verbally consented to engagement with the [facility] Managed Care team.   If you are experiencing a medical emergency, please call 911 or report to your local emergency department or urgent care.   If you have a non-emergency medical problem during routine business hours, please contact your provider's office and ask to speak with a nurse.   For questions related to your [facility] health plan, please call: [phone] or go here:[email or web address]  If you would like to schedule transportation through your [facility] plan, please call the following number at least 2 days in advance of your appointment: [phone].  You can also use the MTM portal or MTM mobile app to manage your rides. For the portal, please go to [email or web address]  Call the [facility] Crisis Line at [phone], at any time, 24 hours a day, 7 days a week. If you are in danger or need immediate medical attention call 911.  If you would like help to quit smoking, call 1-800-QUIT-NOW ((609)740-6473) OR Espaol: 1-855-Djelo-Ya (7-408-144-8185) o para ms informacin haga clic aqu or Text READY to 631-497 to register via text  Ms. Norby - following are the goals we discussed in your visit today:   Goals Addressed    Note:   Timeframe:  Long-Range Goal Priority:  High Start Date:   07/31/21                          Expected End Date:   ongoing                    Follow Up Date 09/06/21    - practice safe sex - schedule appointment for vaccines needed due to my age or health - schedule recommended health tests (blood work, mammogram, colonoscopy, pap test) - schedule and keep appointment for annual check-up    Why is this important?   Screening tests can find diseases early when they are easier to treat.  Your  doctor or nurse will talk with you about which tests are important for you.  Getting shots for common diseases like the flu and shingles will help prevent them.     Patient verbalizes understanding of instructions and care plan provided today and agrees to view in MyChart. Active MyChart status and patient understanding of how to access instructions and care plan via MyChart confirmed with patient.     The Managed Medicaid care management team will reach out to the patient again over the next 30 business days.  The  Patient  has been provided with contact information for the Managed Medicaid care management team and has been advised to call with any health related questions or concerns.   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   Priority: High   Long-Range Goal: Development of Plan of Care for Chronic Disease Management and Care Coordination Needs (Gastroparesis, Anxiety)   Start Date: 04/27/2021  Expected End Date: 10/31/2021  Priority: High  Note:  Current Barriers:  Knowledge Deficits related to plan of care for management of Anxiety with Tics and Gastroparesis  Care Coordination needs related to Financial constraints related to food stamp reduction, Limited access to food, and Mental Health Concerns   Chronic Disease Management support and education needs related to Anxiety with Tics and Gastroparesis Financial Constraints.  07/31/21:  Patient with recent increase in tics-to schedule an appt with neuro, continues to see [facility] for therapy  RNCM Clinical Goal(s):  Patient will verbalize understanding of plan for management of Anxiety  and Gastroparesis as evidenced by improved management of these chronic diseases. verbalize basic understanding of Anxiety and Gastroparesis disease process and self health management plan as evidenced by improved management of these chronic diseases. take all medications exactly as prescribed and will call provider for medication related questions as evidenced by being compliant with all medications    attend all scheduled medical appointments: No scheduled appointments at this time as evidenced by attending all scheduled medical appointments        demonstrate ongoing adherence to prescribed treatment plan for Anxiety and Gastroparesis as evidenced by better management of these diseases. continue to work with Medical illustrator and/or Social Worker to address care management and care coordination needs related to Anxiety and Gastroparesis as evidenced by adherence to CM Team Scheduled appointments    through collaboration with Medical illustrator, provider, and care team.   Interventions Inter-disciplinary care team collaboration (see longitudinal plan of care) Evaluation of current treatment plan related to  self management and patient's adherence to plan as established by provider  Gastroparesis  (Status:  Goal on track:  Yes.)  Long Term Goal Evaluation of current treatment plan related to  Gastroparesis , Financial constraints related to food stamp reduction, Limited access to food, and Mental Health Concerns  self-management and patient's adherence to plan as established by provider. Discussed plans with patient for ongoing care management follow up and provided patient with direct contact information for care management team Evaluation of current treatment plan related to Gastroparesis and patient's adherence to plan as established by provider Reviewed medications with patient and discussed importance of medication compliance Reviewed scheduled/upcoming provider appointments  Anxiety with Tics   (Status:  Goal on track:  Yes.)  Long Term Goal Evaluation of current treatment plan related to Anxiety, Financial constraints related to food stamp reduction, Limited access to food, and Mental Health Concerns  self-management and patient's adherence to plan as established by provider. Discussed plans with patient for ongoing care management follow up and provided patient with direct contact information for care management team Evaluation of current treatment plan related to Anxiety with Tics and patient's adherence to plan as established by provider Advised patient to be an advocate for herself when meeting with the new [facility] Neurologist next week and inform this neurologist of patient's past experience with previous neurologist of not feeling like she was listened to.   Reviewed medications with patient and discussed importance of medication compliance Reviewed scheduled/upcoming provider appointments Patient did attend Neurologist appointment on 05/30/2021 for evaluation.  EEG was ordered and performed to rule out seizures.  EEG was negative for seizures and patient relieved to hear this result.  Patient being pro-active to prevent full blown episodes of anxiety with tics when they occur, example: when patient feels lightheadedness starting, she was stop and slow down what she is doing to prevent the episode from progressing.    Patient Goals/Self-Care Activities: Take medications as prescribed  Attend all scheduled provider appointments Call pharmacy for medication refills 3-7 days in advance of running out of medications Perform all self care activities independently  Call provider office for new concerns or questions

## 2021-08-08 ENCOUNTER — Ambulatory Visit: Admission: EM | Admit: 2021-08-08 | Discharge: 2021-08-08 | Payer: Medicaid Other

## 2021-08-08 ENCOUNTER — Emergency Department (HOSPITAL_BASED_OUTPATIENT_CLINIC_OR_DEPARTMENT_OTHER)
Admission: EM | Admit: 2021-08-08 | Discharge: 2021-08-08 | Disposition: A | Discharge status: Home / Self Care | Payer: Medicaid Other | Attending: Emergency Medicine | Admitting: Emergency Medicine

## 2021-08-08 ENCOUNTER — Other Ambulatory Visit: Payer: Self-pay

## 2021-08-08 ENCOUNTER — Emergency Department (HOSPITAL_BASED_OUTPATIENT_CLINIC_OR_DEPARTMENT_OTHER): Payer: Medicaid Other

## 2021-08-08 ENCOUNTER — Telehealth: Payer: Self-pay

## 2021-08-08 ENCOUNTER — Encounter (HOSPITAL_BASED_OUTPATIENT_CLINIC_OR_DEPARTMENT_OTHER): Payer: Self-pay | Admitting: Emergency Medicine

## 2021-08-08 DIAGNOSIS — Z79899 Other long term (current) drug therapy: Secondary | ICD-10-CM | POA: Diagnosis not present

## 2021-08-08 DIAGNOSIS — R197 Diarrhea, unspecified: Secondary | ICD-10-CM

## 2021-08-08 DIAGNOSIS — R1084 Generalized abdominal pain: Secondary | ICD-10-CM | POA: Diagnosis present

## 2021-08-08 DIAGNOSIS — R112 Nausea with vomiting, unspecified: Secondary | ICD-10-CM

## 2021-08-08 DIAGNOSIS — K529 Noninfective gastroenteritis and colitis, unspecified: Secondary | ICD-10-CM | POA: Insufficient documentation

## 2021-08-08 DIAGNOSIS — R109 Unspecified abdominal pain: Secondary | ICD-10-CM | POA: Diagnosis not present

## 2021-08-08 LAB — CBC
HCT: 43.8 % (ref 36.0–46.0)
Hemoglobin: 14.6 g/dL (ref 12.0–15.0)
MCH: 28.2 pg (ref 26.0–34.0)
MCHC: 33.3 g/dL (ref 30.0–36.0)
MCV: 84.6 fL (ref 80.0–100.0)
Platelets: 341 10*3/uL (ref 150–400)
RBC: 5.18 MIL/uL — ABNORMAL HIGH (ref 3.87–5.11)
RDW: 13.2 % (ref 11.5–15.5)
WBC: 15.3 10*3/uL — ABNORMAL HIGH (ref 4.0–10.5)
nRBC: 0 % (ref 0.0–0.2)

## 2021-08-08 LAB — URINALYSIS, MICROSCOPIC (REFLEX)

## 2021-08-08 LAB — URINALYSIS, ROUTINE W REFLEX MICROSCOPIC
Glucose, UA: NEGATIVE mg/dL
Hgb urine dipstick: NEGATIVE
Ketones, ur: NEGATIVE mg/dL
Nitrite: NEGATIVE
Protein, ur: 30 mg/dL — AB
Specific Gravity, Urine: >=1.03 (ref 1.005–1.030)
pH: 5.5 (ref 5.0–8.0)

## 2021-08-08 LAB — COMPREHENSIVE METABOLIC PANEL
ALT: 31 U/L (ref 0–44)
AST: 26 U/L (ref 15–41)
Albumin: 4.2 g/dL (ref 3.5–5.0)
Alkaline Phosphatase: 78 U/L (ref 38–126)
Anion gap: 9 (ref 5–15)
BUN: 14 mg/dL (ref 6–20)
CO2: 21 mmol/L — ABNORMAL LOW (ref 22–32)
Calcium: 9.2 mg/dL (ref 8.9–10.3)
Chloride: 107 mmol/L (ref 98–111)
Creatinine, Ser: 0.79 mg/dL (ref 0.44–1.00)
GFR, Estimated: >60 mL/min (ref >60)
Glucose, Bld: 114 mg/dL — ABNORMAL HIGH (ref 70–99)
Potassium: 4.1 mmol/L (ref 3.5–5.1)
Sodium: 137 mmol/L (ref 135–145)
Total Bilirubin: 1 mg/dL (ref 0.3–1.2)
Total Protein: 7.6 g/dL (ref 6.5–8.1)

## 2021-08-08 LAB — LIPASE, BLOOD: Lipase: 31 U/L (ref 11–51)

## 2021-08-08 LAB — PREGNANCY, URINE: Preg Test, Ur: NEGATIVE

## 2021-08-08 MED ORDER — AMOXICILLIN-POT CLAVULANATE 875-125 MG PO TABS: 1.0000 | ORAL_TABLET | Freq: Two times a day (BID) | ORAL | 0 refills | Status: AC

## 2021-08-08 MED ORDER — IOHEXOL 300 MG/ML  SOLN
125.0000 mL | Freq: Once | INTRAMUSCULAR | Status: AC | PRN
  Administered 2021-08-08: 125 mL via INTRAVENOUS

## 2021-08-08 MED ORDER — SODIUM CHLORIDE 0.9 % IV BOLUS
1000.0000 mL | Freq: Once | INTRAVENOUS | Status: AC
  Administered 2021-08-08: 1000 mL via INTRAVENOUS

## 2021-08-08 MED ORDER — IOHEXOL 300 MG/ML  SOLN: 100.0000 mL | Freq: Once | INTRAMUSCULAR | Status: DC | PRN

## 2021-08-08 NOTE — ED Triage Notes (Signed)
Pt presents with generalized abdominal pain, nausea, vomiting, and diarrhea since waking up this morning. 

## 2021-08-08 NOTE — Discharge Instructions (Addendum)
Call your primary care doctor or specialist as discussed in the next 2-3 days.   Return immediately back to the ER if:  Your symptoms worsen within the next 12-24 hours. You develop new symptoms such as new fevers, persistent vomiting, new pain, shortness of breath, or new weakness or numbness, or if you have any other concerns.  

## 2021-08-08 NOTE — ED Triage Notes (Signed)
Abdominal pain since this am.  N/V/D since this am.  No known fever.  Pt concerned for appendicitis.

## 2021-08-08 NOTE — Telephone Encounter (Signed)
Patient calls nurse line from [facility] regarding severe abdominal pain. Patient is concerned about her appendix. She was told at the urgent care that they do not have an ultrasound to evaluate for appendicitis.   Patient is asking if we have any same day appointments for today. Advised patient that we do not have any appointments for today and that I would recommend being evaluated in the ED, if urgent care is unable to perform evaluation for appendicitis.   Patient verbalizes understanding.   Veronda Prude, RN

## 2021-08-08 NOTE — ED Provider Notes (Signed)
[facility]    CSN: [id number] Arrival date & time: 08/08/21  1122      History   Chief Complaint Chief Complaint  Patient presents with   Abdominal Pain   Nausea   Emesis   Diarrhea    HPI Katelyn Aguilar is a 21 y.o. female.   Patient presents with abdominal pain, nausea, vomiting, diarrhea that started upon awakening this morning.  Patient reports the pain is a continuous cramping abdominal pain that is rated 10/10 on pain scale.  She reports history of gastroparesis but states that this "feels different" and that is more severe.  Patient also reports that her family member has had similar gastrointestinal symptoms recently but she states that this is not related.  Denies fever, body aches, chills, upper respiratory symptoms.  Denies blood in stool or emesis.  Abdominal pain is located across mid abdomen.   Abdominal Pain Emesis Diarrhea   Past Medical History:  Diagnosis Date   Allergic rhinitis    Anxiety    Anxiety 11/17/2018   Dysrhythmia    tachycardia   GERD (gastroesophageal reflux disease)    Nausea and vomiting 04/06/2019   Obesity    Pilonidal abscess 12/04/2019   Pilonidal cyst without abscess 04/18/2020   Pyelonephritis 09/15/2019    Patient Active Problem List   Diagnosis Date Noted   Allergic rhinitis 06/22/2021   Cervical cancer screening 11/29/2020   Elevated blood pressure reading 11/29/2020   Left otitis media 10/27/2020   Severe episode of recurrent major depressive disorder, 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   GAD (generalized anxiety disorder) 10/04/2020   Ingrown toenail of both feet 08/19/2020   Pain of left breast 06/26/2020   BMI 60.0-69.9, adult (HCC) 05/19/2020   Contraception management 05/19/2020   Anxiety with depression 05/09/2020   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/26/2020   Tachycardia 02/28/2020   Fatigue 02/28/2020   Screen for STD (sexually transmitted disease) 01/12/2020   Irregular menses  12/04/2019   Dizziness 04/22/2019   Cramp in limb 04/10/2019   Simple tics 11/17/2018   Involuntary movements 10/21/2018   Encounter for birth control 10/21/2018    Past Surgical History:  Procedure Laterality Date   BIOPSY  04/19/2020   Procedure: BIOPSY;  Surgeon: Tressia Danas, MD;  Location: [facility];  Service: Gastroenterology;;   ESOPHAGOGASTRODUODENOSCOPY     ESOPHAGOGASTRODUODENOSCOPY (EGD) WITH PROPOFOL N/A 04/19/2020   Procedure: ESOPHAGOGASTRODUODENOSCOPY (EGD) WITH PROPOFOL;  Surgeon: Tressia Danas, MD;  Location: [facility];  Service: Gastroenterology;  Laterality: N/A;   INCISION AND DRAINAGE ABSCESS N/A 04/26/2020   Procedure: excision of pilonidal cyst with open packing;  Surgeon: Darnell Level, MD;  Location: [facility];  Service: General;  Laterality: N/A;   NO PAST SURGERIES      OB History   No obstetric history on file.      Home Medications    Prior to Admission medications   Medication Sig Start Date End Date Taking? Authorizing Provider  acetaminophen (TYLENOL) 500 MG tablet Take 1 tablet (500 mg total) by mouth every 6 (six) hours as needed. 12/21/20   Derwood Kaplan, MD  albuterol (VENTOLIN HFA) 108 (90 Base) MCG/ACT inhaler Inhale 1-2 puffs into the lungs every 6 (six) hours as needed for wheezing or shortness of breath. 03/10/21   [name], PA-C  buPROPion (WELLBUTRIN XL) 150 MG 24 hr tablet Take 1 tablet (150 mg total) by mouth every morning. 07/24/21   Shanna Cisco, NP  calcium carbonate (  TUMS - DOSED IN MG ELEMENTAL CALCIUM) 500 MG chewable tablet Chew 1 tablet by mouth daily as needed for indigestion or heartburn.    [provider]  fluticasone (FLONASE) 50 MCG/ACT nasal spray Place 2 sprays into both nostrils daily. 06/19/21   Dana Allan, MD  hydrOXYzine (ATARAX) 25 MG tablet Take 1 tablet (25 mg total) by mouth 3 (three) times daily as needed for anxiety. 07/24/21   Shanna Cisco, NP  ibuprofen (ADVIL) 600  MG tablet Take 600 mg by mouth every 6 (six) hours as needed for headache or moderate pain.    [provider]  levocetirizine (XYZAL) 5 MG tablet Take 1 tablet (5 mg total) by mouth every evening. 06/19/21   Dana Allan, MD    Family History Family History  Problem Relation Age of Onset   COPD Mother    Depression Mother    Cancer Mother        blood cancer   Diabetes Other    Colon cancer Neg Hx    Esophageal cancer Neg Hx    Pancreatic cancer Neg Hx    Stomach cancer Neg Hx     Social History Social History   Tobacco Use   Smoking status: Never    Passive exposure: Yes   Smokeless tobacco: Never  Vaping Use   Vaping Use: Former  Substance Use Topics   Alcohol use: Yes    Comment: occasional/rare   Drug use: Yes    Frequency: 3.0 times per week    Types: Other-see comments    Comment: 'Delta'(form of THC) edible and vapes     Allergies   Patient has no known allergies.   Review of Systems Review of Systems Per HPI  Physical Exam Triage Vital Signs ED Triage Vitals  Enc Vitals Group     BP 08/08/21 1136 134/87     Pulse Rate 08/08/21 1136 (S) (!) 120     Resp 08/08/21 1136 18     Temp 08/08/21 1136 (!) 97.5 F (36.4 C)     Temp Source 08/08/21 1136 Oral     SpO2 08/08/21 1136 97 %     Weight --      Height --      Head Circumference --      Peak Flow --      Pain Score 08/08/21 1135 9     Pain Loc --      Pain Edu? --      Excl. in GC? --    No data found.  Updated Vital Signs BP 134/87 (BP Location: Left Arm)   Pulse (S) (!) 120 Comment: HR normally high  Temp (!) 97.5 F (36.4 C) (Oral)   Resp 18   LMP 07/14/2021   SpO2 97%   Visual Acuity Right Eye Distance:   Left Eye Distance:   Bilateral Distance:    Right Eye Near:   Left Eye Near:    Bilateral Near:     Physical Exam Constitutional:      General: She is not in acute distress.    Appearance: Normal appearance. She is not toxic-appearing or diaphoretic.  HENT:      Head: Normocephalic and atraumatic.  Eyes:     Extraocular Movements: Extraocular movements intact.     Conjunctiva/sclera: Conjunctivae normal.  Cardiovascular:     Rate and Rhythm: Regular rhythm. Tachycardia present.     Pulses: Normal pulses.     Heart sounds: Normal heart sounds.  Pulmonary:  Effort: Pulmonary effort is normal. No respiratory distress.     Breath sounds: Normal breath sounds.  Abdominal:     General: Bowel sounds are normal.     Palpations: Abdomen is soft.     Tenderness: There is abdominal tenderness.       Comments: Tenderness across mid abdomen.   Neurological:     General: No focal deficit present.     Mental Status: She is alert and oriented to person, place, and time. Mental status is at baseline.  Psychiatric:        Mood and Affect: Mood normal.        Behavior: Behavior normal.        Thought Content: Thought content normal.        Judgment: Judgment normal.      UC Treatments / Results  Labs (all labs ordered are listed, but only abnormal results are displayed) Labs Reviewed - No data to display  EKG   Radiology No results found.  Procedures Procedures (including critical care time)  Medications Ordered in UC Medications - No data to display  Initial Impression / Assessment and Plan / UC Course  I have reviewed the triage vital signs and the nursing notes.  Pertinent labs & imaging results that were available during my care of the patient were reviewed by me and considered in my medical decision making (see chart for details).     Highly suspicious the patient's symptoms could be related to viral illness versus gastroparesis.  Although, this patient is complaining of severe abdominal pain and is not able to sit still.  Advised patient that she may need imaging of the abdomen to rule out worrisome etiology which cannot provided here in urgent care.  Patient advised to go to the ER for further evaluation and management.   Patient verbalized understanding and was agreeable with plan.  Vital signs stable at discharge.  Agree with patient self transport to hospital. Final Clinical Impressions(s) / UC Diagnoses   Final diagnoses:  Continuous severe abdominal pain  Nausea vomiting and diarrhea     Discharge Instructions      Go to the emergency department as soon as you leave urgent care for further evaluation and management.    ED Prescriptions   None    PDMP not reviewed this encounter.   Gustavus Bryant, [place] 08/08/21 [phone]

## 2021-08-08 NOTE — Discharge Instructions (Signed)
Go to the emergency department as soon as you leave urgent care for further evaluation and management. 

## 2021-08-08 NOTE — ED Provider Notes (Signed)
[facility] EMERGENCY DEPARTMENT Provider Note   CSN: [id number] Arrival date & time: 08/08/21  1210     History  Chief Complaint  Patient presents with   Abdominal Pain    Katelyn Aguilar is a 21 y.o. female.  Patient with history of gastroparesis in the past, presents with complaint of abdominal pain.  Describes as aching mid abdominal pain.  She states it woke her up this morning also associated with vomiting and diarrhea nonbloody nonbilious.  Denies any fevers or cough.  She states abdominal pain was more severe in the morning, she went to urgent care and was sent to the ER for rule out appendicitis.       Home Medications Prior to Admission medications   Medication Sig Start Date End Date Taking? Authorizing Provider  acetaminophen (TYLENOL) 500 MG tablet Take 1 tablet (500 mg total) by mouth every 6 (six) hours as needed. 12/21/20   Derwood Kaplan, MD  albuterol (VENTOLIN HFA) 108 (90 Base) MCG/ACT inhaler Inhale 1-2 puffs into the lungs every 6 (six) hours as needed for wheezing or shortness of breath. 03/10/21   [name], PA-C  buPROPion (WELLBUTRIN XL) 150 MG 24 hr tablet Take 1 tablet (150 mg total) by mouth every morning. 07/24/21   Shanna Cisco, NP  calcium carbonate (TUMS - DOSED IN MG ELEMENTAL CALCIUM) 500 MG chewable tablet Chew 1 tablet by mouth daily as needed for indigestion or heartburn.    [provider]  fluticasone (FLONASE) 50 MCG/ACT nasal spray Place 2 sprays into both nostrils daily. 06/19/21   Dana Allan, MD  hydrOXYzine (ATARAX) 25 MG tablet Take 1 tablet (25 mg total) by mouth 3 (three) times daily as needed for anxiety. 07/24/21   Shanna Cisco, NP  ibuprofen (ADVIL) 600 MG tablet Take 600 mg by mouth every 6 (six) hours as needed for headache or moderate pain.    [provider]  levocetirizine (XYZAL) 5 MG tablet Take 1 tablet (5 mg total) by mouth every evening. 06/19/21   Dana Allan, MD       Allergies    Patient has no known allergies.    Review of Systems   Review of Systems  Constitutional:  Negative for fever.  HENT:  Negative for ear pain.   Eyes:  Negative for pain.  Respiratory:  Negative for cough.   Cardiovascular:  Negative for chest pain.  Gastrointestinal:  Positive for abdominal pain.  Genitourinary:  Negative for flank pain.  Musculoskeletal:  Negative for back pain.  Skin:  Negative for rash.  Neurological:  Negative for headaches.    Physical Exam Updated Vital Signs BP 118/68   Pulse (!) 103   Temp 98.5 F (36.9 C) (Oral)   Resp 18   Ht 5\' 3"  (1.6 m)   Wt (!) 149.7 kg   LMP 07/14/2021   SpO2 100%   BMI 58.46 kg/m  Physical Exam Constitutional:      General: She is not in acute distress.    Appearance: Normal appearance.  HENT:     Head: Normocephalic.     Nose: Nose normal.  Eyes:     Extraocular Movements: Extraocular movements intact.  Cardiovascular:     Rate and Rhythm: Normal rate.  Pulmonary:     Effort: Pulmonary effort is normal.  Abdominal:     Tenderness: There is no abdominal tenderness. There is no guarding or rebound.  Musculoskeletal:        General:  Normal range of motion.     Cervical back: Normal range of motion.  Neurological:     General: No focal deficit present.     Mental Status: She is alert. Mental status is at baseline.     ED Results / Procedures / Treatments   Labs (all labs ordered are listed, but only abnormal results are displayed) Labs Reviewed  COMPREHENSIVE METABOLIC PANEL - Abnormal; Notable for the following components:      Result Value   CO2 21 (*)    Glucose, Bld 114 (*)    All other components within normal limits  CBC - Abnormal; Notable for the following components:   WBC 15.3 (*)    RBC 5.18 (*)    All other components within normal limits  URINALYSIS, ROUTINE W REFLEX MICROSCOPIC - Abnormal; Notable for the following components:   APPearance CLOUDY (*)    Bilirubin Urine  SMALL (*)    Protein, ur 30 (*)    Leukocytes,Ua TRACE (*)    All other components within normal limits  URINALYSIS, MICROSCOPIC (REFLEX) - Abnormal; Notable for the following components:   Bacteria, UA FEW (*)    All other components within normal limits  LIPASE, BLOOD  PREGNANCY, URINE  HCG, SERUM, QUALITATIVE    EKG None  Radiology CT Abdomen Pelvis W Contrast  Result Date: 08/08/2021 CLINICAL DATA:  Acute abdominal pain common nonlocalized. EXAM: CT ABDOMEN AND PELVIS WITH CONTRAST TECHNIQUE: Multidetector CT imaging of the abdomen and pelvis was performed using the standard protocol following bolus administration of intravenous contrast. RADIATION DOSE REDUCTION: This exam was performed according to the departmental dose-optimization program which includes automated exposure control, adjustment of the mA and/or kV according to patient size and/or use of iterative reconstruction technique. CONTRAST:  OMNIPAQUE IOHEXOL 300 MG/ML  SOLN COMPARISON:  CT December 21, 2020 FINDINGS: Lower chest: No acute abnormality. Hepatobiliary: No diffuse hepatic steatosis. Gallbladder is unremarkable. No biliary ductal dilation. Pancreas: No pancreatic ductal dilation or evidence of acute inflammation. Spleen: Normal in size without focal abnormality. Adrenals/Urinary Tract: Adrenal glands are unremarkable. Kidneys are normal, without renal calculi, solid enhancing lesion, or hydronephrosis. Bladder is unremarkable. Stomach/Bowel: No radiopaque enteric contrast material was administered. Stomach is unremarkable for degree of distension. No pathologic dilation of small or large bowel. The appendix appears normal. Mild inflammation along the terminal ileum with adjacent prominent lymph nodes. Gas fluid levels throughout the colon suggestive of diarrheal illness. Vascular/Lymphatic: No significant vascular findings are present. No enlarged abdominal or pelvic lymph nodes. Reproductive: Uterus and bilateral  adnexa are unremarkable. Other: No abdominal wall hernia or abnormality. No abdominopelvic ascites. Musculoskeletal: No acute or significant osseous findings. IMPRESSION: 1. Mild inflammation along the terminal ileum with adjacent prominent lymph nodes suggestive of infectious or inflammatory terminal ileitis including Crohn's disease. 2. Gas fluid levels throughout the colon as can be seen with diarrheal illness. 3. Diffuse hepatic steatosis which in the setting of steatohepatitis can be a cause of abdominal pain correlation with laboratory values suggested. Electronically Signed   By: Maudry Mayhew M.D.   On: 08/08/2021 18:04    Procedures Procedures    Medications Ordered in ED Medications  sodium chloride 0.9 % bolus 1,000 mL ( Intravenous Stopped 08/08/21 1643)  iohexol (OMNIPAQUE) 300 MG/ML solution 125 mL (125 mLs Intravenous Contrast Given 08/08/21 1743)    ED Course/ Medical Decision Making/ A&P  Medical Decision Making Amount and/or Complexity of Data Reviewed Labs: ordered. Radiology: ordered.  Risk Prescription drug management.   Review of record shows urgent care visit earlier today complaining of severe abdominal pain.  Cardiac monitor shows sinus rhythm, rapid rate.  History from fianc at bedside.  Diagnosis includes labs, urinalysis and imaging study.  IV fluids given.  Patient exam is benign at this time.          Final Clinical Impression(s) / ED Diagnoses Final diagnoses:  Generalized abdominal pain  Colitis    Rx / DC Orders ED Discharge Orders     None         Cheryll Cockayne, MD 08/08/21 1911

## 2021-08-08 NOTE — ED Notes (Signed)
Patient is being discharged from the Urgent Care and sent to the Emergency Department via personal vehicle . Per Provider [facility], patient is in need of higher level of care due to severe abdominal pain & vomiting. Patient is aware and verbalizes understanding of plan of care.   Vitals:   08/08/21 1136  BP: 134/87  Pulse: (S) (!) 120  Resp: 18  Temp: (!) 97.5 F (36.4 C)  SpO2: 97%

## 2021-08-17 ENCOUNTER — Ambulatory Visit (INDEPENDENT_AMBULATORY_CARE_PROVIDER_SITE_OTHER): Payer: Medicaid Other | Admitting: Clinical

## 2021-08-17 DIAGNOSIS — F411 Generalized anxiety disorder: Secondary | ICD-10-CM | POA: Diagnosis not present

## 2021-08-19 NOTE — Progress Notes (Signed)
THERAPIST PROGRESS NOTE Virtual Visit via Telephone Note  I connected with Katelyn Aguilar on 08/17/2021 at 11:00 AM EDT by telephone and verified that I am speaking with the correct person using two identifiers.  Location: Patient: home Provider: office   I discussed the limitations, risks, security and privacy concerns of performing an evaluation and management service by telephone and the availability of in person appointments. I also discussed with the patient that there may be a patient responsible charge related to this service. The patient expressed understanding and agreed to proceed.   Follow Up Instructions: I discussed the assessment and treatment plan with the patient. The patient was provided an opportunity to ask questions and all were answered. The patient agreed with the plan and demonstrated an understanding of the instructions.   The patient was advised to call back or seek an in-person evaluation if the symptoms worsen or if the condition fails to improve as anticipated.   Session Time: 30 minutes  Participation Level: Active  Behavioral Response: CasualAlertDepressed  Type of Therapy: Individual Therapy  Treatment Goals addressed: patient will practice problem solving skills 3 times per week   ProgressTowards Goals: Not Progressing  Interventions: CBT  Summary: Katelyn Aguilar is a 21 y.o. female who presents for the scheduled appointment oriented times five and friendly. Client denied hallucinations and delusions. Client reported she has been feeling stressed. Client reported she lives with her mother, step father, and fiance. Client reported her step father and fiance have been struggling to pay the bills. Client reported her mother was also dropped from her medicaid because they think they make too much money. Client reported her mother has cancer and COPD. Client reported she is worried about her mom. Client reported she has ongoing tics and uses [employer]  [employer] to help her manage the symptoms. Client reported their food stamps have been cut and they go to food pantries when the food is low. Client reported depressed thoughts about being estranged from her mother and now that they have reunited her health has declined and doesn't like to see her mom going without medical treatment.  Evidence of progress towards goal:  client reported 1 positive behavioral activation skill of using emotional support from her family. Client reported she is researching resources to help with their needs.   Suicidal/Homicidal: Nowithout intent/plan  Therapist Response:  Therapist began the appointment asking the client how she has been doing. Therapist used CBT to engage using active listening and positive emotional support. Therapist used CBT to engage and ask the client to identify the stressors contributing to her depression. Therapist used CBT to normalize the clients emotions. Therapist used CBT to discuss using family support and relaxation activities. Therapist used CBT ask the client to identify her progress with frequency of use with coping skills with continued practice in her daily activity.    Therapist assigned the client homework to practice self care.    Plan: Return again in 5 weeks.  Diagnosis: generalized anxiety disorder  Collaboration of Care: Patient refused AEB none  Patient/Guardian was advised Release of Information must be obtained prior to any record release in order to collaborate their care with an outside provider. Patient/Guardian was advised if they have not already done so to contact the registration department to sign all necessary forms in order for [place] to release information regarding their care.   Consent: Patient/Guardian gives verbal consent for treatment and assignment of benefits for services provided during this visit. Patient/Guardian  expressed understanding and agreed to proceed.   Neena Rhymes Alazay Leicht, LCSW 08/19/2021

## 2021-08-19 NOTE — Plan of Care (Signed)
  Problem: Anxiety Disorder CCP Problem  1 GAD Goal: STG: Patient will practice problem solving skills 3 times per week for the next 4 weeks Outcome: Progressing   Problem: Anxiety Disorder CCP Problem  1 GAD Goal: LTG: Patient will score less than 5 on the Generalized Anxiety Disorder 7 Scale (GAD-7) Outcome: Not Progressing Goal: STG: Patient will participate in at least 80% of scheduled individual psychotherapy sessions Outcome: Not Progressing Goal: STG: Patient will reduce frequency of avoidant behaviors by 50% as evidenced by self-report in therapy sessions Outcome: Not Progressing

## 2021-08-29 DIAGNOSIS — Z419 Encounter for procedure for purposes other than remedying health state, unspecified: Secondary | ICD-10-CM | POA: Diagnosis not present

## 2021-09-06 ENCOUNTER — Other Ambulatory Visit: Payer: Self-pay | Admitting: Obstetrics and Gynecology

## 2021-09-06 NOTE — Patient Outreach (Signed)
Medicaid Managed Care   Nurse Care Manager Note  09/06/2021 Name:  Katelyn Aguilar MRN:  [id number] DOB:  [birth year]  Katelyn Aguilar is an 21 y.o. year old female who is a primary patient of Darral Dash, DO.  The [facility] Managed Care Coordination team was consulted for assistance with:    Healthcare management needs, anxiety/depression/tics, gastroparesis  Katelyn Aguilar was given information about Medicaid Managed Care Coordination team services today. Katelyn Aguilar Patient agreed to services and verbal consent obtained.  Engaged with patient by telephone for follow up visit in response to provider referral for case management and/or care coordination services.   Assessments/Interventions:  Review of past medical history, allergies, medications, health status, including review of consultants reports, laboratory and other test data, was performed as part of comprehensive evaluation and provision of chronic care management services.  SDOH (Social Determinants of Health) assessments and interventions performed: SDOH Interventions    Flowsheet Row Most Recent Value  SDOH Interventions   Food Insecurity Interventions Intervention Not Indicated       Care Plan  No Known Allergies  Medications Reviewed Today     Reviewed by Danie Chandler, RN (Registered Nurse) on 09/06/21 at 1418  Med List Status: <None>   Medication Order Taking? Sig Documenting Provider Last Dose Status Informant  acetaminophen (TYLENOL) 500 MG tablet [id number] No Take 1 tablet (500 mg total) by mouth every 6 (six) hours as needed.  Patient not taking: Reported on 09/06/2021   Derwood Kaplan, MD Not Taking Active   albuterol (VENTOLIN HFA) 108 (90 Base) MCG/ACT inhaler [id number] No Inhale 1-2 puffs into the lungs every 6 (six) hours as needed for wheezing or shortness of breath.  Patient not taking: Reported on 09/06/2021   [name], PA-C Not Taking Active   amoxicillin-clavulanate (AUGMENTIN)  875-125 MG tablet [id number] No Take 1 tablet by mouth every 12 (twelve) hours.  Patient not taking: Reported on 09/06/2021   Cheryll Cockayne, MD Not Taking Active   buPROPion (WELLBUTRIN XL) 150 MG 24 hr tablet [id number] No Take 1 tablet (150 mg total) by mouth every morning.  Patient not taking: Reported on 09/06/2021   Shanna Cisco, NP Not Taking Active   calcium carbonate (TUMS - DOSED IN MG ELEMENTAL CALCIUM) 500 MG chewable tablet [id number] No Chew 1 tablet by mouth daily as needed for indigestion or heartburn.  Patient not taking: Reported on 09/06/2021   [provider] Not Taking Active Self, Pharmacy Records  fluticasone [facility]) 50 MCG/ACT nasal spray [id number] No Place 2 sprays into both nostrils daily.  Patient not taking: Reported on 09/06/2021   Dana Allan, MD Not Taking Active   hydrOXYzine (ATARAX) 25 MG tablet [id number] No Take 1 tablet (25 mg total) by mouth 3 (three) times daily as needed for anxiety.  Patient not taking: Reported on 09/06/2021   Shanna Cisco, NP Not Taking Active   ibuprofen (ADVIL) 600 MG tablet [id number] No Take 600 mg by mouth every 6 (six) hours as needed for headache or moderate pain.  Patient not taking: Reported on 09/06/2021   [provider] Not Taking Active Self, Pharmacy Records  levocetirizine (XYZAL) 5 MG tablet [id number] No Take 1 tablet (5 mg total) by mouth every evening.  Patient not taking: Reported on 09/06/2021   Dana Allan, MD Not Taking Active             Patient Active Problem List   Diagnosis Date Noted  Allergic rhinitis 06/22/2021   Cervical cancer screening 11/29/2020   Elevated blood pressure reading 11/29/2020   Left otitis media 10/27/2020   Severe episode of recurrent major depressive disorder, without psychotic features ([place]) 10/04/2020   GAD (generalized anxiety disorder) 10/04/2020   Ingrown toenail of both feet 08/19/2020   Pain of left breast 06/26/2020   BMI 60.0-69.9, adult ([place])  05/19/2020   Contraception management 05/19/2020   Anxiety with depression 05/09/2020   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/26/2020   Tachycardia 02/28/2020   Fatigue 02/28/2020   Screen for STD (sexually transmitted disease) 01/12/2020   Irregular menses 12/04/2019   Dizziness 04/22/2019   Cramp in limb 04/10/2019   Simple tics 11/17/2018   Involuntary movements 10/21/2018   Encounter for birth control 10/21/2018   Conditions to be addressed/monitored per PCP order:  Healthcare management needs, anxiety/depression/tics, gastroparesis  Care Plan : RN Care Manager Plan of Care  Updates made by Gayla Medicus, RN since 09/06/2021 12:00 AM     Problem: Chronic Disease Management and Care Coordiantion Needs for Gastroparesis and Anxiety with Tics   Priority: High     Goal: Establish Plan of Care for Chronic Disease Management Needs   Priority: High  Note:   Timeframe:  Long-Range Goal Priority:  High Start Date:   07/31/21                          Expected End Date:   ongoing                    Follow Up Date 10/17/21   - practice safe sex - schedule appointment for vaccines needed due to my age or health - schedule recommended health tests (blood work, mammogram, colonoscopy, pap test) - schedule and keep appointment for annual check-up    Why is this important?   Screening tests can find diseases early when they are easier to treat.  Your doctor or nurse will talk with you about which tests are important for you.  Getting shots for common diseases like the flu and shingles will help prevent them.   09/06/21:  Patient to make follow up appt with neuro.   Long-Range Goal: Development of Plan of Care for Chronic Disease Management and Care Coordination Needs (Gastroparesis, Anxiety)   Start Date: 04/27/2021  Expected End Date: 10/31/2021  Priority: High  Note:   Current Barriers:  Knowledge Deficits related to plan of care for management of Anxiety with Tics and Gastroparesis  Care  Coordination needs related to Financial constraints related to food stamp reduction, Limited access to food, and Mental Health Concerns   Chronic Disease Management support and education needs related to Anxiety with Tics and Gastroparesis Financial Constraints.  09/06/21:  Patient seen in ED 08/08/21 for abdominal pain, states she is better now.  Patient is currently visiting sister in [place] about moving there, not sure?  States she is not taking any of her medicines and is doing better not being in [place]-undecided as to what to do.  Has not followed up with NEURO yet-states she is having less tics/stress.  Still seeing Brittney Parsons-virtual appt.  RNCM Clinical Goal(s):  Patient will verbalize understanding of plan for management of Anxiety and Gastroparesis as evidenced by improved management of these chronic diseases. verbalize basic understanding of Anxiety and Gastroparesis disease process and self health management plan as evidenced by improved management of these chronic diseases. take all medications exactly  as prescribed and will call provider for medication related questions as evidenced by being compliant with all medications    attend all scheduled medical appointments: No scheduled appointments at this time as evidenced by attending all scheduled medical appointments        demonstrate ongoing adherence to prescribed treatment plan for Anxiety and Gastroparesis as evidenced by better management of these diseases. continue to work with Medical illustrator and/or Social Worker to address care management and care coordination needs related to Anxiety and Gastroparesis as evidenced by adherence to CM Team Scheduled appointments    through collaboration with Medical illustrator, provider, and care team.   Interventions Inter-disciplinary care team collaboration (see longitudinal plan of care) Evaluation of current treatment plan related to  self management and patient's adherence to plan  as established by provider  Gastroparesis  (Status:  Goal on track:  Yes.)  Long Term Goal Evaluation of current treatment plan related to  Gastroparesis , Financial constraints related to food stamp reduction, Limited access to food, and Mental Health Concerns  self-management and patient's adherence to plan as established by provider. Discussed plans with patient for ongoing care management follow up and provided patient with direct contact information for care management team Evaluation of current treatment plan related to Gastroparesis and patient's adherence to plan as established by provider Reviewed medications with patient and discussed importance of medication compliance Reviewed scheduled/upcoming provider appointments  Anxiety with Tics  (Status:  Goal on track:  Yes.)  Long Term Goal Evaluation of current treatment plan related to Anxiety, Financial constraints related to food stamp reduction, Limited access to food, and Mental Health Concerns  self-management and patient's adherence to plan as established by provider. Discussed plans with patient for ongoing care management follow up and provided patient with direct contact information for care management team Evaluation of current treatment plan related to Anxiety with Tics and patient's adherence to plan as established by provider Advised patient to be an advocate for herself when meeting with the new [facility] Neurologist next week and inform this neurologist of patient's past experience with previous neurologist of not feeling like she was listened to.   Reviewed medications with patient and discussed importance of medication compliance Reviewed scheduled/upcoming provider appointments Patient did attend Neurologist appointment on 05/30/2021 for evaluation.  EEG was ordered and performed to rule out seizures.  EEG was negative for seizures and patient relieved to hear this result.  Patient being pro-active to prevent full blown  episodes of anxiety with tics when they occur, example: when patient feels lightheadedness starting, she was stop and slow down what she is doing to prevent the episode from progressing.    Patient Goals/Self-Care Activities: Take medications as prescribed   Attend all scheduled provider appointments Call pharmacy for medication refills 3-7 days in advance of running out of medications Perform all self care activities independently  Call provider office for new concerns or questions    Follow Up:  Patient agrees to Care Plan and Follow-up.  Plan: The Managed Medicaid care management team will reach out to the patient again over the next 30 business  days. and The  Patient has been provided with contact information for the Managed Medicaid care management team and has been advised to call with any health related questions or concerns.  Date/time of next scheduled RN care management/care coordination outreach:  10/17/21 at 1230.

## 2021-09-06 NOTE — Patient Instructions (Signed)
Hey Ms. Cope, nice to speak with you-have a great visit with your sister!!  Ms. Hickam was given information about Medicaid Managed Care team care coordination services as a part of their [facility] Medicaid benefit. [name] verbally consented to engagement with the [facility] Managed Care team.   If you are experiencing a medical emergency, please call 911 or report to your local emergency department or urgent care.   If you have a non-emergency medical problem during routine business hours, please contact your provider's office and ask to speak with a nurse.   For questions related to your [facility] health plan, please call: [phone] or go here:[email or web address]  If you would like to schedule transportation through your [facility] plan, please call the following number at least 2 days in advance of your appointment: [phone].  You can also use the MTM portal or MTM mobile app to manage your rides. For the portal, please go to [email or web address]  Call the [facility] Crisis Line at [phone], at any time, 24 hours a day, 7 days a week. If you are in danger or need immediate medical attention call 911.  If you would like help to quit smoking, call 1-800-QUIT-NOW (7631275341) OR Espaol: 1-855-Djelo-Ya (0-347-425-9563) o para ms informacin haga clic aqu or Text READY to 875-643 to register via text  Ms. Benn - following are the goals we discussed in your visit today:   Goals Addressed    Timeframe:  Long-Range Goal Priority:  High Start Date:   07/31/21                          Expected End Date:   ongoing                    Follow Up Date 10/17/21   - practice safe sex - schedule appointment for vaccines needed due to my age or health - schedule recommended health tests (blood work, mammogram, colonoscopy, pap test) - schedule and keep appointment for annual check-up    Why is this important?   Screening tests can find  diseases early when they are easier to treat.  Your doctor or nurse will talk with you about which tests are important for you.  Getting shots for common diseases like the flu and shingles will help prevent them.   09/06/21:  Patient to make follow up appt with neuro.  Patient verbalizes understanding of instructions and care plan provided today and agrees to view in MyChart. Active MyChart status and patient understanding of how to access instructions and care plan via MyChart confirmed with patient.     The Managed Medicaid care management team will reach out to the patient again over the next 30 business  days.  The  Patient  has been provided with contact information for the Managed Medicaid care management team and has been advised to call with any health related questions or concerns.   Kathi Der RN, BSN [place]  [facility] Care Management Coordinator - Managed Medicaid High Risk [phone]   Following is a copy of your plan of care:  Care Plan : RN Care Manager Plan of Care  Updates made by Danie Chandler, RN since 09/06/2021 12:00 AM     Problem: Chronic Disease Management and Care Coordiantion Needs for Gastroparesis and Anxiety with Tics   Priority: High    Long-Range Goal: Development of Plan of Care for Chronic Disease Management and Care  Coordination Needs (Gastroparesis, Anxiety)   Start Date: 04/27/2021  Expected End Date: 10/31/2021  Priority: High  Note:   Current Barriers:  Knowledge Deficits related to plan of care for management of Anxiety with Tics and Gastroparesis  Care Coordination needs related to Financial constraints related to food stamp reduction, Limited access to food, and Mental Health Concerns   Chronic Disease Management support and education needs related to Anxiety with Tics and Gastroparesis Financial Constraints.  09/06/21:  Patient seen in ED 08/08/21 for abdominal pain, states she is better now.  Patient is currently visiting  sister in [place]-thinking about moving there, not sure?  States she is not taking any of her medicines and is doing better not being in [place]-undecided as to what to do.  Has not followed up with NEURO yet-states she is having less tics/stress.  Still seeing Brittney Parsons-virtual appt.  RNCM Clinical Goal(s):  Patient will verbalize understanding of plan for management of Anxiety and Gastroparesis as evidenced by improved management of these chronic diseases. verbalize basic understanding of Anxiety and Gastroparesis disease process and self health management plan as evidenced by improved management of these chronic diseases. take all medications exactly as prescribed and will call provider for medication related questions as evidenced by being compliant with all medications    attend all scheduled medical appointments: No scheduled appointments at this time as evidenced by attending all scheduled medical appointments        demonstrate ongoing adherence to prescribed treatment plan for Anxiety and Gastroparesis as evidenced by better management of these diseases. continue to work with Medical illustrator and/or Social Worker to address care management and care coordination needs related to Anxiety and Gastroparesis as evidenced by adherence to CM Team Scheduled appointments    through collaboration with Medical illustrator, provider, and care team.   Interventions Inter-disciplinary care team collaboration (see longitudinal plan of care) Evaluation of current treatment plan related to  self management and patient's adherence to plan as established by provider  Gastroparesis  (Status:  Goal on track:  Yes.)  Long Term Goal Evaluation of current treatment plan related to  Gastroparesis , Financial constraints related to food stamp reduction, Limited access to food, and Mental Health Concerns  self-management and patient's adherence to plan as established by provider. Discussed plans with patient for  ongoing care management follow up and provided patient with direct contact information for care management team Evaluation of current treatment plan related to Gastroparesis and patient's adherence to plan as established by provider Reviewed medications with patient and discussed importance of medication compliance Reviewed scheduled/upcoming provider appointments  Anxiety with Tics  (Status:  Goal on track:  Yes.)  Long Term Goal Evaluation of current treatment plan related to Anxiety, Financial constraints related to food stamp reduction, Limited access to food, and Mental Health Concerns  self-management and patient's adherence to plan as established by provider. Discussed plans with patient for ongoing care management follow up and provided patient with direct contact information for care management team Evaluation of current treatment plan related to Anxiety with Tics and patient's adherence to plan as established by provider Advised patient to be an advocate for herself when meeting with the new [facility] Neurologist next week and inform this neurologist of patient's past experience with previous neurologist of not feeling like she was listened to.   Reviewed medications with patient and discussed importance of medication compliance Reviewed scheduled/upcoming provider appointments Patient did attend Neurologist appointment on 05/30/2021 for evaluation.  EEG was ordered and performed to rule out seizures.  EEG was negative for seizures and patient relieved to hear this result.  Patient being pro-active to prevent full blown episodes of anxiety with tics when they occur, example: when patient feels lightheadedness starting, she was stop and slow down what she is doing to prevent the episode from progressing.    Patient Goals/Self-Care Activities: Take medications as prescribed   Attend all scheduled provider appointments Call pharmacy for medication refills 3-7 days in advance of running out  of medications Perform all self care activities independently  Call provider office for new concerns or questions

## 2021-09-10 NOTE — Plan of Care (Signed)
  Problem: Anxiety Disorder CCP Problem  1 GAD Goal: LTG: Patient will score less than 5 on the Generalized Anxiety Disorder 7 Scale (GAD-7) Outcome: Progressing Goal: STG: Patient will participate in at least 80% of scheduled individual psychotherapy sessions Outcome: Progressing Goal: STG: Patient will practice problem solving skills 3 times per week for the next 4 weeks Outcome: Progressing Goal: STG: Patient will reduce frequency of avoidant behaviors by 50% as evidenced by self-report in therapy sessions Outcome: Progressing

## 2021-09-10 NOTE — Progress Notes (Signed)
THERAPIST PROGRESS NOTE Virtual Visit via Telephone Note  I connected with [name] on 07/24/2021 at 11:00 AM EDT by telephone and verified that I am speaking with the correct person using two identifiers.  Location: Patient: home Provider: office   I discussed the limitations, risks, security and privacy concerns of performing an evaluation and management service by telephone and the availability of in person appointments. I also discussed with the patient that there may be a patient responsible charge related to this service. The patient expressed understanding and agreed to proceed.   Follow Up Instructions: I discussed the assessment and treatment plan with the patient. The patient was provided an opportunity to ask questions and all were answered. The patient agreed with the plan and demonstrated an understanding of the instructions.   The patient was advised to call back or seek an in-person evaluation if the symptoms worsen or if the condition fails to improve as anticipated.   Session Time: 16 minutes  Participation Level: Active  Behavioral Response: NAAlertAnxious  Type of Therapy: Individual Therapy  Treatment Goals addressed: Client will practice problem-solving skills 3 times per week for the next 4 weeks  ProgressTowards Goals: Progressing  Interventions: CBT and Supportive  Summary: NISREEN GUISE is a 21 y.o. female who presents for the scheduled appointment oriented x5, and friendly.  Client denied hallucinations and delusions. Client reported on today she has been experiencing some stress.  Client reported she always has the mindset that things could be worse but she has been worried about her mom and fianc.  Client reported she is worried about her mom's health and her fianc doing okay with working while she does not.  Client reported they collectively of household do their best to manage paying the bills.  Client reported in regards to the tics and  seizure symptoms that she was having.  Client reported she was tested at neurology and they have determined that she is not having seizures from what they can tell.  Client reported sometimes due to stress and not prioritizing taking care of herself she forgets to take some of her psychiatric medications.  Client reported as soon as she remembers she is back on track.  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  Client reported she manages to get some sleep but it is not of the best quality. Evidence of progress towards goal: Client reported she practices at least 1 skill 4 times out of the week which is reframing negative thoughts.    Suicidal/Homicidal: Nowithout intent/plan  Therapist Response:  Therapist began the appointment asking the client how she has been doing since last seen. Therapist used CBT to engage using active listening and positive emotional support. Therapist used CBT to engage the client to allow her time to openly discuss stressors involving family and her health that negatively impact her mood. Therapist used CBT to empathize with the client and reinforced her use of using stress management techniques. Therapist used CBT ask the client to identify her progress with frequency of use with coping skills with continued practice in her daily activity.    Please assigned client homework to practice self-care.    Plan: Return again in 5 weeks.  Diagnosis: Generalized anxiety disorder  Collaboration of Care: Patient refused AEB none requested by the client at this time  Patient/Guardian was advised Release of Information must be obtained prior to any record release in order to  collaborate their care with an outside provider. Patient/Guardian was advised if they have not already done so to contact the registration department to sign all necessary forms in order for [place] to release information regarding their care.   Consent:  Patient/Guardian gives verbal consent for treatment and assignment of benefits for services provided during this visit. Patient/Guardian expressed understanding and agreed to proceed.   Neena Rhymes Marithza Malachi, LCSW 07/24/2021

## 2021-09-29 DIAGNOSIS — Z419 Encounter for procedure for purposes other than remedying health state, unspecified: Secondary | ICD-10-CM | POA: Diagnosis not present

## 2021-10-10 ENCOUNTER — Ambulatory Visit (HOSPITAL_COMMUNITY): Payer: Medicaid Other | Admitting: Clinical

## 2021-10-17 ENCOUNTER — Other Ambulatory Visit: Payer: Self-pay | Admitting: Obstetrics and Gynecology

## 2021-10-17 NOTE — Patient Instructions (Signed)
Hi Ms. Bondar, thanks you for speaking with me today, have a nice afternoon!!  Ms. Carreto was given information about Medicaid Managed Care team care coordination services as a part of their [facility] Medicaid benefit. [name] verbally consented to engagement with the [facility] Managed Care team.   If you are experiencing a medical emergency, please call 911 or report to your local emergency department or urgent care.   If you have a non-emergency medical problem during routine business hours, please contact your provider's office and ask to speak with a nurse.   For questions related to your [facility] health plan, please call: [phone] or go here:[email or web address]  If you would like to schedule transportation through your [facility] plan, please call the following number at least 2 days in advance of your appointment: [phone].  You can also use the MTM portal or MTM mobile app to manage your rides. For the portal, please go to [email or web address]  Call the [facility] Crisis Line at [phone], at any time, 24 hours a day, 7 days a week. If you are in danger or need immediate medical attention call 911.  If you would like help to quit smoking, call 1-800-QUIT-NOW (6806407172) OR Espaol: 1-855-Djelo-Ya (5-027-741-2878) o para ms informacin haga clic aqu or Text READY to 676-720 to register via text  Ms. Langwell - following are the goals we discussed in your visit today:   Goals Addressed    Timeframe:  Long-Range Goal Priority:  High Start Date:   07/31/21                          Expected End Date:   ongoing                    Follow Up Date 11/21/21   - practice safe sex - schedule appointment for vaccines needed due to my age or health - schedule recommended health tests (blood work, mammogram, colonoscopy, pap test) - schedule and keep appointment for annual check-up    Why is this important?   Screening tests can find  diseases early when they are easier to treat.  Your doctor or nurse will talk with you about which tests are important for you.  Getting shots for common diseases like the flu and shingles will help prevent them.   10/17/21:  patient with no new appts-to schedule  appts when new insurance is active  Patient verbalizes understanding of instructions and care plan provided today and agrees to view in MyChart. Active MyChart status and patient understanding of how to access instructions and care plan via MyChart confirmed with patient.     The Managed Medicaid care management team will reach out to the patient again over the next 30 business  days.  The  Patient has been provided with contact information for the Managed Medicaid care management team and has been advised to call with any health related questions or concerns.   Kathi Der RN, BSN [place]  [facility] Care Management Coordinator - Managed Medicaid High Risk [phone]   Following is a copy of your plan of care:  Care Plan : RN Care Manager Plan of Care  Updates made by Danie Chandler, RN since 10/17/2021 12:00 AM     Problem: Chronic Disease Management and Care Coordiantion Needs for Gastroparesis and Anxiety with Tics   Priority: High     Long-Range Goal: Development of Plan of Care for  Chronic Disease Management and Care Coordination Needs (Gastroparesis, Anxiety)   Start Date: 04/27/2021  Expected End Date: 01/16/2022  Priority: High  Note:   Current Barriers:  Knowledge Deficits related to plan of care for management of Anxiety with Tics and Gastroparesis  Care Coordination needs related to Financial constraints related to food stamp reduction, Limited access to food, and Mental Health Concerns   Chronic Disease Management support and education needs related to Anxiety with Tics and Gastroparesis Financial Constraints.  10/17/21:  Patient states she has decided to stay in [place] with her sister.   Patient not taking any meds right now and is waiting for new insurance to become effective.  Needs to schedule  appt with NEURO, GI.  Patient states anxiety, depression and tics continue to be less.  Has started new job working 11-7 at [employer] at [employer].   RNCM Clinical Goal(s):  Patient will verbalize understanding of plan for management of Anxiety and Gastroparesis as evidenced by improved management of these chronic diseases. verbalize basic understanding of Anxiety and Gastroparesis disease process and self health management plan as evidenced by improved management of these chronic diseases. take all medications exactly as prescribed and will call provider for medication related questions as evidenced by being compliant with all medications    attend all scheduled medical appointments: No scheduled appointments at this time as evidenced by attending all scheduled medical appointments        demonstrate ongoing adherence to prescribed treatment plan for Anxiety and Gastroparesis as evidenced by better management of these diseases. continue to work with Medical illustrator and/or Social Worker to address care management and care coordination needs related to Anxiety and Gastroparesis as evidenced by adherence to CM Team Scheduled appointments    through collaboration with Medical illustrator, provider, and care team.   Interventions Inter-disciplinary care team collaboration (see longitudinal plan of care) Evaluation of current treatment plan related to  self management and patient's adherence to plan as established by provider  Gastroparesis  (Status:  Goal on track:  Yes.)  Long Term Goal Evaluation of current treatment plan related to  Gastroparesis , Financial constraints related to food stamp reduction, Limited access to food, and Mental Health Concerns  self-management and patient's adherence to plan as established by provider. Discussed plans with patient for ongoing care management follow  up and provided patient with direct contact information for care management team Evaluation of current treatment plan related to Gastroparesis and patient's adherence to plan as established by provider Reviewed medications with patient and discussed importance of medication compliance Reviewed scheduled/upcoming provider appointments  Anxiety with Tics  (Status:  Goal on track:  Yes.)  Long Term Goal Evaluation of current treatment plan related to Anxiety, Financial constraints related to food stamp reduction, Limited access to food, and Mental Health Concerns  self-management and patient's adherence to plan as established by provider. Discussed plans with patient for ongoing care management follow up and provided patient with direct contact information for care management team Evaluation of current treatment plan related to Anxiety with Tics and patient's adherence to plan as established by provider Advised patient to be an advocate for herself when meeting with the new [facility] Neurologist next week and inform this neurologist of patient's past experience with previous neurologist of not feeling like she was listened to.   Reviewed medications with patient and discussed importance of medication compliance Reviewed scheduled/upcoming provider appointments Patient did attend Neurologist appointment on 05/30/2021 for evaluation.  EEG was ordered and performed to rule out seizures.  EEG was negative for seizures and patient relieved to hear this result.  Patient being pro-active to prevent full blown episodes of anxiety with tics when they occur, example: when patient feels lightheadedness starting, she was stop and slow down what she is doing to prevent the episode from progressing.    Patient Goals/Self-Care Activities: Take medications as prescribed   Attend all scheduled provider appointments Call pharmacy for medication refills 3-7 days in advance of running out of medications Perform all  self care activities independently  Call provider office for new concerns or questions

## 2021-10-17 NOTE — Patient Outreach (Signed)
Medicaid Managed Care   Nurse Care Manager Note  10/17/2021 Name:  Katelyn Aguilar MRN:  [id number] DOB:  [birth year]  Katelyn Aguilar is an 21 y.o. year old female who is a primary patient of Darral Dash, DO.  The [facility] Managed Care Coordination team was consulted for assistance with:    Chronic healthcare management needs, anxiety/depression/tics, gastroparesis.  Ms. Marulanda was given information about Medicaid Managed Care Coordination team services today. Katelyn Aguilar Patient agreed to services and verbal consent obtained.  Engaged with patient by telephone for follow up visit in response to provider referral for case management and/or care coordination services.   Assessments/Interventions:  Review of past medical history, allergies, medications, health status, including review of consultants reports, laboratory and other test data, was performed as part of comprehensive evaluation and provision of chronic care management services.  SDOH (Social Determinants of Health) assessments and interventions performed: SDOH Interventions    Flowsheet Row Patient Outreach Telephone from 10/17/2021 in [facility] Coordination Patient Outreach Telephone from 09/06/2021 in [facility] Coordination Patient Outreach Telephone from 04/27/2021 in [facility] Care Coordination Chronic Care Management from 10/23/2019 in [facility]  SDOH Interventions      Food Insecurity Interventions -- Intervention Not Indicated Intervention Not Indicated Other (Comment)  [community food referral.]  Housing Interventions -- -- Intervention Not Indicated --  Transportation Interventions -- -- Intervention Not Indicated --  Utilities Interventions Intervention Not Indicated -- -- --  Depression Interventions/Treatment  -- -- Counseling, Currently on Treatment --  Financial Strain Interventions -- -- Intervention Not  Indicated --  Physical Activity Interventions -- -- Other (Comments)  [Patient states she wants to "work on this" - increase exercise.] --  Stress Interventions Intervention Not Indicated -- Other (Comment)  [Currently working with a therapist] Provide Counseling  Social Connections Interventions -- -- Intervention Not Indicated --     Care Plan  No Known Allergies  Medications Reviewed Today     Reviewed by Danie Chandler, RN (Registered Nurse) on 10/17/21 at 1243  Med List Status: <None>   Medication Order Taking? Sig Documenting Provider Last Dose Status Informant  acetaminophen (TYLENOL) 500 MG tablet [id number]  Take 1 tablet (500 mg total) by mouth every 6 (six) hours as needed.  Patient not taking: Reported on 09/06/2021   Derwood Kaplan, MD  Active   albuterol (VENTOLIN HFA) 108 (90 Base) MCG/ACT inhaler [id number]  Inhale 1-2 puffs into the lungs every 6 (six) hours as needed for wheezing or shortness of breath.  Patient not taking: Reported on 09/06/2021   Reine Just  Active   amoxicillin-clavulanate (AUGMENTIN) 875-125 MG tablet [id number]  Take 1 tablet by mouth every 12 (twelve) hours.  Patient not taking: Reported on 09/06/2021   Cheryll Cockayne, MD  Active   buPROPion (WELLBUTRIN XL) 150 MG 24 hr tablet [id number]  Take 1 tablet (150 mg total) by mouth every morning.  Patient not taking: Reported on 09/06/2021   Shanna Cisco, NP  Active   calcium carbonate (TUMS - DOSED IN MG ELEMENTAL CALCIUM) 500 MG chewable tablet [id number]  Chew 1 tablet by mouth daily as needed for indigestion or heartburn.  Patient not taking: Reported on 09/06/2021   [provider]  Active Self, Pharmacy Records  fluticasone [facility]) 50 MCG/ACT nasal spray [id number]  Place 2 sprays into both nostrils daily.  Patient not taking: Reported on 09/06/2021  Dana Allan, MD  Active   hydrOXYzine (ATARAX) 25 MG tablet [id number] Yes Take 1 tablet (25 mg total) by mouth 3 (three) times  daily as needed for anxiety. Shanna Cisco, NP Taking Active   ibuprofen (ADVIL) 600 MG tablet [id number]  Take 600 mg by mouth every 6 (six) hours as needed for headache or moderate pain.  Patient not taking: Reported on 09/06/2021   [provider]  Active Self, Pharmacy Records  levocetirizine (XYZAL) 5 MG tablet [id number]  Take 1 tablet (5 mg total) by mouth every evening.  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   GAD (generalized anxiety disorder) 10/04/2020   Ingrown toenail of both feet 08/19/2020   Pain of left breast 06/26/2020   BMI 60.0-69.9, adult (HCC) 05/19/2020   Contraception management 05/19/2020   Anxiety with depression 05/09/2020   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  Care Plan : RN Care Manager Plan of Care  Updates made by Danie Chandler, RN since 10/17/2021 12:00 AM     Problem: Chronic Disease Management and Care Coordiantion Needs for Gastroparesis and Anxiety with Tics   Priority: High     Goal: Establish Plan of Care for Chronic Disease Management Needs   Priority: High  Note:   Timeframe:  Long-Range Goal Priority:  High Start Date:   07/31/21                          Expected End  Date:   ongoing                    Follow Up Date 11/21/21   - practice safe sex - schedule appointment for vaccines needed due to my age or health - schedule recommended health tests (blood work, mammogram, colonoscopy, pap test) - schedule and keep appointment for annual check-up    Why is this important?   Screening tests can find diseases early when they are easier to treat.  Your doctor or nurse will talk with you about which tests are important for you.  Getting shots for common diseases like the flu and shingles will help prevent them.   10/17/21:  patient with no new appts-to schedule  appts when new insurance is active.   Long-Range Goal: Development of Plan of Care for Chronic Disease Management and Care Coordination Needs (Gastroparesis, Anxiety)   Start Date: 04/27/2021  Expected End Date: 01/16/2022  Priority: High  Note:   Current Barriers:  Knowledge Deficits related to plan of care for management of Anxiety with Tics and Gastroparesis  Care Coordination needs related to Financial constraints related to food stamp reduction, Limited access to food, and Mental Health Concerns   Chronic Disease  Management support and education needs related to Anxiety with Tics and Gastroparesis Financial Constraints.  10/17/21:  Patient states she has decided to stay in [place] with her sister.  Patient not taking any meds right now and is waiting for new insurance to become effective.  Needs to schedule  appt with NEURO, GI.  Patient states anxiety, depression and tics continue to be less.  Has started new job working 11-7 at [employer] at [employer].   RNCM Clinical Goal(s):  Patient will verbalize understanding of plan for management of Anxiety and Gastroparesis as evidenced by improved management of these chronic diseases. verbalize basic understanding of Anxiety and Gastroparesis disease process and self health management plan as evidenced by improved management of these chronic  diseases. take all medications exactly as prescribed and will call provider for medication related questions as evidenced by being compliant with all medications    attend all scheduled medical appointments: No scheduled appointments at this time as evidenced by attending all scheduled medical appointments        demonstrate ongoing adherence to prescribed treatment plan for Anxiety and Gastroparesis as evidenced by better management of these diseases. continue to work with Consulting civil engineer and/or Social Worker to address care management and care coordination needs related to Anxiety and Gastroparesis as evidenced by adherence to CM Team Scheduled appointments    through collaboration with Consulting civil engineer, provider, and care team.   Interventions Inter-disciplinary care team collaboration (see longitudinal plan of care) Evaluation of current treatment plan related to  self management and patient's adherence to plan as established by provider  Gastroparesis  (Status:  Goal on track:  Yes.)  Long Term Goal Evaluation of current treatment plan related to  Gastroparesis , Financial constraints related to food stamp reduction, Limited access to food, and Mental Health Concerns  self-management and patient's adherence to plan as established by provider. Discussed plans with patient for ongoing care management follow up and provided patient with direct contact information for care management team Evaluation of current treatment plan related to Gastroparesis and patient's adherence to plan as established by provider Reviewed medications with patient and discussed importance of medication compliance Reviewed scheduled/upcoming provider appointments  Anxiety with Tics  (Status:  Goal on track:  Yes.)  Long Term Goal Evaluation of current treatment plan related to Anxiety, Financial constraints related to food stamp reduction, Limited access to food, and Mental Health Concerns  self-management and patient's  adherence to plan as established by provider. Discussed plans with patient for ongoing care management follow up and provided patient with direct contact information for care management team Evaluation of current treatment plan related to Anxiety with Tics and patient's adherence to plan as established by provider Advised patient to be an advocate for herself when meeting with the new [facility] Neurologist next week and inform this neurologist of patient's past experience with previous neurologist of not feeling like she was listened to.   Reviewed medications with patient and discussed importance of medication compliance Reviewed scheduled/upcoming provider appointments Patient did attend Neurologist appointment on 05/30/2021 for evaluation.  EEG was ordered and performed to rule out seizures.  EEG was negative for seizures and patient relieved to hear this result.  Patient being pro-active to prevent full blown episodes of anxiety with tics when they occur, example: when patient feels lightheadedness starting, she was stop and slow down what she is doing to prevent the episode from progressing.    Patient Goals/Self-Care Activities: Take medications as  prescribed   Attend all scheduled provider appointments Call pharmacy for medication refills 3-7 days in advance of running out of medications Perform all self care activities independently  Call provider office for new concerns or questions    Follow Up:  Patient agrees to Care Plan and Follow-up.  Plan: The Managed Medicaid care management team will reach out to the patient again over the next 30 business  days. and The  Patient has been provided with contact information for the Managed Medicaid care management team and has been advised to call with any health related questions or concerns.  Date/time of next scheduled RN care management/care coordination outreach: 11/21/21 at 0900.

## 2021-10-26 ENCOUNTER — Telehealth (HOSPITAL_COMMUNITY): Payer: Medicaid Other | Admitting: Psychiatry

## 2021-10-29 DIAGNOSIS — Z419 Encounter for procedure for purposes other than remedying health state, unspecified: Secondary | ICD-10-CM | POA: Diagnosis not present

## 2021-11-21 ENCOUNTER — Other Ambulatory Visit: Payer: Self-pay | Admitting: Obstetrics and Gynecology

## 2021-11-21 NOTE — Patient Outreach (Signed)
  Medicaid Managed Care   Unsuccessful Outreach Note  11/21/2021 Name: Katelyn Aguilar MRN: [id number] DOB: [birth year]  Referred by: Orvis Brill, DO Reason for referral : High Risk Managed Medicaid (Unsuccessful telephone outreach)   An unsuccessful telephone outreach was attempted today. The patient was referred to the case management team for assistance with care management and care coordination.   Follow Up Plan: The care management team will reach out to the patient again over the next 30 business  days.   Aida Raider RN, BSN [place]  [facility] - Managed Medicaid High Risk [phone].

## 2021-11-21 NOTE — Patient Instructions (Signed)
Hi Johanna, hope you are doing well and sorry to have missed you this morning- as a part of your Medicaid benefit, you are eligible for care management and care coordination services at no cost or copay. I was unable to reach you by phone today but would be happy to help you with your health related needs. Please feel free to call me at [phone].  A member of the Managed Medicaid care management team will reach out to you again over the next 30 business days.   Aida Raider RN, BSN [place]  [facility] - Managed Medicaid High Risk [phone]

## 2021-11-29 DIAGNOSIS — Z419 Encounter for procedure for purposes other than remedying health state, unspecified: Secondary | ICD-10-CM | POA: Diagnosis not present

## 2021-12-20 ENCOUNTER — Other Ambulatory Visit: Payer: Medicaid Other | Admitting: Obstetrics and Gynecology

## 2021-12-20 NOTE — Patient Outreach (Signed)
    Medicaid Managed Care   Unsuccessful Attempt Note   12/20/2021 Name: Katelyn Aguilar MRN: [id number] DOB: 06-Nov-2000  Referred by: Darral Dash, DO Reason for referral : High Risk Managed Medicaid (Unsuccessful telephone outreach)  A second unsuccessful telephone outreach was attempted today. The patient was referred to the case management team for assistance with care management and care coordination.    Follow Up Plan: The Managed Medicaid care management team will reach out to the patient again over the next 30 business  days. and The  Patient has been provided with contact information for the Managed Medicaid care management team and has been advised to call with any health related questions or concerns.   Kathi Der RN, BSN [place]  [facility], production - Managed Medicaid High Risk [phone]

## 2021-12-20 NOTE — Patient Instructions (Signed)
Hi Lumina, I hope you are doing well, sorry to have missed you today - as a part of your Medicaid benefit, you are eligible for care management and care coordination services at no cost or copay. I was unable to reach you by phone today but would be happy to help you with your health related needs. Please feel free to call me at [phone].  A member of the Managed Medicaid care management team will reach out to you again over the next 30 business  days.   Kathi Der RN, BSN [place]  [facility] - Managed Medicaid High Risk [phone]

## 2021-12-29 DIAGNOSIS — Z419 Encounter for procedure for purposes other than remedying health state, unspecified: Secondary | ICD-10-CM | POA: Diagnosis not present

## 2022-01-26 ENCOUNTER — Other Ambulatory Visit: Payer: Medicaid Other | Admitting: Obstetrics and Gynecology

## 2022-01-26 NOTE — Patient Outreach (Cosign Needed)
Care Coordination  01/26/2022  Katelyn Aguilar 19-Feb-2000 [id number]   Medicaid Managed Care   Unsuccessful Outreach Note  01/26/2022 Name: Katelyn Aguilar MRN: [id number] DOB: 01/20/2001  Referred by: Darral Dash, DO Reason for referral : High Risk Managed Medicaid (Unsuccessful telephone outreach)   Third unsuccessful telephone outreach was attempted.  The patient was referred to the case management team for assistance with care management and care coordination. The patient's primary care provider has been notified of our unsuccessful attempts to make or maintain contact with the patient. The care management team is pleased to engage with this patient at any time in the future should he/she be interested in assistance from the care management team.   Follow Up Plan: We have been unable to make contact with the patient for follow up. The care management team is available to follow up with the patient after provider conversation with the patient regarding recommendation for care management engagement and subsequent re-referral to the care management team.   Kathi Der RN, BSN [place]  [facility] Care Management Coordinator - Managed [place] High Risk [phone]

## 2022-01-26 NOTE — Patient Instructions (Signed)
Visit Information  Ms. Katelyn Aguilar  - as a part of your Medicaid benefit, you are eligible for care management and care coordination services at no cost or copay. We have been  unable to reach you by phone but would be happy to help you with your health related needs. Please feel free to call me at [phone]  Kathi Der RN, BSN [place]  [facility] Care Management Coordinator - Managed [place] High Risk [phone]

## 2022-01-29 DIAGNOSIS — Z419 Encounter for procedure for purposes other than remedying health state, unspecified: Secondary | ICD-10-CM | POA: Diagnosis not present

## 2022-03-01 DIAGNOSIS — Z419 Encounter for procedure for purposes other than remedying health state, unspecified: Secondary | ICD-10-CM | POA: Diagnosis not present

## 2022-03-30 DIAGNOSIS — Z419 Encounter for procedure for purposes other than remedying health state, unspecified: Secondary | ICD-10-CM | POA: Diagnosis not present

## 2022-04-30 DIAGNOSIS — Z419 Encounter for procedure for purposes other than remedying health state, unspecified: Secondary | ICD-10-CM | POA: Diagnosis not present

## 2022-05-11 IMAGING — CT CT HEAD W/O CM
4 series · 17 of 47 positions shown, 19 images · non-contrast
Comparison: 09/28/2018

CLINICAL DATA: Headache



[Series 3: head without · axial · non-contrast · 0.43mm/px · z∈[-170,-45]mm · 7 of 35 slices shown, 9 images]
[im 5/35  brain]
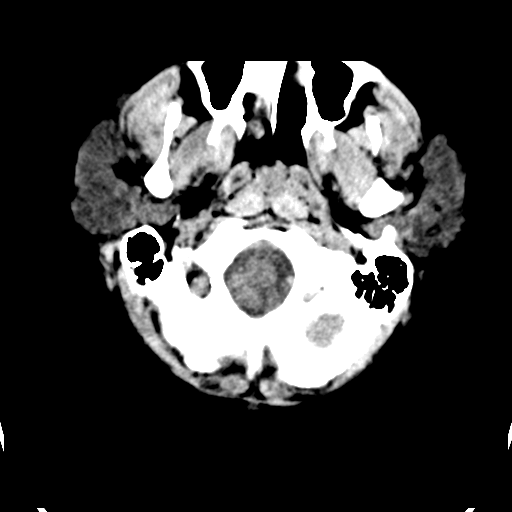
[im 5/35  bone]
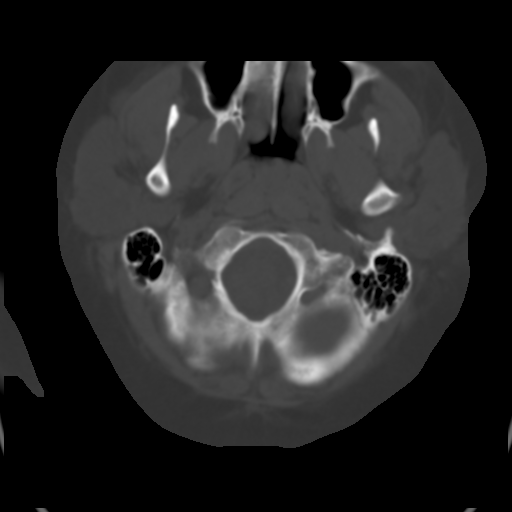
[im 9/35  brain]
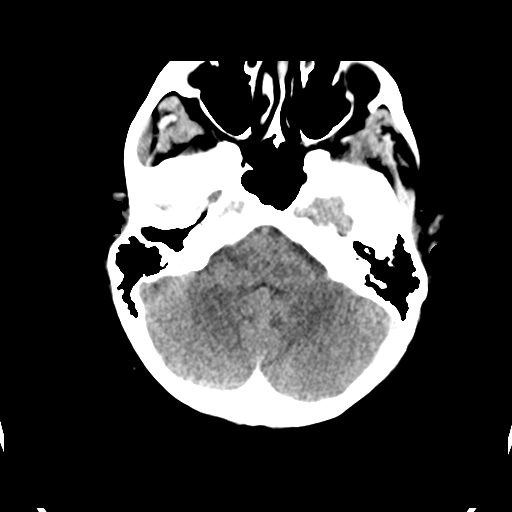
[im 13/35  brain]
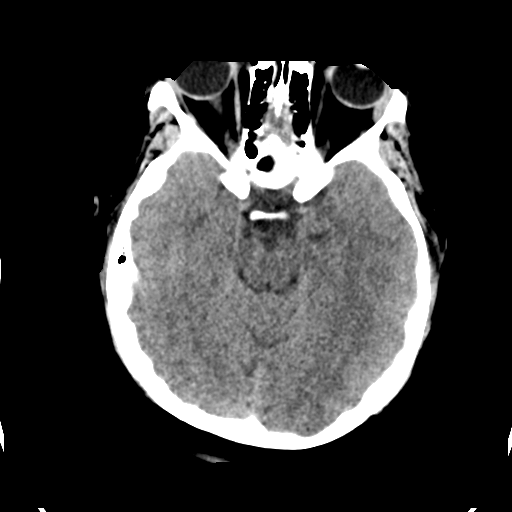
[im 18/35  brain]
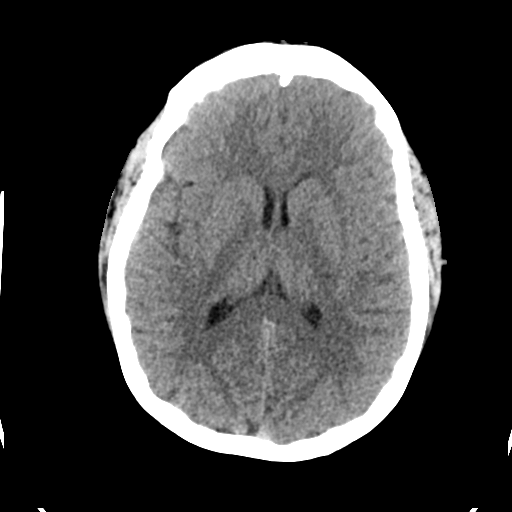
[im 22/35  brain]
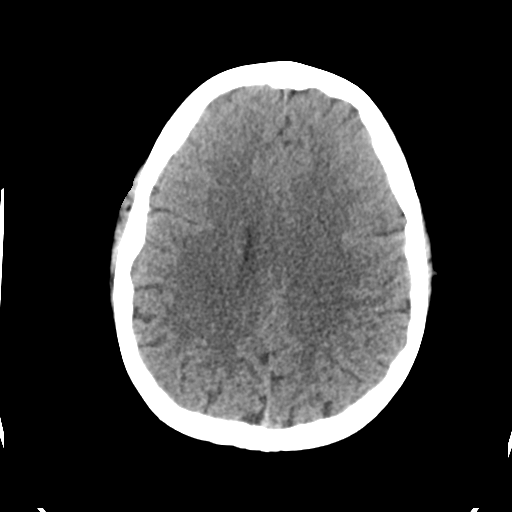
[im 22/35  bone]
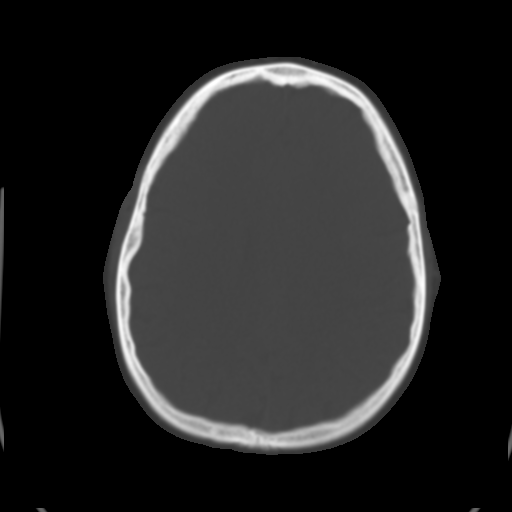
[im 26/35  brain]
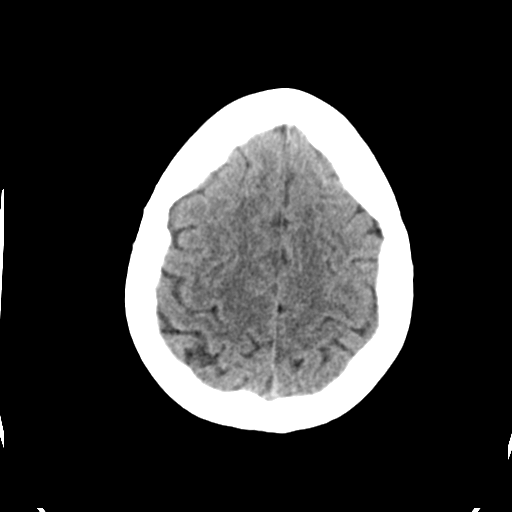
[im 30/35  brain]
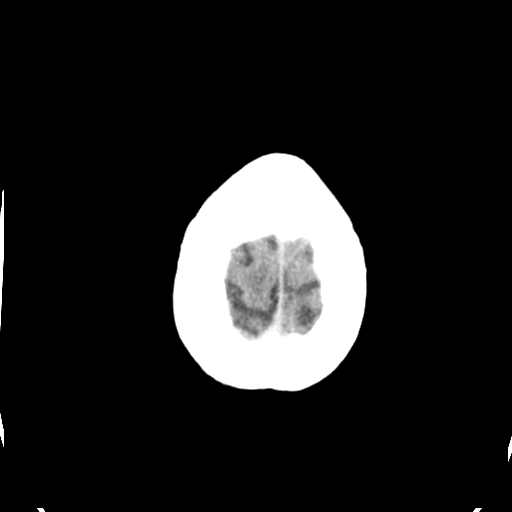

[Series 4: head bone · axial · 0.43mm/px · z∈[-174,-112]mm · 4 of 88 slices shown]
[im 9/88  bone]
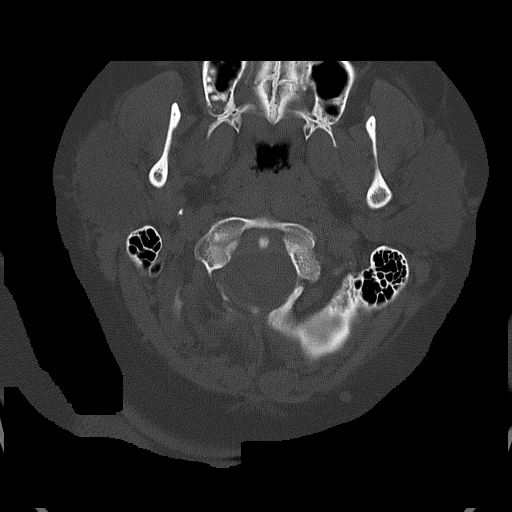
[im 18/88  bone]
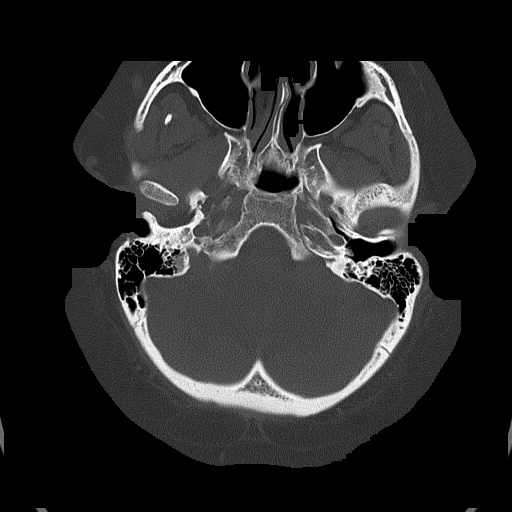
[im 27/88  bone]
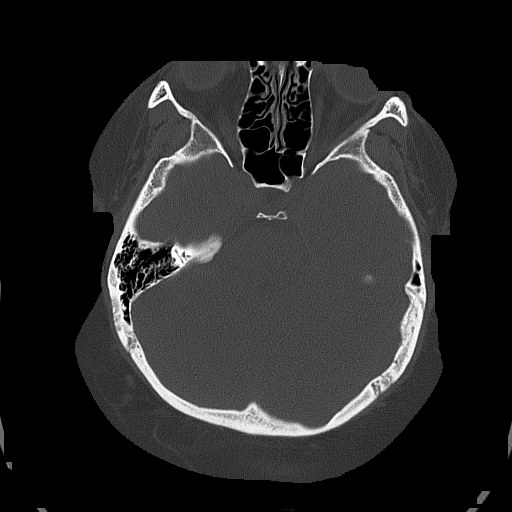
[im 40/88  bone]
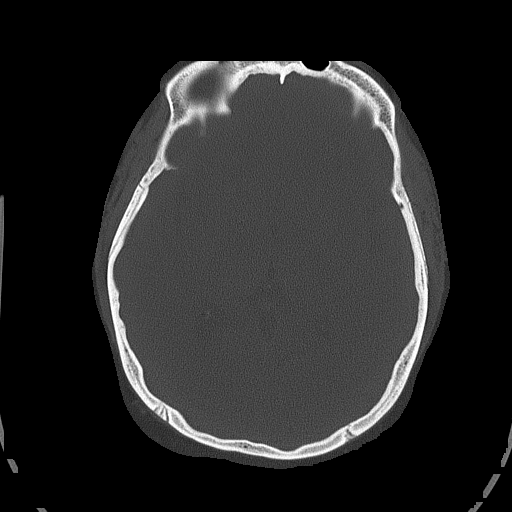

[Series 5: head without cor · coronal · non-contrast · 0.34mm/px · 3 of 69 slices shown]
[im 23/69  brain]
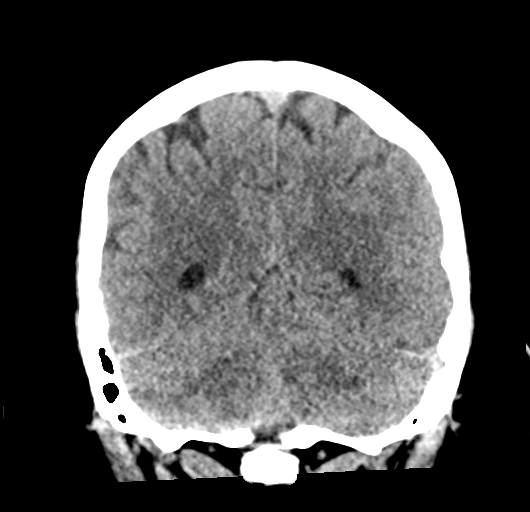
[im 31/69  brain]
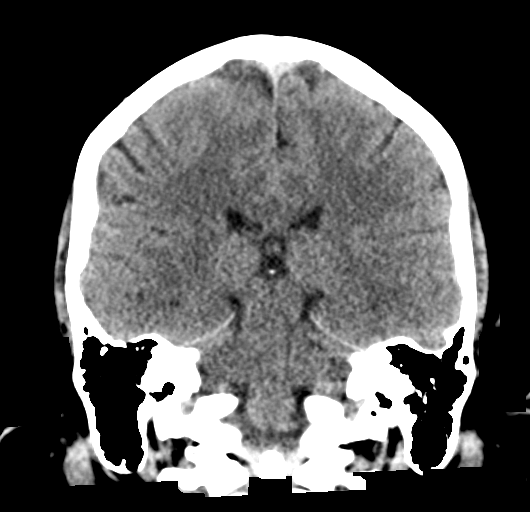
[im 38/69  brain]
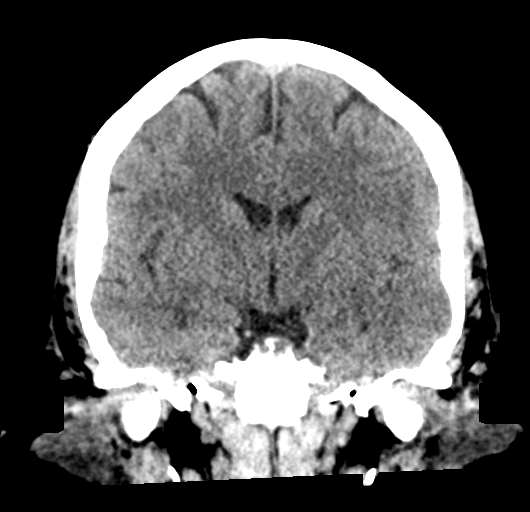

[Series 6: head without sag · sagittal · non-contrast · 0.35mm/px · 3 of 60 slices shown]
[im 20/60  brain]
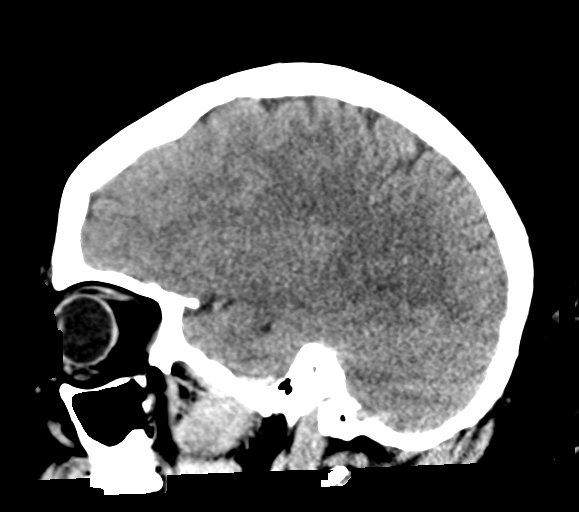
[im 30/60  brain]
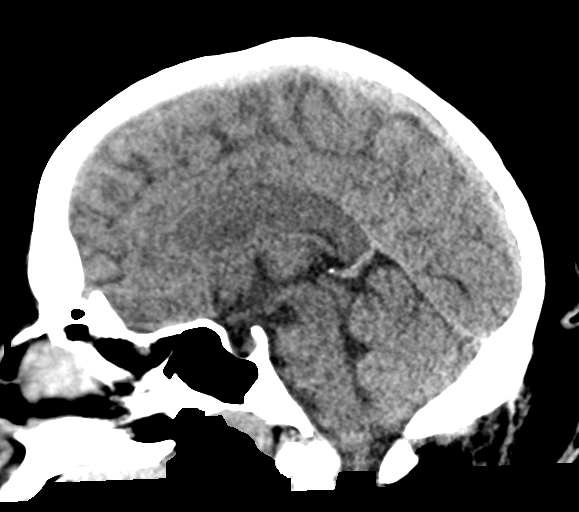
[im 40/60  brain]
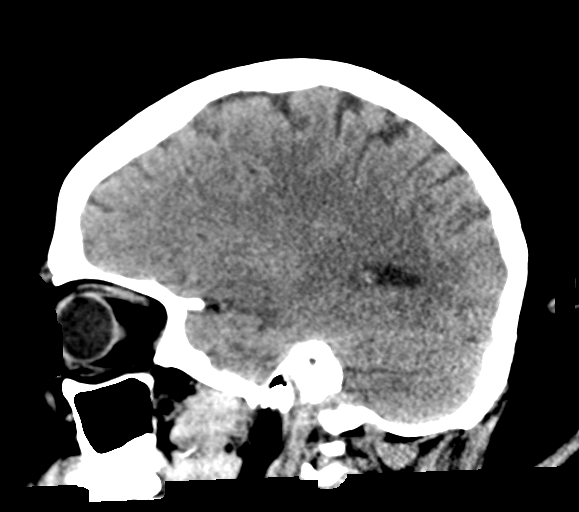

[17 of 47 positions shown; findings below may reference images not displayed]

FINDINGS: Brain: There is no mass, hemorrhage or extra-axial collection. The
size and configuration of the ventricles and extra-axial CSF spaces
are normal. The brain parenchyma is normal, without acute or chronic
infarction.

Vascular: No abnormal hyperdensity of the major intracranial
arteries or dural venous sinuses. No intracranial atherosclerosis.

Skull: The visualized skull base, calvarium and extracranial soft
tissues are normal.

Sinuses/Orbits: No fluid levels or advanced mucosal thickening of
the visualized paranasal sinuses. No mastoid or middle ear effusion.
The orbits are normal.
IMPRESSION: Normal head CT.

## 2022-05-30 DIAGNOSIS — Z419 Encounter for procedure for purposes other than remedying health state, unspecified: Secondary | ICD-10-CM | POA: Diagnosis not present

## 2022-06-30 DIAGNOSIS — Z419 Encounter for procedure for purposes other than remedying health state, unspecified: Secondary | ICD-10-CM | POA: Diagnosis not present

## 2023-07-09 ENCOUNTER — Encounter: Payer: Self-pay | Admitting: *Deleted
